# Patient Record
Sex: Female | Born: 1965 | Race: Black or African American | Hispanic: No | Marital: Single | Smoking: Never smoker
Health system: Southern US, Community
[De-identification: ages and names within clinical notes are randomized; demographics above are authoritative.]

## PROBLEM LIST (undated history)

## (undated) DIAGNOSIS — I10 Essential (primary) hypertension: Secondary | ICD-10-CM

## (undated) DIAGNOSIS — E119 Type 2 diabetes mellitus without complications: Secondary | ICD-10-CM

## (undated) DIAGNOSIS — I509 Heart failure, unspecified: Secondary | ICD-10-CM

## (undated) HISTORY — DX: Type 2 diabetes mellitus without complications: E11.9

## (undated) HISTORY — DX: Heart failure, unspecified: I50.9

## (undated) SURGERY — Surgical Case
Anesthesia: *Unknown

---

## 2012-05-02 ENCOUNTER — Telehealth (HOSPITAL_COMMUNITY): Payer: Self-pay | Admitting: Emergency Medicine

## 2012-05-02 ENCOUNTER — Emergency Department (HOSPITAL_BASED_OUTPATIENT_CLINIC_OR_DEPARTMENT_OTHER)
Admission: EM | Admit: 2012-05-02 | Discharge: 2012-05-02 | Disposition: A | Discharge status: Home / Self Care | Payer: BC Managed Care – PPO | Attending: Emergency Medicine | Admitting: Emergency Medicine

## 2012-05-02 ENCOUNTER — Encounter (HOSPITAL_BASED_OUTPATIENT_CLINIC_OR_DEPARTMENT_OTHER): Payer: Self-pay | Admitting: *Deleted

## 2012-05-02 DIAGNOSIS — Z79899 Other long term (current) drug therapy: Secondary | ICD-10-CM | POA: Insufficient documentation

## 2012-05-02 DIAGNOSIS — A6000 Herpesviral infection of urogenital system, unspecified: Secondary | ICD-10-CM

## 2012-05-02 DIAGNOSIS — I10 Essential (primary) hypertension: Secondary | ICD-10-CM | POA: Insufficient documentation

## 2012-05-02 DIAGNOSIS — E119 Type 2 diabetes mellitus without complications: Secondary | ICD-10-CM | POA: Insufficient documentation

## 2012-05-02 DIAGNOSIS — N760 Acute vaginitis: Secondary | ICD-10-CM | POA: Insufficient documentation

## 2012-05-02 DIAGNOSIS — B9689 Other specified bacterial agents as the cause of diseases classified elsewhere: Secondary | ICD-10-CM

## 2012-05-02 HISTORY — DX: Essential (primary) hypertension: I10

## 2012-05-02 HISTORY — DX: Type 2 diabetes mellitus without complications: E11.9

## 2012-05-02 LAB — WET PREP, GENITAL
Trich, Wet Prep: NONE SEEN
Yeast Wet Prep HPF POC: NONE SEEN

## 2012-05-02 LAB — GC/CHLAMYDIA PROBE AMP
CT Probe RNA: NEGATIVE
GC Probe RNA: NEGATIVE

## 2012-05-02 MED ORDER — METRONIDAZOLE 500 MG PO TABS: 500.0000 mg | ORAL_TABLET | Freq: Two times a day (BID) | ORAL | Status: DC

## 2012-05-02 MED ORDER — LIDOCAINE 4 % EX CREA: TOPICAL_CREAM | Freq: Four times a day (QID) | CUTANEOUS | Status: DC | PRN

## 2012-05-02 MED ORDER — VALACYCLOVIR HCL 1 G PO TABS: 1000.0000 mg | ORAL_TABLET | Freq: Three times a day (TID) | ORAL | Status: AC

## 2012-05-02 MED ORDER — VALACYCLOVIR HCL 500 MG PO TABS
1000.0000 mg | ORAL_TABLET | Freq: Once | ORAL | Status: DC
  Filled 2012-05-02: qty 2

## 2012-05-02 MED ORDER — LIDOCAINE-EPINEPHRINE-TETRACAINE (LET) SOLUTION
3.0000 mL | Freq: Once | NASAL | Status: AC
  Administered 2012-05-02: 3 mL via TOPICAL
  Filled 2012-05-02: qty 3

## 2012-05-02 MED ORDER — LIDOCAINE VISCOUS 2 % MT SOLN
OROMUCOSAL | Status: AC
  Filled 2012-05-02: qty 15

## 2012-05-02 NOTE — ED Notes (Addendum)
C/o right groin area "boil" that started on Friday. Pt. States she is diabetic. Denies hx of abscess. Denies drainage from wound. Denies fevers. States pain to area with urination.

## 2012-05-02 NOTE — ED Provider Notes (Signed)
History  This chart was scribed for Natasha Skene, MD by Shari Heritage, ED Scribe. The patient was seen in room MH11/MH11. Patient's care was started at 0032.   CSN: [id number]  Arrival date & time 05/02/12  0005   First MD Initiated Contact with Patient 05/02/12 0032      Chief Complaint  Patient presents with  . rash      The history is provided by the patient. No language interpreter was used.    HPI Comments: Natasha Hudson is a 47 y.o. female who presents to the Emergency Department complaining of a painful rash to the right gluteal-labial area onset 2-3 days ago. Patient describes pain as burning and rates it as 7-8/10. She denies history of the same. She has applied antibiotic ointment to the area with no relief. She has not applied a warm compress to the area, but states that pain is improved with warm shower. She states that she is sexually active and has been with the same partner for awhile. She denies fever. No vaginal discharge, vaginal bleeding, or urinary symptoms. Her medical history includes diabetes and hypertension. She does not smoke.  Past Medical History  Diagnosis Date  . Diabetes mellitus without complication   . Hypertension     History reviewed. No pertinent past surgical history.  No family history on file.  History  Substance Use Topics  . Smoking status: Never Smoker   . Smokeless tobacco: Not on file  . Alcohol Use: No    OB History   Grav Para Term Preterm Abortions TAB SAB Ect Mult Living                  Review of Systems At least 10pt or greater review of systems completed and are negative except where specified in the HPI.  Allergies  Review of patient's allergies indicates no known allergies.  Home Medications   Current Outpatient Rx  Name  Route  Sig  Dispense  Refill  . hydrochlorothiazide (HYDRODIURIL) 25 MG tablet   Oral   Take 25 mg by mouth daily.         . metFORMIN (GLUCOPHAGE) 500 MG tablet   Oral   Take 1,000  mg by mouth 2 (two) times daily with a meal.           Triage Vitals: BP 141/85  Pulse 90  Temp(Src) 98 F (36.7 C)  Resp 18  Ht 5\' 5"  (1.651 m)  Wt 140 lb (63.504 kg)  BMI 23.3 kg/m2  SpO2 100%  LMP 04/09/2012  Physical Exam  Genitourinary:       Nursing notes reviewed.  Electronic medical record reviewed. VITAL SIGNS:   Filed Vitals:   05/02/12 0020 05/02/12 0021  BP:  141/85  Pulse:  90  Temp:  98 F (36.7 C)  Resp:  18  Height: 5\' 5"  (1.651 m)   Weight: 140 lb (63.504 kg)   SpO2:  100%   CONSTITUTIONAL: Awake, oriented, appears non-toxic HENT: Atraumatic, normocephalic, oral mucosa pink and moist, airway patent. Some gold teeth.Nares patent without drainage. External ears normal. EYES: Conjunctiva clear, EOMI, PERRLA NECK: Trachea midline, non-tender, supple CARDIOVASCULAR: Normal heart rate, Normal rhythm, No murmurs, rubs, gallops PULMONARY/CHEST: Clear to auscultation, no rhonchi, wheezes, or rales. Symmetrical breath sounds. Non-tender. ABDOMINAL: Non-distended, soft, non-tender - no rebound or guarding.  BS normal. NEUROLOGIC: Non-focal, moving all four extremities, no gross sensory or motor deficits. EXTREMITIES: No clubbing, cyanosis, or edema SKIN: Warm, Dry, No erythema,  No rash PELVIC EXAM: on the external. The labia majora there are ruptured vesicles with an erythematous and painful base, normal vulva, vagina, cervix, uterus and adnexa - ovaries palpated normally-no masses, no cervical motion tenderness or adnexal tenderness appreciated.  ED Course  Procedures (including critical care time) DIAGNOSTIC STUDIES: Oxygen Saturation is 100% on room air, normal by my interpretation.    COORDINATION OF CARE: 12:37 AM- Patient informed of current plan for treatment and evaluation and agrees with plan at this time.    Labs Reviewed  WET PREP, GENITAL - Abnormal; Notable for the following:    Clue Cells Wet Prep HPF POC MANY (*)    WBC, Wet Prep HPF  POC MANY (*)    All other components within normal limits  GC/CHLAMYDIA PROBE AMP  RAPID HIV SCREEN ([facility])  RPR    No results found.   1. Genital herpes   2. BV (bacterial vaginosis)       MDM  Natasha Hudson is a 47 y.o. female History of hypertension and diabetes on oral meds presents with what she thought was an abscess to her vulvar region. Patient has an unexpected initial outbreak of genital herpes. We'll screen the patient for other sexual transmitted infections. Pelvic exam is unremarkable, wet prep does suggest possible bacterial vaginosis-patient has had some vaginal itching it is uncertain whether not this is secondary to her vulvar herpes, we'll treat her with metronidazole.    I discussed further treatments are pending her laboratory tests. We'll call her for any positive for GC chlamydia or RPR. Initial rapid HIV testing is negative. Told patient she can followup with her primary care physician if she desires any further testing.  Patient understands and accepts the medical plan as it's been dictated, she understands to take her valacyclovir 1000 mg 3 times daily for one week. Also given her some topical lidocaine cream to help with the discomfort. Instructed her to use it 4-5 times a day to not overuse it.  I explained the diagnosis and have given explicit precautions to return to the ER including any other new or worsening symptoms. The patient understands and accepts the medical plan as it's been dictated and I have answered their questions. Discharge instructions concerning home care and prescriptions have been given as dictated.  The patient is STABLE and is discharged to home in good condition.    I personally performed the services described in this documentation, which was scribed in my presence. The recorded information has been reviewed and is accurate. Natasha Hudson, M.D.     Natasha Skene, MD 05/02/12 [phone]

## 2012-05-02 NOTE — ED Notes (Signed)
This assessment was taken at 0240

## 2012-05-02 NOTE — ED Notes (Signed)
Pt presented to ED requesting a return to work note for tomorrow. Note provided.

## 2012-10-04 ENCOUNTER — Emergency Department (HOSPITAL_BASED_OUTPATIENT_CLINIC_OR_DEPARTMENT_OTHER): Payer: BC Managed Care – PPO

## 2012-10-04 ENCOUNTER — Encounter (HOSPITAL_BASED_OUTPATIENT_CLINIC_OR_DEPARTMENT_OTHER): Payer: Self-pay

## 2012-10-04 ENCOUNTER — Emergency Department (HOSPITAL_BASED_OUTPATIENT_CLINIC_OR_DEPARTMENT_OTHER)
Admission: EM | Admit: 2012-10-04 | Discharge: 2012-10-04 | Disposition: A | Discharge status: Home / Self Care | Payer: BC Managed Care – PPO | Attending: Emergency Medicine | Admitting: Emergency Medicine

## 2012-10-04 DIAGNOSIS — S8392XA Sprain of unspecified site of left knee, initial encounter: Secondary | ICD-10-CM

## 2012-10-04 DIAGNOSIS — Z79899 Other long term (current) drug therapy: Secondary | ICD-10-CM | POA: Insufficient documentation

## 2012-10-04 DIAGNOSIS — IMO0002 Reserved for concepts with insufficient information to code with codable children: Secondary | ICD-10-CM | POA: Insufficient documentation

## 2012-10-04 DIAGNOSIS — Y9301 Activity, walking, marching and hiking: Secondary | ICD-10-CM | POA: Insufficient documentation

## 2012-10-04 DIAGNOSIS — I10 Essential (primary) hypertension: Secondary | ICD-10-CM | POA: Insufficient documentation

## 2012-10-04 DIAGNOSIS — X503XXA Overexertion from repetitive movements, initial encounter: Secondary | ICD-10-CM | POA: Insufficient documentation

## 2012-10-04 DIAGNOSIS — Y929 Unspecified place or not applicable: Secondary | ICD-10-CM | POA: Insufficient documentation

## 2012-10-04 DIAGNOSIS — E119 Type 2 diabetes mellitus without complications: Secondary | ICD-10-CM | POA: Insufficient documentation

## 2012-10-04 MED ORDER — IBUPROFEN 800 MG PO TABS: 800.0000 mg | ORAL_TABLET | Freq: Three times a day (TID) | ORAL | Status: DC

## 2012-10-04 NOTE — ED Provider Notes (Signed)
History/physical exam/procedure(s) were performed by non-physician practitioner and as supervising physician I was immediately available for consultation/collaboration. I have reviewed all notes and am in agreement with care and plan.   Kerryann Allaire S Shalece Staffa, MD 10/04/12 1601 

## 2012-10-04 NOTE — ED Notes (Signed)
Patient transported to & from X-ray. 

## 2012-10-04 NOTE — ED Provider Notes (Signed)
CSN: [id number]     Arrival date & time 10/04/12  1106 History     First MD Initiated Contact with Patient 10/04/12 1205     Chief Complaint  Patient presents with  . Knee Pain   (Consider location/radiation/quality/duration/timing/severity/associated sxs/prior Treatment) Patient is a 47 y.o. female presenting with knee pain. The history is provided by the patient. No language interpreter was used.  Knee Pain Location:  Knee Time since incident:  2 hours Injury: yes   Knee location:  L knee Pain details:    Quality:  Aching   Severity:  Moderate   Onset quality:  Gradual   Timing:  Constant   Progression:  Worsening Chronicity:  New Relieved by:  Nothing Worsened by:  Nothing tried Ineffective treatments:  None tried Pt reports she heard a pop 2 days ago, now knee is swollen.   Past Medical History  Diagnosis Date  . Diabetes mellitus without complication   . Hypertension    History reviewed. No pertinent past surgical history. No family history on file. History  Substance Use Topics  . Smoking status: Never Smoker   . Smokeless tobacco: Not on file  . Alcohol Use: No   OB History   Grav Para Term Preterm Abortions TAB SAB Ect Mult Living                 Review of Systems  Musculoskeletal: Positive for myalgias and joint swelling.  All other systems reviewed and are negative.    Allergies  Review of patient's allergies indicates no known allergies.  Home Medications   Current Outpatient Rx  Name  Route  Sig  Dispense  Refill  . glyBURIDE (DIABETA) 5 MG tablet   Oral   Take 5 mg by mouth 2 (two) times daily with a meal.         . hydrochlorothiazide (HYDRODIURIL) 25 MG tablet   Oral   Take 25 mg by mouth daily.         Marland Kitchen lidocaine (LMX) 4 % cream   Topical   Apply topically 4 (four) times daily as needed. For pain   5 g   1   . metFORMIN (GLUCOPHAGE) 500 MG tablet   Oral   Take 1,000 mg by mouth 2 (two) times daily with a meal.          . metroNIDAZOLE (FLAGYL) 500 MG tablet   Oral   Take 1 tablet (500 mg total) by mouth 2 (two) times daily.   14 tablet   0    BP 146/76  Pulse 72  Temp(Src) 98.2 F (36.8 C) (Oral)  Resp 20  Ht 5\' 5"  (1.651 m)  Wt 165 lb (74.844 kg)  BMI 27.46 kg/m2  SpO2 100%  LMP 10/03/2012 Physical Exam  Nursing note and vitals reviewed. Constitutional: She is oriented to person, place, and time. She appears well-developed and well-nourished.  Musculoskeletal: She exhibits tenderness.  Swollen left knee, decreased range of motion,  nv and ns intact. No instability  Neurological: She is alert and oriented to person, place, and time. She has normal reflexes.  Skin: Skin is warm.  Psychiatric: She has a normal mood and affect.    ED Course   Procedures (including critical care time)  Labs Reviewed - No data to display No results found. 1. Knee sprain and strain, left, initial encounter     MDM  Knee imbolizer and ibuprofen,   Schedule to see Dr. Pearletha Forge for recheck  Verlon Au  Verline Lema, PA-C 10/04/12 1229  Elson Areas, PA-C 10/04/12 1229

## 2012-10-04 NOTE — ED Notes (Signed)
Pt states she felt a pop in her knee while walking Sunday. Pain and swelling in left knee. Pt states it feels worse when walking.

## 2012-10-04 NOTE — ED Notes (Signed)
C/o pain/swelling to left knee-turned and felt a pop 2 days ago-pt with steady gait into triage

## 2013-04-07 DIAGNOSIS — J302 Other seasonal allergic rhinitis: Secondary | ICD-10-CM | POA: Insufficient documentation

## 2013-11-21 DIAGNOSIS — M1712 Unilateral primary osteoarthritis, left knee: Secondary | ICD-10-CM | POA: Insufficient documentation

## 2013-12-11 DIAGNOSIS — G4730 Sleep apnea, unspecified: Secondary | ICD-10-CM | POA: Insufficient documentation

## 2014-09-09 IMAGING — CR DG KNEE 1-2V*L*
2 series · 2 of 2 positions shown · non-contrast
Comparison: None.

CLINICAL DATA: Anterior left knee pain and swelling for 3 days

LEFT KNEE - 1-2 VIEW

[t knee ap left]
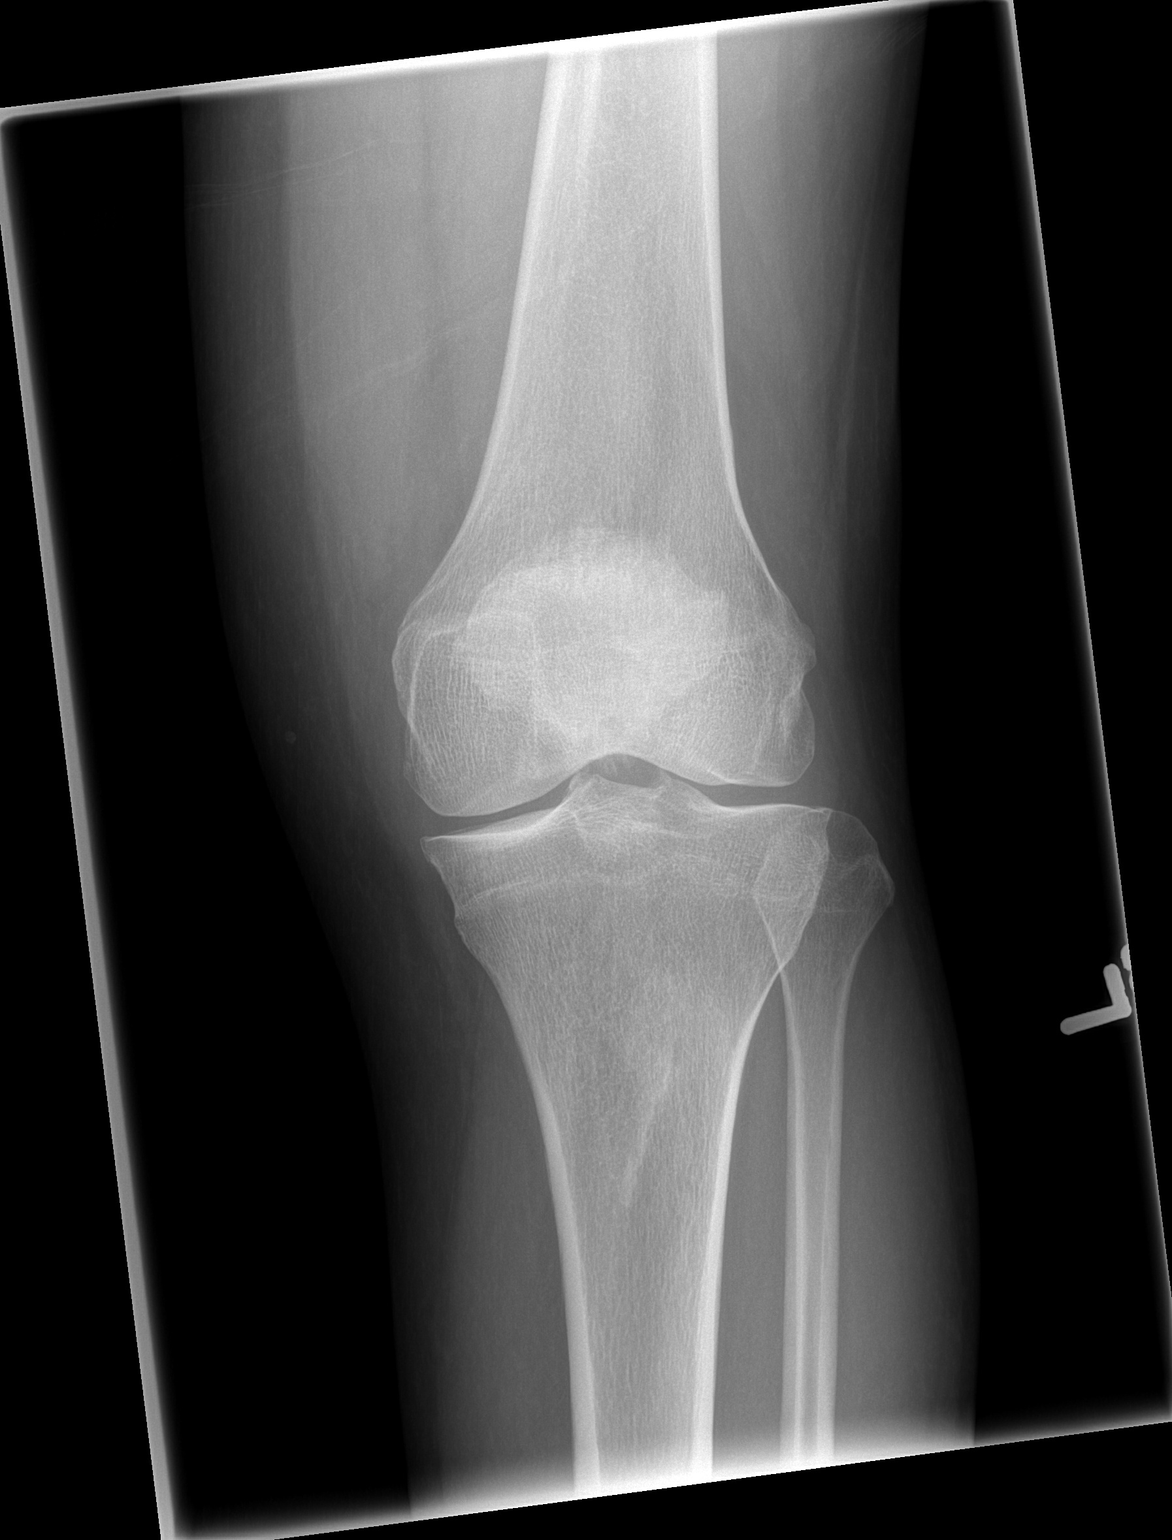

[t knee lat left]
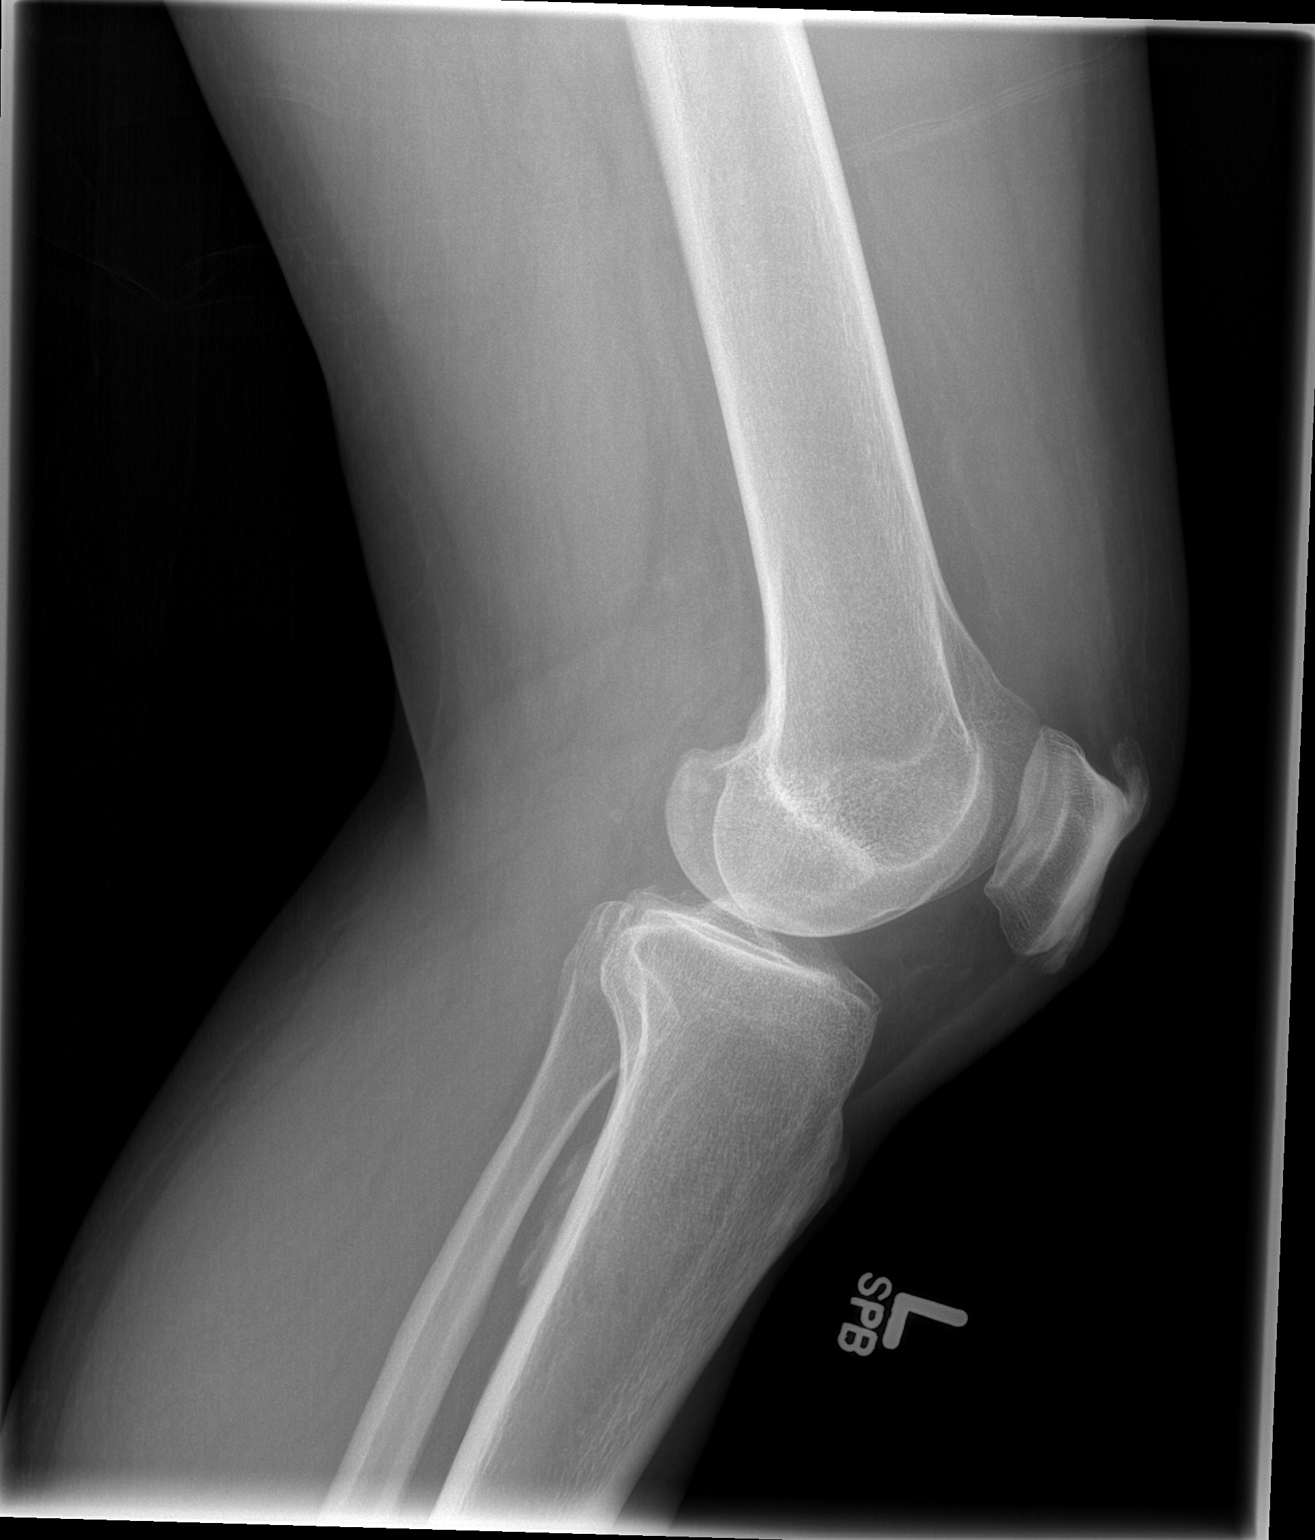

[2 of 2 positions shown; findings below may reference images not displayed]

FINDINGS: Two views of the left knee submitted.  No acute fracture
or subluxation.  Small joint effusion.  Significant spurring of the
patella.  Narrowing of patellofemoral joint space.
IMPRESSION: No acute fracture or subluxation.  Significant spurring of patella
at the insertion of the quadriceps tendon and patellar tendon.
Small joint effusion.

## 2014-11-25 DIAGNOSIS — Z91199 Patient's noncompliance with other medical treatment and regimen due to unspecified reason: Secondary | ICD-10-CM | POA: Insufficient documentation

## 2015-02-06 DIAGNOSIS — J4520 Mild intermittent asthma, uncomplicated: Secondary | ICD-10-CM | POA: Insufficient documentation

## 2015-10-25 DIAGNOSIS — L603 Nail dystrophy: Secondary | ICD-10-CM | POA: Insufficient documentation

## 2016-02-07 DIAGNOSIS — M1711 Unilateral primary osteoarthritis, right knee: Secondary | ICD-10-CM | POA: Insufficient documentation

## 2016-03-10 DIAGNOSIS — K219 Gastro-esophageal reflux disease without esophagitis: Secondary | ICD-10-CM | POA: Insufficient documentation

## 2016-03-10 DIAGNOSIS — R490 Dysphonia: Secondary | ICD-10-CM | POA: Insufficient documentation

## 2016-11-03 DIAGNOSIS — J383 Other diseases of vocal cords: Secondary | ICD-10-CM | POA: Insufficient documentation

## 2018-04-18 DIAGNOSIS — Z0000 Encounter for general adult medical examination without abnormal findings: Secondary | ICD-10-CM | POA: Insufficient documentation

## 2019-01-09 DIAGNOSIS — E559 Vitamin D deficiency, unspecified: Secondary | ICD-10-CM | POA: Insufficient documentation

## 2019-05-14 ENCOUNTER — Ambulatory Visit: Payer: 59 | Attending: Internal Medicine

## 2019-05-14 DIAGNOSIS — Z23 Encounter for immunization: Secondary | ICD-10-CM | POA: Insufficient documentation

## 2019-05-14 NOTE — Progress Notes (Signed)
   Covid-19 Vaccination Clinic  Name:  Carrieann Spielberg    MRN: [id number] DOB: 1965/07/12  05/14/2019  Ms. Shepherd was observed post Covid-19 immunization for 15 minutes without incident. She was provided with Vaccine Information Sheet and instruction to access the V-Safe system.   Ms. Vaca was instructed to call 911 with any severe reactions post vaccine: Marland Kitchen Difficulty breathing  . Swelling of face and throat  . A fast heartbeat  . A bad rash all over body  . Dizziness and weakness   Immunizations Administered    Name Date Dose VIS Date Route   Pfizer COVID-19 Vaccine 05/14/2019  4:45 PM 0.3 mL 02/17/2019 Intramuscular   Manufacturer: [employer], [employer]   Lot: [id number]   NDC: [address]-8

## 2019-06-13 ENCOUNTER — Ambulatory Visit: Payer: 59 | Attending: Internal Medicine

## 2019-06-13 DIAGNOSIS — Z23 Encounter for immunization: Secondary | ICD-10-CM

## 2019-06-13 NOTE — Progress Notes (Signed)
   Covid-19 Vaccination Clinic  Name:  Natasha Hudson    MRN: [id number] DOB: [birth year]  06/13/2019  Natasha Hudson was observed post Covid-19 immunization for 15 minutes without incident. She was provided with Vaccine Information Sheet and instruction to access the V-Safe system.   Natasha Hudson was instructed to call 911 with any severe reactions post vaccine: Marland Kitchen Difficulty breathing  . Swelling of face and throat  . A fast heartbeat  . A bad rash all over body  . Dizziness and weakness   Immunizations Administered    Name Date Dose VIS Date Route   Pfizer COVID-19 Vaccine 06/13/2019  3:20 PM 0.3 mL 02/17/2019 Intramuscular   Manufacturer: [employer], [employer]   Lot: [id number]   NDC: [address]-9

## 2020-03-15 ENCOUNTER — Ambulatory Visit: Payer: 59

## 2020-08-13 ENCOUNTER — Emergency Department (HOSPITAL_BASED_OUTPATIENT_CLINIC_OR_DEPARTMENT_OTHER)
Admission: EM | Admit: 2020-08-13 | Discharge: 2020-08-13 | Disposition: A | Discharge status: Home / Self Care | Payer: BC Managed Care – PPO | Attending: Emergency Medicine | Admitting: Emergency Medicine

## 2020-08-13 ENCOUNTER — Emergency Department (HOSPITAL_BASED_OUTPATIENT_CLINIC_OR_DEPARTMENT_OTHER): Payer: BC Managed Care – PPO

## 2020-08-13 ENCOUNTER — Encounter (HOSPITAL_BASED_OUTPATIENT_CLINIC_OR_DEPARTMENT_OTHER): Payer: Self-pay | Admitting: Emergency Medicine

## 2020-08-13 ENCOUNTER — Other Ambulatory Visit: Payer: Self-pay

## 2020-08-13 DIAGNOSIS — Z7984 Long term (current) use of oral hypoglycemic drugs: Secondary | ICD-10-CM | POA: Insufficient documentation

## 2020-08-13 DIAGNOSIS — E119 Type 2 diabetes mellitus without complications: Secondary | ICD-10-CM | POA: Insufficient documentation

## 2020-08-13 DIAGNOSIS — S92514A Nondisplaced fracture of proximal phalanx of right lesser toe(s), initial encounter for closed fracture: Secondary | ICD-10-CM | POA: Insufficient documentation

## 2020-08-13 DIAGNOSIS — I10 Essential (primary) hypertension: Secondary | ICD-10-CM | POA: Insufficient documentation

## 2020-08-13 DIAGNOSIS — Z79899 Other long term (current) drug therapy: Secondary | ICD-10-CM | POA: Insufficient documentation

## 2020-08-13 DIAGNOSIS — W228XXA Striking against or struck by other objects, initial encounter: Secondary | ICD-10-CM | POA: Diagnosis not present

## 2020-08-13 DIAGNOSIS — S99921A Unspecified injury of right foot, initial encounter: Secondary | ICD-10-CM | POA: Diagnosis present

## 2020-08-13 MED ORDER — IBUPROFEN 400 MG PO TABS
400.0000 mg | ORAL_TABLET | Freq: Once | ORAL | Status: AC
  Administered 2020-08-13: 400 mg via ORAL
  Filled 2020-08-13: qty 1

## 2020-08-13 MED ORDER — IBUPROFEN 800 MG PO TABS: 800.0000 mg | ORAL_TABLET | Freq: Three times a day (TID) | ORAL | 0 refills | Status: DC

## 2020-08-13 NOTE — ED Triage Notes (Signed)
Pt states she kicked into a chair with her right foot injuring her 5th toe on Thursday  Pt states it is still painful and swollen

## 2020-08-13 NOTE — ED Provider Notes (Signed)
[facility] EMERGENCY DEPARTMENT Provider Note   CSN: [id number] Arrival date & time: 08/13/20  0202     History Chief Complaint  Patient presents with  . Toe Injury    Natasha Hudson is a 55 y.o. female.   Toe Pain This is a new problem. The current episode started more than 2 days ago. The problem occurs constantly. The problem has not changed since onset.Pertinent negatives include no chest pain, no abdominal pain, no headaches and no shortness of breath. Nothing aggravates the symptoms. Nothing relieves the symptoms. She has tried nothing for the symptoms.       Past Medical History:  Diagnosis Date  . Diabetes mellitus without complication (HCC)   . Hypertension     There are no problems to display for this patient.   History reviewed. No pertinent surgical history.   OB History   No obstetric history on file.     Family History  Problem Relation Age of Onset  . Diabetes Other   . Hypertension Other     Social History   Tobacco Use  . Smoking status: Never Smoker  . Smokeless tobacco: Never Used  Vaping Use  . Vaping Use: Never used  Substance Use Topics  . Alcohol use: No  . Drug use: No    Home Medications Prior to Admission medications   Medication Sig Start Date End Date Taking? Authorizing Provider  ibuprofen (ADVIL) 800 MG tablet Take 1 tablet (800 mg total) by mouth 3 (three) times daily. 08/13/20  Yes Bobbyjo Marulanda, Barbara Cower, MD  glyBURIDE (DIABETA) 5 MG tablet Take 5 mg by mouth 2 (two) times daily with a meal.    [provider]  hydrochlorothiazide (HYDRODIURIL) 25 MG tablet Take 25 mg by mouth daily.    [provider]  lidocaine (LMX) 4 % cream Apply topically 4 (four) times daily as needed. For pain 05/02/12   Bonk, John-Adam, MD  metFORMIN (GLUCOPHAGE) 500 MG tablet Take 1,000 mg by mouth 2 (two) times daily with a meal.    [provider]  metroNIDAZOLE (FLAGYL) 500 MG tablet Take 1 tablet (500 mg total) by  mouth 2 (two) times daily. 05/02/12   Jones Skene, MD    Allergies    Patient has no known allergies.  Review of Systems   Review of Systems  Respiratory: Negative for shortness of breath.   Cardiovascular: Negative for chest pain.  Gastrointestinal: Negative for abdominal pain.  Neurological: Negative for headaches.  All other systems reviewed and are negative.   Physical Exam Updated Vital Signs BP (!) 155/90 (BP Location: Left Arm)   Pulse 82   Temp 98.1 F (36.7 C) (Oral)   Resp 14   Ht 5' 5.5" (1.664 m)   Wt 68 kg   LMP 10/03/2012   SpO2 98%   BMI 24.58 kg/m   Physical Exam Vitals and nursing note reviewed.  Constitutional:      Appearance: She is well-developed.  HENT:     Head: Normocephalic and atraumatic.     Mouth/Throat:     Mouth: Mucous membranes are moist.     Pharynx: Oropharynx is clear.  Eyes:     Pupils: Pupils are equal, round, and reactive to light.  Cardiovascular:     Rate and Rhythm: Normal rate and regular rhythm.  Pulmonary:     Effort: No respiratory distress.     Breath sounds: No stridor.  Abdominal:     General: Abdomen is flat. There is  no distension.  Musculoskeletal:        General: Swelling (right small toe) and tenderness present. Normal range of motion.     Cervical back: Normal range of motion.  Neurological:     General: No focal deficit present.     Mental Status: She is alert.     ED Results / Procedures / Treatments   Labs (all labs ordered are listed, but only abnormal results are displayed) Labs Reviewed - No data to display  EKG None  Radiology DG Foot Complete Right  Result Date: 08/13/2020 CLINICAL DATA:  Right fifth toe injury and pain EXAM: RIGHT FOOT COMPLETE - 3+ VIEW COMPARISON:  None. FINDINGS: Three view radiograph of the right foot demonstrates an acute, mildly comminuted intra-articular fracture of the base of the right fifth proximal phalanx with fracture fragments in grossly anatomic  alignment. The articular surface of the proximal phalanx appears congruent. No other fracture identified. Joint spaces are preserved. Postsurgical changes of right bunionectomy are identified. Small superior calcaneal spur. IMPRESSION: Mildly comminuted intra-articular fracture of the base of the right fifth proximal phalanx with fracture fragments in anatomic alignment. Electronically Signed   By: Helyn Numbers MD   On: 08/13/2020 03:53    Procedures Procedures   Medications Ordered in ED Medications  ibuprofen (ADVIL) tablet 400 mg (400 mg Oral Given 08/13/20 5366)    ED Course  I have reviewed the triage vital signs and the nursing notes.  Pertinent labs & imaging results that were available during my care of the patient were reviewed by me and considered in my medical decision making (see chart for details).    MDM Rules/Calculators/A&P                         eval for broken toe.   On my review of xray, does appear to have non displaced proximal phalanx fracture. Will buddy tape, post op shoe. PCP follow up.  Radiology agrees.   Final Clinical Impression(s) / ED Diagnoses Final diagnoses:  Closed nondisplaced fracture of proximal phalanx of lesser toe of right foot, initial encounter    Rx / DC Orders ED Discharge Orders         Ordered    ibuprofen (ADVIL) 800 MG tablet  3 times daily        08/13/20 0402           Yanni Quiroa, Barbara Cower, MD 08/13/20 0405

## 2021-07-28 ENCOUNTER — Encounter (INDEPENDENT_AMBULATORY_CARE_PROVIDER_SITE_OTHER): Payer: Self-pay

## 2021-08-14 ENCOUNTER — Telehealth (INDEPENDENT_AMBULATORY_CARE_PROVIDER_SITE_OTHER): Payer: Self-pay

## 2021-08-14 NOTE — Telephone Encounter (Signed)
Referring MD/PCP: Margo Aye  Procedure: Tcs  Reason/Indication:  Screening, Fam Hx of Colon Ca  Has patient had this procedure before?  No   If so, when, by whom and where?    Is there a family history of colon cancer?  Yes  Who?  What age when diagnosed?  Nephew  Is patient diabetic? If yes, Type 1 or Type 2   no       Does patient have prosthetic heart valve or mechanical valve?  No   Do you have a pacemaker/defibrillator?  no  Has patient ever had endocarditis/atrial fibrillation? no  Does patient use oxygen? no  Has patient had joint replacement within last 12 months?  no  Is patient constipated or do they take laxatives? No   Does patient have a history of alcohol/drug use?  No   Have you had a stroke/heart attack last 6 mths? No   Do you take medicine for weight loss?  no  For female patients,: have you had a hysterectomy no                      are you post menopausal no                      do you still have your menstrual cycle yes every 8 mon  Is patient on blood thinner such as Coumadin, Plavix and/or Aspirin? No   Medications: levothyroxine 137 mcg daily, atorvastatin 20 mg daily  Allergies: nkda  Medication Adjustment per Dr Levon Hedger none  Procedure date & time:

## 2022-07-19 IMAGING — DX DG FOOT COMPLETE 3+V*R*
3 series · 3 of 3 positions shown · non-contrast
Comparison: None.

CLINICAL DATA: Right fifth toe injury and pain

EXAM:
RIGHT FOOT COMPLETE - 3+ VIEW

[foot ap]
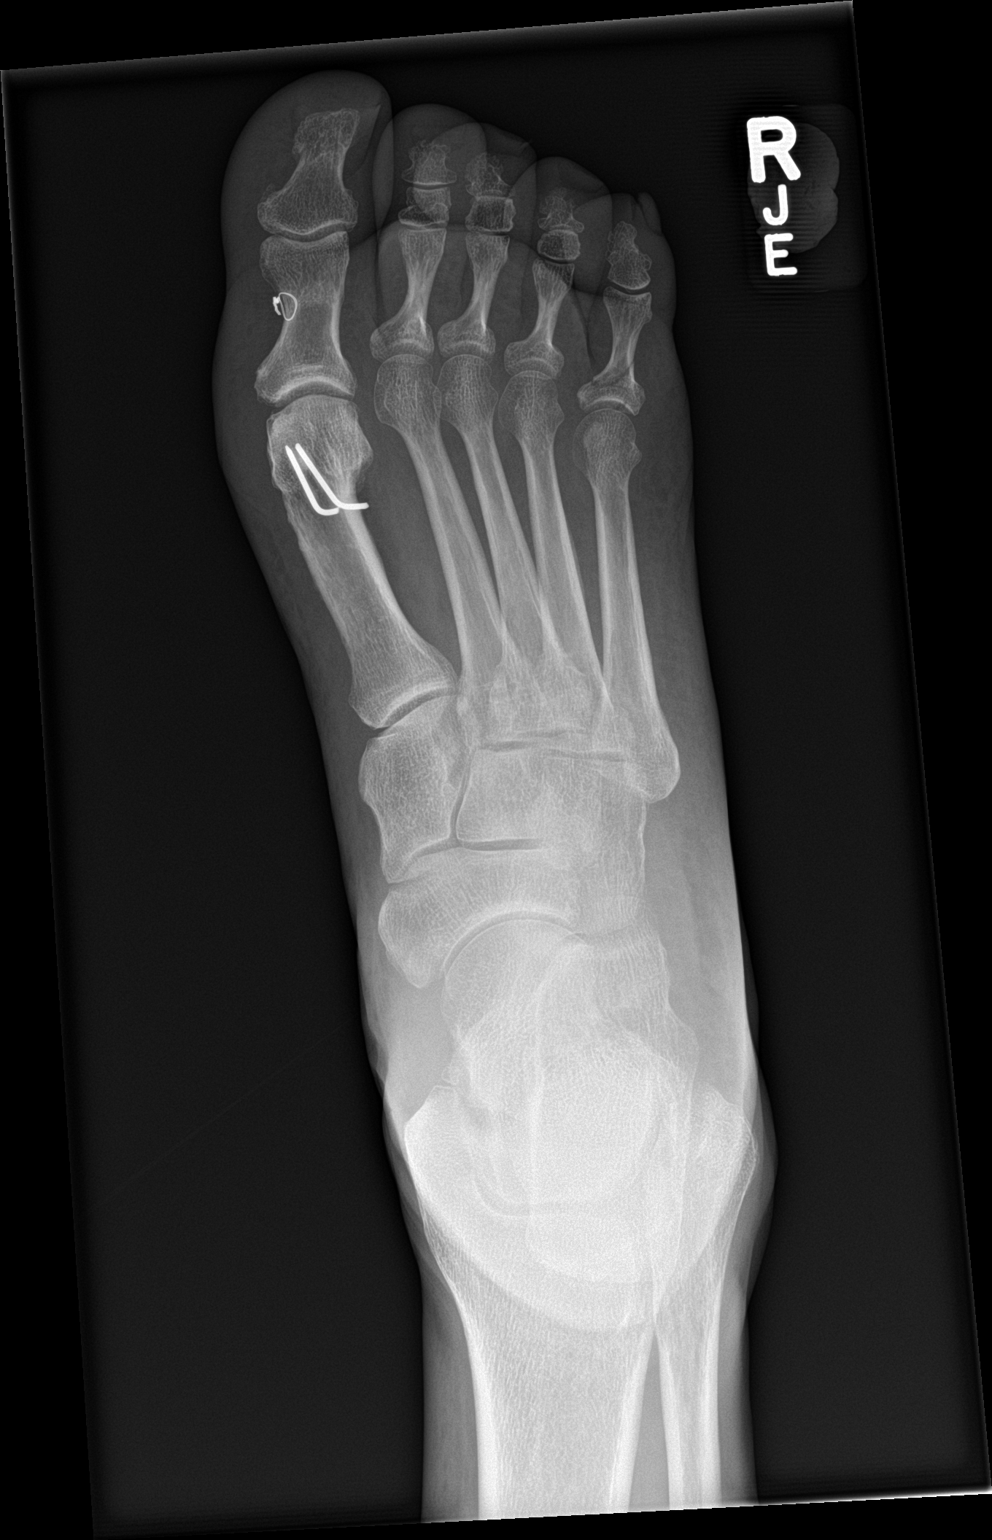

[foot obl]
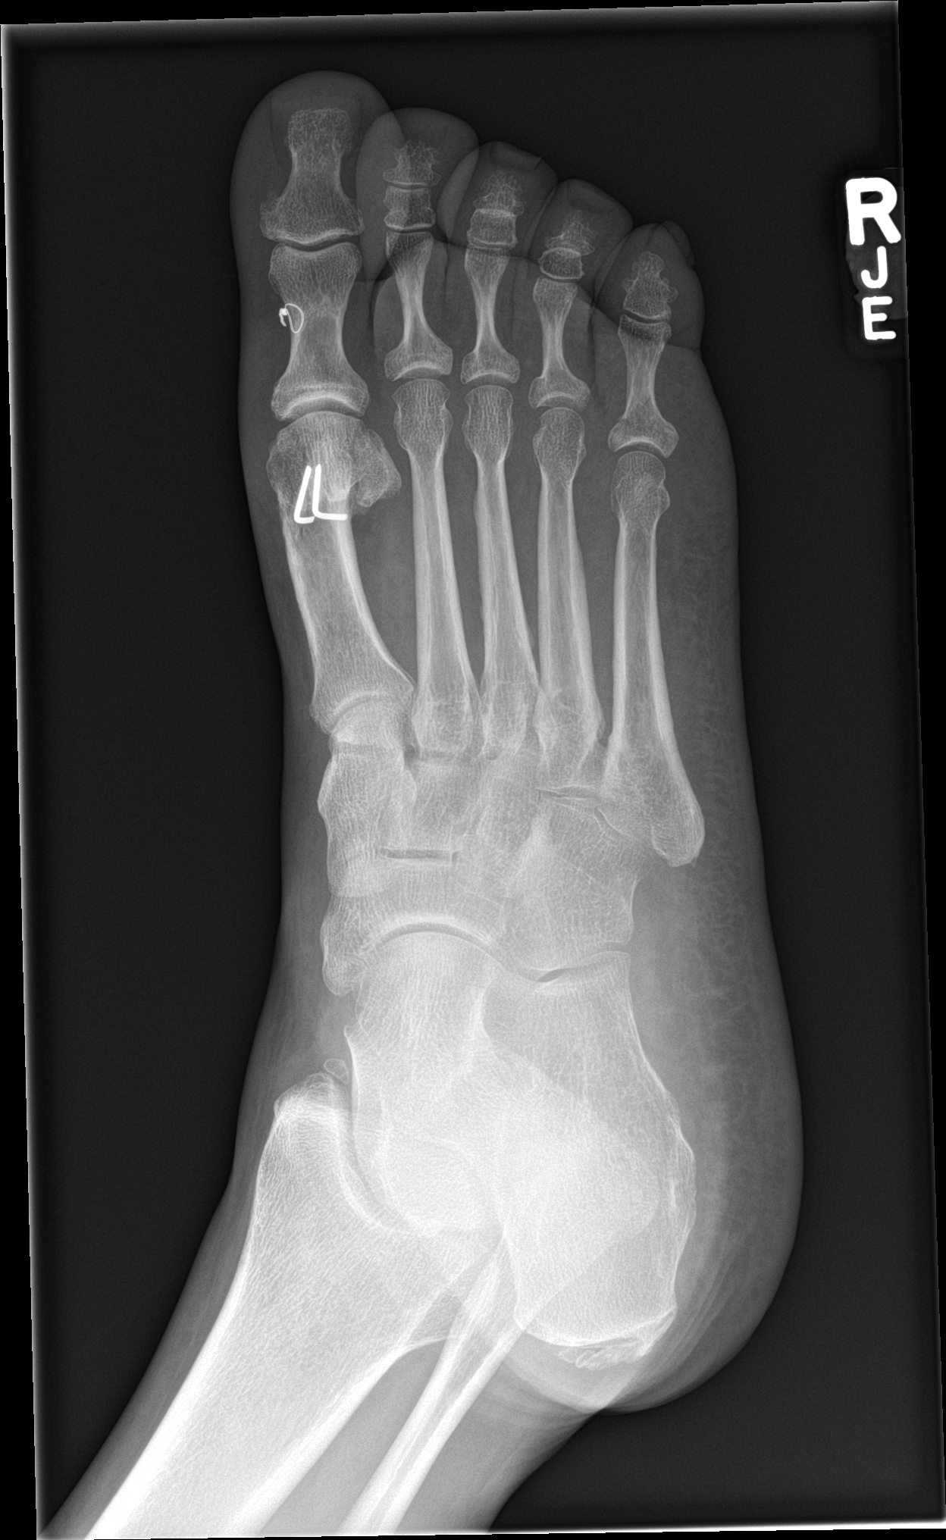

[foot lat]
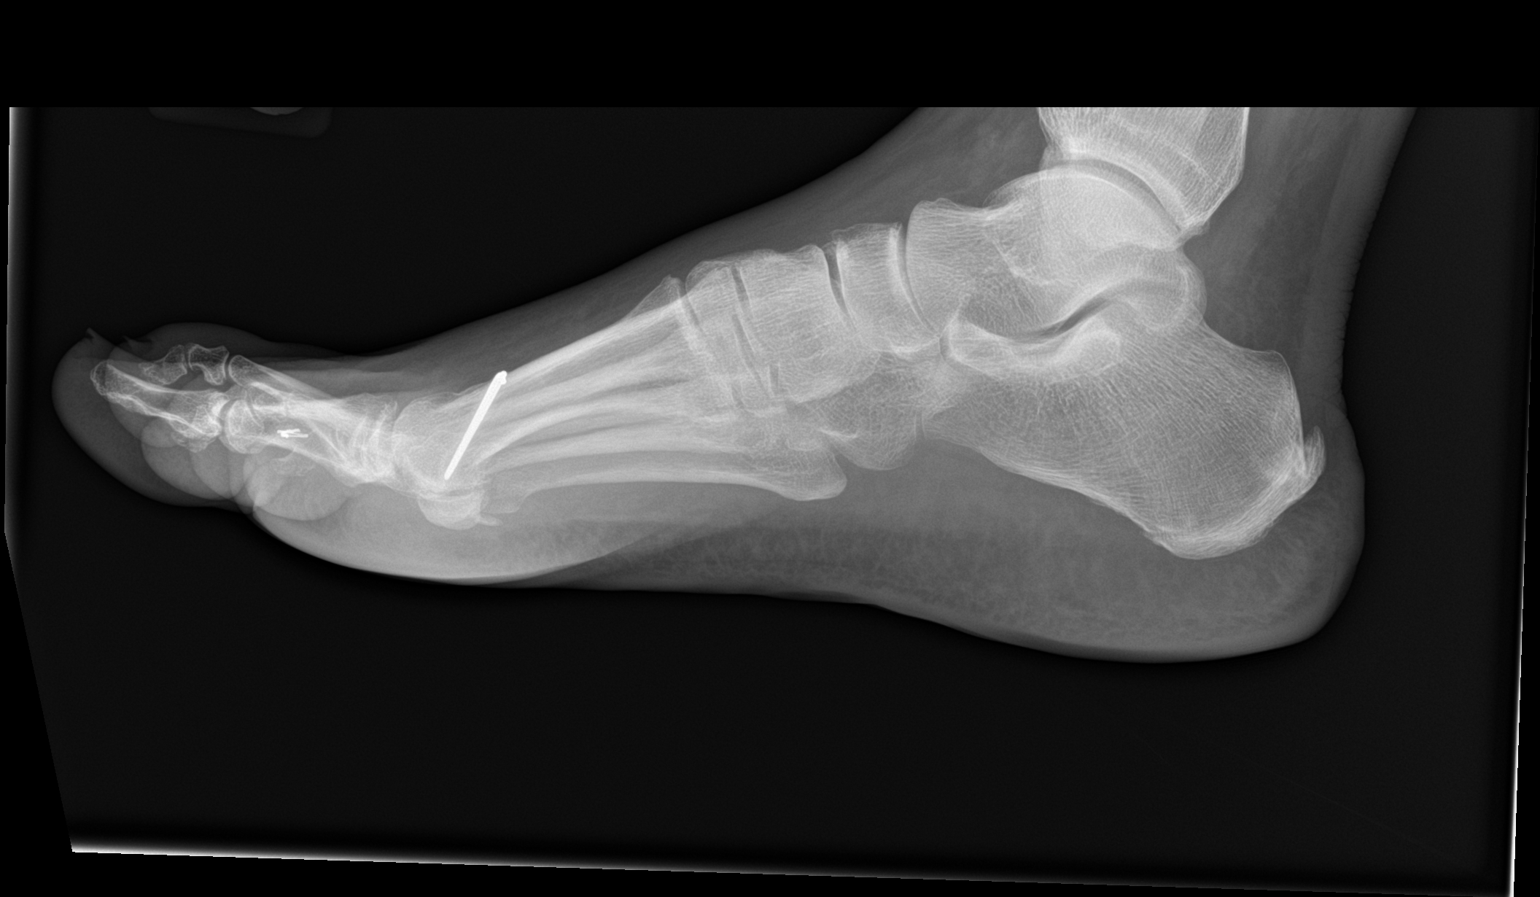

[3 of 3 positions shown; findings below may reference images not displayed]

FINDINGS: Three view radiograph of the right foot demonstrates an acute,
mildly comminuted intra-articular fracture of the base of the right
fifth proximal phalanx with fracture fragments in grossly anatomic
alignment. The articular surface of the proximal phalanx appears
congruent. No other fracture identified. Joint spaces are preserved.
Postsurgical changes of right bunionectomy are identified. Small
superior calcaneal spur.
IMPRESSION: Mildly comminuted intra-articular fracture of the base of the right
fifth proximal phalanx with fracture fragments in anatomic
alignment.

## 2023-07-17 ENCOUNTER — Other Ambulatory Visit: Payer: Self-pay

## 2023-07-17 ENCOUNTER — Emergency Department (HOSPITAL_BASED_OUTPATIENT_CLINIC_OR_DEPARTMENT_OTHER)

## 2023-07-17 ENCOUNTER — Emergency Department (HOSPITAL_BASED_OUTPATIENT_CLINIC_OR_DEPARTMENT_OTHER)
Admission: EM | Admit: 2023-07-17 | Discharge: 2023-07-17 | Disposition: A | Discharge status: Home / Self Care | Source: Home / Self Care | Attending: Emergency Medicine | Admitting: Emergency Medicine

## 2023-07-17 ENCOUNTER — Encounter (HOSPITAL_BASED_OUTPATIENT_CLINIC_OR_DEPARTMENT_OTHER): Payer: Self-pay

## 2023-07-17 DIAGNOSIS — J189 Pneumonia, unspecified organism: Secondary | ICD-10-CM | POA: Insufficient documentation

## 2023-07-17 DIAGNOSIS — R739 Hyperglycemia, unspecified: Secondary | ICD-10-CM | POA: Insufficient documentation

## 2023-07-17 DIAGNOSIS — R0602 Shortness of breath: Secondary | ICD-10-CM | POA: Diagnosis not present

## 2023-07-17 DIAGNOSIS — A419 Sepsis, unspecified organism: Secondary | ICD-10-CM | POA: Diagnosis not present

## 2023-07-17 LAB — CBC
HCT: 39 % (ref 36.0–46.0)
Hemoglobin: 12.8 g/dL (ref 12.0–15.0)
MCH: 27.6 pg (ref 26.0–34.0)
MCHC: 32.8 g/dL (ref 30.0–36.0)
MCV: 84.1 fL (ref 80.0–100.0)
Platelets: 424 10*3/uL — ABNORMAL HIGH (ref 150–400)
RBC: 4.64 MIL/uL (ref 3.87–5.11)
RDW: 13.3 % (ref 11.5–15.5)
WBC: 7.5 10*3/uL (ref 4.0–10.5)
nRBC: 0 % (ref 0.0–0.2)

## 2023-07-17 LAB — RESP PANEL BY RT-PCR (RSV, FLU A&B, COVID)  RVPGX2
Influenza A by PCR: NEGATIVE
Influenza B by PCR: NEGATIVE
Resp Syncytial Virus by PCR: NEGATIVE
SARS Coronavirus 2 by RT PCR: NEGATIVE

## 2023-07-17 LAB — BASIC METABOLIC PANEL WITH GFR
Anion gap: 11 (ref 5–15)
BUN: 7 mg/dL (ref 6–20)
CO2: 25 mmol/L (ref 22–32)
Calcium: 9.4 mg/dL (ref 8.9–10.3)
Chloride: 99 mmol/L (ref 98–111)
Creatinine, Ser: 0.63 mg/dL (ref 0.44–1.00)
GFR, Estimated: >60 mL/min (ref >60)
Glucose, Bld: 357 mg/dL — ABNORMAL HIGH (ref 70–99)
Potassium: 3.9 mmol/L (ref 3.5–5.1)
Sodium: 135 mmol/L (ref 135–145)

## 2023-07-17 LAB — TROPONIN T, HIGH SENSITIVITY: Troponin T High Sensitivity: <15 ng/L (ref <19)

## 2023-07-17 LAB — CBG MONITORING, ED: Glucose-Capillary: 263 mg/dL — ABNORMAL HIGH (ref 70–99)

## 2023-07-17 MED ORDER — INSULIN REGULAR HUMAN 100 UNIT/ML IJ SOLN: 4.0000 [IU] | Freq: Once | INTRAMUSCULAR | Status: DC

## 2023-07-17 MED ORDER — AZITHROMYCIN 250 MG PO TABS: 250.0000 mg | ORAL_TABLET | Freq: Every day | ORAL | 0 refills | Status: DC

## 2023-07-17 MED ORDER — SODIUM CHLORIDE 0.9 % IV BOLUS
1000.0000 mL | Freq: Once | INTRAVENOUS | Status: AC
  Administered 2023-07-17: 1000 mL via INTRAVENOUS

## 2023-07-17 MED ORDER — INSULIN ASPART 100 UNIT/ML IJ SOLN
4.0000 [IU] | Freq: Once | INTRAMUSCULAR | Status: AC
  Administered 2023-07-17: 4 [IU] via INTRAVENOUS

## 2023-07-17 NOTE — ED Notes (Signed)
 Reviewed discharge instructions, follow up and medications with pt. Pt states understanding. Ambulatory at discharge

## 2023-07-17 NOTE — ED Provider Notes (Signed)
 [place] [facility] Provider Note   CSN: [id number] Arrival date & time: 07/17/23  7829     History  Chief Complaint  Patient presents with   Shortness of Breath    Natasha Hudson is a 58 y.o. female.   Shortness of Breath Patient had shortness of breath and cough.  Has had for around 2 weeks now.  Worse last few days.  Cough without sputum production.  States more short of breath.  More difficulty walking.  No chest pains.  No fevers.  No definite sick contacts.  No smoker.  No history of lung diseases.     Home Medications Prior to Admission medications   Medication Sig Start Date End Date Taking? Authorizing Provider  azithromycin (ZITHROMAX) 250 MG tablet Take 1 tablet (250 mg total) by mouth daily. Take first 2 tablets together, then 1 every day until finished. 07/17/23  Yes Mozell Arias, MD  glyBURIDE (DIABETA) 5 MG tablet Take 5 mg by mouth 2 (two) times daily with a meal.    [provider]  hydrochlorothiazide (HYDRODIURIL) 25 MG tablet Take 25 mg by mouth daily.    [provider]  ibuprofen  (ADVIL ) 800 MG tablet Take 1 tablet (800 mg total) by mouth 3 (three) times daily. 08/13/20   Mesner, Reymundo Caulk, MD  lidocaine  (LMX) 4 % cream Apply topically 4 (four) times daily as needed. For pain 05/02/12   Bonk, John-Adam, MD  metFORMIN (GLUCOPHAGE) 500 MG tablet Take 1,000 mg by mouth 2 (two) times daily with a meal.    [provider]  metroNIDAZOLE  (FLAGYL ) 500 MG tablet Take 1 tablet (500 mg total) by mouth 2 (two) times daily. 05/02/12   Cathryne Cobble, MD      Allergies    Patient has no known allergies.    Review of Systems   Review of Systems  Respiratory:  Positive for shortness of breath.     Physical Exam Updated Vital Signs BP (!) 162/101   Pulse (!) 102   Temp 98.2 F (36.8 C) (Oral)   Resp 16   Wt 70.3 kg   LMP 10/03/2012   SpO2 98%   BMI 25.40 kg/m  Physical Exam Vitals and nursing  note reviewed.  Cardiovascular:     Rate and Rhythm: Regular rhythm.  Pulmonary:     Comments: Mildly harsh breath sounds without focal rales or rhonchi. Chest:     Chest wall: No tenderness.  Musculoskeletal:     Right lower leg: No edema.     Left lower leg: No edema.  Neurological:     Mental Status: She is alert.     ED Results / Procedures / Treatments   Labs (all labs ordered are listed, but only abnormal results are displayed) Labs Reviewed  BASIC METABOLIC PANEL WITH GFR - Abnormal; Notable for the following components:      Result Value   Glucose, Bld 357 (*)    All other components within normal limits  CBC - Abnormal; Notable for the following components:   Platelets 424 (*)    All other components within normal limits  CBG MONITORING, ED - Abnormal; Notable for the following components:   Glucose-Capillary 263 (*)    All other components within normal limits  RESP PANEL BY RT-PCR (RSV, FLU A&B, COVID)  [id number]  TROPONIN T, HIGH SENSITIVITY    EKG EKG Interpretation Date/Time:  Saturday Jul 17 2023 08:14:29 EDT Ventricular Rate:  102 PR Interval:  140 QRS Duration:  136 QT Interval:  382 QTC Calculation: 497 R Axis:   84  Text Interpretation: Sinus tachycardia Biatrial enlargement Non-specific intra-ventricular conduction block Cannot rule out Septal infarct , age undetermined T wave abnormality, consider inferolateral ischemia Abnormal ECG No previous ECGs available Confirmed by Mozell Arias [phone]) on 07/17/2023 8:33:57 AM  Radiology DG Chest 2 View Result Date: 07/17/2023 CLINICAL DATA:  Shortness of breath.  Nonproductive cough. EXAM: CHEST - 2 VIEW COMPARISON:  None Available. FINDINGS: Generalized interstitial opacity with fissure thickening. Heart size is normal. Unremarkable mediastinal contours. No significant pleural fluid. No pneumothorax. IMPRESSION: Interstitial opacity usually from edema but atypical infection could also give a similar  appearance. Electronically Signed   By: Ronnette Coke M.D.   On: 07/17/2023 09:40    Procedures Procedures    Medications Ordered in ED Medications  insulin aspart (novoLOG) injection 4 Units (has no administration in time range)  sodium chloride 0.9 % bolus 1,000 mL (1,000 mLs Intravenous New Bag/Given 07/17/23 1000)    ED Course/ Medical Decision Making/ A&P                                 Medical Decision Making Amount and/or Complexity of Data Reviewed Labs: ordered. Radiology: ordered.  Risk Prescription drug management.   Patient shortness of breath and cough.  2 weeks of symptoms.  Differential diagnose includes cause such as viral infection, pneumonia.  Also somewhat abnormal EKG but no old to compare.  Will get x-ray and blood work.  X-ray shows atypical pneumonia versus potentially edema.  However with the cough and no peripheral edema I think more likely infectious.  Negative viral testing.  Does have hyperglycemia without DKA.  Given some insulin but is on oral medicine at home.  Will treat with antibiotics for the pneumonia.  Appears stable for discharge home.         Final Clinical Impression(s) / ED Diagnoses Final diagnoses:  Atypical pneumonia  Hyperglycemia    Rx / DC Orders ED Discharge Orders          Ordered    azithromycin (ZITHROMAX) 250 MG tablet  Daily        07/17/23 1023              Mozell Arias, MD 07/17/23 1024

## 2023-07-17 NOTE — ED Triage Notes (Signed)
 Pt reports SOB began Monday, worse with exertion. Coughing since previous week. Non productive. Cough worse at night. Denies swelling in lower extremities.  No Chest pain

## 2023-07-17 NOTE — ED Notes (Signed)
 Pt reports Improvement in symptoms

## 2023-07-18 ENCOUNTER — Emergency Department (HOSPITAL_BASED_OUTPATIENT_CLINIC_OR_DEPARTMENT_OTHER)

## 2023-07-18 ENCOUNTER — Other Ambulatory Visit: Payer: Self-pay

## 2023-07-18 ENCOUNTER — Inpatient Hospital Stay (HOSPITAL_BASED_OUTPATIENT_CLINIC_OR_DEPARTMENT_OTHER)
Admission: EM | Admit: 2023-07-18 | Discharge: 2023-07-26 | DRG: 871 | Disposition: A | Discharge status: Home / Self Care | Attending: Internal Medicine | Admitting: Internal Medicine

## 2023-07-18 ENCOUNTER — Encounter (HOSPITAL_BASED_OUTPATIENT_CLINIC_OR_DEPARTMENT_OTHER): Payer: Self-pay | Admitting: Emergency Medicine

## 2023-07-18 DIAGNOSIS — I447 Left bundle-branch block, unspecified: Secondary | ICD-10-CM

## 2023-07-18 DIAGNOSIS — Z1152 Encounter for screening for COVID-19: Secondary | ICD-10-CM

## 2023-07-18 DIAGNOSIS — E1165 Type 2 diabetes mellitus with hyperglycemia: Secondary | ICD-10-CM | POA: Diagnosis present

## 2023-07-18 DIAGNOSIS — I428 Other cardiomyopathies: Secondary | ICD-10-CM

## 2023-07-18 DIAGNOSIS — I110 Hypertensive heart disease with heart failure: Secondary | ICD-10-CM | POA: Diagnosis present

## 2023-07-18 DIAGNOSIS — I340 Nonrheumatic mitral (valve) insufficiency: Secondary | ICD-10-CM

## 2023-07-18 DIAGNOSIS — E119 Type 2 diabetes mellitus without complications: Secondary | ICD-10-CM

## 2023-07-18 DIAGNOSIS — J9601 Acute respiratory failure with hypoxia: Principal | ICD-10-CM | POA: Diagnosis present

## 2023-07-18 DIAGNOSIS — Z8249 Family history of ischemic heart disease and other diseases of the circulatory system: Secondary | ICD-10-CM

## 2023-07-18 DIAGNOSIS — J189 Pneumonia, unspecified organism: Secondary | ICD-10-CM | POA: Diagnosis present

## 2023-07-18 DIAGNOSIS — A419 Sepsis, unspecified organism: Principal | ICD-10-CM | POA: Diagnosis present

## 2023-07-18 DIAGNOSIS — R040 Epistaxis: Secondary | ICD-10-CM | POA: Diagnosis not present

## 2023-07-18 DIAGNOSIS — E782 Mixed hyperlipidemia: Secondary | ICD-10-CM

## 2023-07-18 DIAGNOSIS — R262 Difficulty in walking, not elsewhere classified: Secondary | ICD-10-CM | POA: Diagnosis present

## 2023-07-18 DIAGNOSIS — J1289 Other viral pneumonia: Secondary | ICD-10-CM | POA: Diagnosis present

## 2023-07-18 DIAGNOSIS — Z79899 Other long term (current) drug therapy: Secondary | ICD-10-CM

## 2023-07-18 DIAGNOSIS — I429 Cardiomyopathy, unspecified: Secondary | ICD-10-CM

## 2023-07-18 DIAGNOSIS — I5189 Other ill-defined heart diseases: Secondary | ICD-10-CM

## 2023-07-18 DIAGNOSIS — I272 Pulmonary hypertension, unspecified: Secondary | ICD-10-CM | POA: Diagnosis present

## 2023-07-18 DIAGNOSIS — I513 Intracardiac thrombosis, not elsewhere classified: Secondary | ICD-10-CM | POA: Diagnosis present

## 2023-07-18 DIAGNOSIS — B348 Other viral infections of unspecified site: Secondary | ICD-10-CM

## 2023-07-18 DIAGNOSIS — I5021 Acute systolic (congestive) heart failure: Secondary | ICD-10-CM

## 2023-07-18 DIAGNOSIS — E876 Hypokalemia: Secondary | ICD-10-CM | POA: Diagnosis present

## 2023-07-18 DIAGNOSIS — R931 Abnormal findings on diagnostic imaging of heart and coronary circulation: Secondary | ICD-10-CM | POA: Diagnosis present

## 2023-07-18 DIAGNOSIS — Z7984 Long term (current) use of oral hypoglycemic drugs: Secondary | ICD-10-CM

## 2023-07-18 DIAGNOSIS — Z833 Family history of diabetes mellitus: Secondary | ICD-10-CM

## 2023-07-18 DIAGNOSIS — I10 Essential (primary) hypertension: Secondary | ICD-10-CM | POA: Insufficient documentation

## 2023-07-18 LAB — I-STAT VENOUS BLOOD GAS, ED
Acid-Base Excess: 6 mmol/L — ABNORMAL HIGH (ref 0.0–2.0)
Bicarbonate: 31 mmol/L — ABNORMAL HIGH (ref 20.0–28.0)
Calcium, Ion: 1.19 mmol/L (ref 1.15–1.40)
HCT: 40 % (ref 36.0–46.0)
Hemoglobin: 13.6 g/dL (ref 12.0–15.0)
O2 Saturation: 66 %
Patient temperature: 100.4
Potassium: 3.5 mmol/L (ref 3.5–5.1)
Sodium: 134 mmol/L — ABNORMAL LOW (ref 135–145)
TCO2: 32 mmol/L (ref 22–32)
pCO2, Ven: 45.5 mmHg (ref 44–60)
pH, Ven: 7.445 — ABNORMAL HIGH (ref 7.25–7.43)
pO2, Ven: 35 mmHg (ref 32–45)

## 2023-07-18 LAB — COMPREHENSIVE METABOLIC PANEL WITH GFR
ALT: 71 U/L — ABNORMAL HIGH (ref 0–44)
AST: 37 U/L (ref 15–41)
Albumin: 3.5 g/dL (ref 3.5–5.0)
Alkaline Phosphatase: 83 U/L (ref 38–126)
Anion gap: 15 (ref 5–15)
BUN: <5 mg/dL — ABNORMAL LOW (ref 6–20)
CO2: 25 mmol/L (ref 22–32)
Calcium: 9.3 mg/dL (ref 8.9–10.3)
Chloride: 96 mmol/L — ABNORMAL LOW (ref 98–111)
Creatinine, Ser: 0.59 mg/dL (ref 0.44–1.00)
GFR, Estimated: >60 mL/min (ref >60)
Glucose, Bld: 302 mg/dL — ABNORMAL HIGH (ref 70–99)
Potassium: 3.6 mmol/L (ref 3.5–5.1)
Sodium: 135 mmol/L (ref 135–145)
Total Bilirubin: 0.5 mg/dL (ref 0.0–1.2)
Total Protein: 6.6 g/dL (ref 6.5–8.1)

## 2023-07-18 LAB — PRO BRAIN NATRIURETIC PEPTIDE: Pro Brain Natriuretic Peptide: 2515 pg/mL — ABNORMAL HIGH (ref <300.0)

## 2023-07-18 LAB — CBC WITH DIFFERENTIAL/PLATELET
Abs Immature Granulocytes: 0.06 10*3/uL (ref 0.00–0.07)
Basophils Absolute: 0 10*3/uL (ref 0.0–0.1)
Basophils Relative: 0 %
Eosinophils Absolute: 0 10*3/uL (ref 0.0–0.5)
Eosinophils Relative: 0 %
HCT: 40 % (ref 36.0–46.0)
Hemoglobin: 12.9 g/dL (ref 12.0–15.0)
Immature Granulocytes: 1 %
Lymphocytes Relative: 8 %
Lymphs Abs: 1 10*3/uL (ref 0.7–4.0)
MCH: 27.2 pg (ref 26.0–34.0)
MCHC: 32.3 g/dL (ref 30.0–36.0)
MCV: 84.2 fL (ref 80.0–100.0)
Monocytes Absolute: 0.9 10*3/uL (ref 0.1–1.0)
Monocytes Relative: 8 %
Neutro Abs: 9.9 10*3/uL — ABNORMAL HIGH (ref 1.7–7.7)
Neutrophils Relative %: 83 %
Platelets: 406 10*3/uL — ABNORMAL HIGH (ref 150–400)
RBC: 4.75 MIL/uL (ref 3.87–5.11)
RDW: 13.4 % (ref 11.5–15.5)
WBC: 11.9 10*3/uL — ABNORMAL HIGH (ref 4.0–10.5)
nRBC: 0 % (ref 0.0–0.2)

## 2023-07-18 LAB — TROPONIN T, HIGH SENSITIVITY: Troponin T High Sensitivity: 15 ng/L (ref <19)

## 2023-07-18 LAB — LACTIC ACID, PLASMA: Lactic Acid, Venous: 1.6 mmol/L (ref 0.5–1.9)

## 2023-07-18 MED ORDER — IOHEXOL 350 MG/ML SOLN
75.0000 mL | Freq: Once | INTRAVENOUS | Status: AC | PRN
  Administered 2023-07-18: 75 mL via INTRAVENOUS

## 2023-07-18 MED ORDER — ACETAMINOPHEN 500 MG PO TABS
1000.0000 mg | ORAL_TABLET | Freq: Once | ORAL | Status: AC
  Administered 2023-07-18: 1000 mg via ORAL
  Filled 2023-07-18: qty 2

## 2023-07-18 MED ORDER — FUROSEMIDE 10 MG/ML IJ SOLN
40.0000 mg | Freq: Once | INTRAMUSCULAR | Status: AC
  Administered 2023-07-18: 40 mg via INTRAVENOUS
  Filled 2023-07-18: qty 4

## 2023-07-18 NOTE — ED Provider Notes (Signed)
 [place] [facility] Provider Note  CSN: [id number] Arrival date & time: 07/18/23 2059  Chief Complaint(s) Shortness of Breath  HPI Natasha Hudson is a 58 y.o. female history of diabetes, hypertension presenting to the emergency department shortness of breath.  Patient was seen yesterday for shortness of breath and cough, x-ray showed possible pneumonia, was discharged with antibiotics.  Reports taking these, however symptoms worsened today.  She reports associated orthopnea, cough with some clear sputum.  Had not noticed fevers at home.  Had noticed some recent leg swelling which is new for her.  No chest pain.  No abdominal pain.  No nausea or vomiting.  No lightheadedness or dizziness.   Past Medical History Past Medical History:  Diagnosis Date   Diabetes mellitus without complication (HCC)    Hypertension    There are no active problems to display for this patient.  Home Medication(s) Prior to Admission medications   Medication Sig Start Date End Date Taking? Authorizing Provider  azithromycin (ZITHROMAX) 250 MG tablet Take 1 tablet (250 mg total) by mouth daily. Take first 2 tablets together, then 1 every day until finished. 07/17/23   Mozell Arias, MD  glyBURIDE (DIABETA) 5 MG tablet Take 5 mg by mouth 2 (two) times daily with a meal.    [provider]  hydrochlorothiazide (HYDRODIURIL) 25 MG tablet Take 25 mg by mouth daily.    [provider]  ibuprofen  (ADVIL ) 800 MG tablet Take 1 tablet (800 mg total) by mouth 3 (three) times daily. 08/13/20   Mesner, Reymundo Caulk, MD  lidocaine  (LMX) 4 % cream Apply topically 4 (four) times daily as needed. For pain 05/02/12   Bonk, John-Adam, MD  metFORMIN (GLUCOPHAGE) 500 MG tablet Take 1,000 mg by mouth 2 (two) times daily with a meal.    [provider]  metroNIDAZOLE  (FLAGYL ) 500 MG tablet Take 1 tablet (500 mg total) by mouth 2 (two) times daily. 05/02/12   Cathryne Cobble, MD                                                                                                                                     Past Surgical History History reviewed. No pertinent surgical history. Family History Family History  Problem Relation Age of Onset   Diabetes Other    Hypertension Other     Social History Social History   Tobacco Use   Smoking status: Never   Smokeless tobacco: Never  Vaping Use   Vaping status: Never Used  Substance Use Topics   Alcohol use: No   Drug use: No   Allergies Patient has no known allergies.  Review of Systems Review of Systems  All other systems reviewed and are negative.   Physical Exam Vital Signs  I have reviewed the triage vital signs BP (!) 147/94   Pulse (!) 109   Temp (!) 100.4 F (38 C)  Resp 20   Ht 5\' 5"  (1.651 m)   Wt 70.3 kg   LMP 10/03/2012   SpO2 91%   BMI 25.79 kg/m  Physical Exam Vitals and nursing note reviewed.  Constitutional:      General: She is not in acute distress.    Appearance: She is well-developed.  HENT:     Head: Normocephalic and atraumatic.     Mouth/Throat:     Mouth: Mucous membranes are moist.  Eyes:     Pupils: Pupils are equal, round, and reactive to light.  Neck:     Vascular: Hepatojugular reflux and JVD present.  Cardiovascular:     Rate and Rhythm: Normal rate and regular rhythm.     Heart sounds: No murmur heard. Pulmonary:     Effort: Pulmonary effort is normal. No respiratory distress.     Breath sounds: Examination of the right-lower field reveals rales. Examination of the left-lower field reveals rales. Rales present.  Abdominal:     General: Abdomen is flat.     Palpations: Abdomen is soft.     Tenderness: There is no abdominal tenderness.  Musculoskeletal:        General: No tenderness.     Right lower leg: Edema present.     Left lower leg: Edema present.  Skin:    General: Skin is warm and dry.  Neurological:     General: No focal deficit present.      Mental Status: She is alert. Mental status is at baseline.  Psychiatric:        Mood and Affect: Mood normal.        Behavior: Behavior normal.     ED Results and Treatments Labs (all labs ordered are listed, but only abnormal results are displayed) Labs Reviewed  COMPREHENSIVE METABOLIC PANEL WITH GFR - Abnormal; Notable for the following components:      Result Value   Chloride 96 (*)    Glucose, Bld 302 (*)    BUN <5 (*)    ALT 71 (*)    All other components within normal limits  PRO BRAIN NATRIURETIC PEPTIDE - Abnormal; Notable for the following components:   Pro Brain Natriuretic Peptide 2,515.0 (*)    All other components within normal limits  CBC WITH DIFFERENTIAL/PLATELET - Abnormal; Notable for the following components:   WBC 11.9 (*)    Platelets 406 (*)    Neutro Abs 9.9 (*)    All other components within normal limits  I-STAT VENOUS BLOOD GAS, ED - Abnormal; Notable for the following components:   pH, Ven 7.445 (*)    Bicarbonate 31.0 (*)    Acid-Base Excess 6.0 (*)    Sodium 134 (*)    All other components within normal limits  CULTURE, BLOOD (ROUTINE X 2)  CULTURE, BLOOD (ROUTINE X 2)  LACTIC ACID, PLASMA  CBC WITH DIFFERENTIAL/PLATELET  LACTIC ACID, PLASMA  CBC WITH DIFFERENTIAL/PLATELET  TROPONIN T, HIGH SENSITIVITY  Radiology DG Chest Port 1 View Result Date: 07/18/2023 CLINICAL DATA:  Shortness of breath.  Question pneumonia. EXAM: PORTABLE CHEST 1 VIEW COMPARISON:  Radiograph yesterday FINDINGS: Worsening bibasilar opacities with increasing pleural effusions and bibasilar airspace disease. Diffuse bronchial and interstitial thickening. Stable heart size and mediastinal contours. No pneumothorax. IMPRESSION: 1. Worsening bibasilar opacities with increasing pleural effusions and bibasilar airspace disease. This may represent compressive  atelectasis or pneumonia. 2. Diffuse bronchial and interstitial thickening, favor pulmonary edema. Electronically Signed   By: Chadwick Colonel M.D.   On: 07/18/2023 23:07    Pertinent labs & imaging results that were available during my care of the patient were reviewed by me and considered in my medical decision making (see MDM for details).  Medications Ordered in ED Medications  iohexol (OMNIPAQUE) 350 MG/ML injection 75 mL (has no administration in time range)  acetaminophen (TYLENOL) tablet 1,000 mg (1,000 mg Oral Given 07/18/23 2218)  furosemide (LASIX) injection 40 mg (40 mg Intravenous Given 07/18/23 2258)                                                                                                                                     Procedures .Critical Care  Performed by: Mordecai Applebaum, MD Authorized by: Mordecai Applebaum, MD   Critical care provider statement:    Critical care time (minutes):  30   Critical care was necessary to treat or prevent imminent or life-threatening deterioration of the following conditions:  Cardiac failure and respiratory failure   Critical care was time spent personally by me on the following activities:  Development of treatment plan with patient or surrogate, discussions with consultants, evaluation of patient's response to treatment, examination of patient, ordering and review of laboratory studies, ordering and review of radiographic studies, ordering and performing treatments and interventions, pulse oximetry, re-evaluation of patient's condition and review of old charts Ultrasound ED Echo  Date/Time: 07/18/2023 10:48 PM  Performed by: Mordecai Applebaum, MD Authorized by: Mordecai Applebaum, MD   Procedure details:    Indications: dyspnea     Views: parasternal long axis view, parasternal short axis view and IVC view     Images: archived   Findings:    Pericardium: no pericardial effusion     LV Function: depressed (30 - 50%)      RV Diameter: normal     IVC: dilated   Impression:    Impression: probable elevated CVP and decreased contractility     (including critical care time)  Medical Decision Making / ED Course   MDM:  58 year old presenting to the emergency department with shortness of breath.  Patient mildly ill-appearing, no acute distress.  Was hypoxic on room air, to 86%.  Placed on oxygen with improvement.  Suspect symptoms are probably more likely related to CHF.  She has JVD, lower extremity swelling, orthopnea.  Has crackles on exam.  Bedside echocardiogram shows diminished  EF.  Will give Lasix.  She denies any history of similar.  Will need admission.  She does have a fever, so differential does include pneumonia, will follow-up chest x-ray report although on my interpretation appears to show worsening pulmonary edema as compared to previous.  Will check blood cultures.  Given tachycardia and hypoxia I will check PE study of the chest.  This will also better evaluate lung parenchyma to assess for any underlying pneumonia.  No chest pain to suggest ACS but will check troponin.  Will reassess.  Clinical Course as of 07/18/23 2318  Paulene Boron Jul 18, 2023  2313 Signed out to Dr. Monique Ano pending CTA chest, likely admit given acute CHF/hypoxia [WS]    Clinical Course User Index [WS] Mordecai Applebaum, MD     Additional history obtained:  -External records from outside source obtained and reviewed including: Chart review including previous notes, labs, imaging, consultation notes including recent ER visit    Lab Tests: -I ordered, reviewed, and interpreted labs.   The pertinent results include:   Labs Reviewed  COMPREHENSIVE METABOLIC PANEL WITH GFR - Abnormal; Notable for the following components:      Result Value   Chloride 96 (*)    Glucose, Bld 302 (*)    BUN <5 (*)    ALT 71 (*)    All other components within normal limits  PRO BRAIN NATRIURETIC PEPTIDE - Abnormal; Notable for the following  components:   Pro Brain Natriuretic Peptide 2,515.0 (*)    All other components within normal limits  CBC WITH DIFFERENTIAL/PLATELET - Abnormal; Notable for the following components:   WBC 11.9 (*)    Platelets 406 (*)    Neutro Abs 9.9 (*)    All other components within normal limits  I-STAT VENOUS BLOOD GAS, ED - Abnormal; Notable for the following components:   pH, Ven 7.445 (*)    Bicarbonate 31.0 (*)    Acid-Base Excess 6.0 (*)    Sodium 134 (*)    All other components within normal limits  CULTURE, BLOOD (ROUTINE X 2)  CULTURE, BLOOD (ROUTINE X 2)  LACTIC ACID, PLASMA  CBC WITH DIFFERENTIAL/PLATELET  LACTIC ACID, PLASMA  CBC WITH DIFFERENTIAL/PLATELET  TROPONIN T, HIGH SENSITIVITY    Notable for elevate BNP. Normal troponin   Imaging Studies ordered: I ordered imaging studies including CXR On my interpretation imaging demonstrates pulmonary edema  I independently visualized and interpreted imaging. I agree with the radiologist interpretation   Medicines ordered and prescription drug management: Meds ordered this encounter  Medications   acetaminophen (TYLENOL) tablet 1,000 mg   furosemide (LASIX) injection 40 mg   iohexol (OMNIPAQUE) 350 MG/ML injection 75 mL    -I have reviewed the patients home medicines and have made adjustments as needed  Reevaluation: After the interventions noted above, I reevaluated the patient and found that their symptoms have improved  Co morbidities that complicate the patient evaluation  Past Medical History:  Diagnosis Date   Diabetes mellitus without complication (HCC)    Hypertension       Dispostion: Disposition decision including need for hospitalization was considered, and patient disposition pending at time of sign out.    Final Clinical Impression(s) / ED Diagnoses Final diagnoses:  Acute hypoxic respiratory failure (HCC)  Acute systolic congestive heart failure (HCC)     This chart was dictated using voice  recognition software.  Despite best efforts to proofread,  errors can occur which can change the documentation meaning.  Mordecai Applebaum, MD 07/18/23 2318

## 2023-07-18 NOTE — ED Provider Notes (Signed)
  Sob for one week.  Fever started tonight No chest pain

## 2023-07-18 NOTE — ED Triage Notes (Signed)
 Pt c/o worsening SHOB- was seen yesterday and dx with PNA- O2 sats 86% on RA in triage; placed on 2L O2 - sats up to 98%

## 2023-07-19 ENCOUNTER — Observation Stay (HOSPITAL_BASED_OUTPATIENT_CLINIC_OR_DEPARTMENT_OTHER)

## 2023-07-19 ENCOUNTER — Encounter (HOSPITAL_COMMUNITY): Payer: Self-pay

## 2023-07-19 DIAGNOSIS — J9601 Acute respiratory failure with hypoxia: Principal | ICD-10-CM

## 2023-07-19 DIAGNOSIS — R931 Abnormal findings on diagnostic imaging of heart and coronary circulation: Secondary | ICD-10-CM | POA: Diagnosis not present

## 2023-07-19 DIAGNOSIS — I272 Pulmonary hypertension, unspecified: Secondary | ICD-10-CM | POA: Diagnosis not present

## 2023-07-19 DIAGNOSIS — E876 Hypokalemia: Secondary | ICD-10-CM | POA: Diagnosis not present

## 2023-07-19 DIAGNOSIS — E782 Mixed hyperlipidemia: Secondary | ICD-10-CM | POA: Diagnosis not present

## 2023-07-19 DIAGNOSIS — I10 Essential (primary) hypertension: Secondary | ICD-10-CM | POA: Insufficient documentation

## 2023-07-19 DIAGNOSIS — J189 Pneumonia, unspecified organism: Secondary | ICD-10-CM | POA: Diagnosis not present

## 2023-07-19 DIAGNOSIS — I513 Intracardiac thrombosis, not elsewhere classified: Secondary | ICD-10-CM | POA: Diagnosis not present

## 2023-07-19 DIAGNOSIS — R262 Difficulty in walking, not elsewhere classified: Secondary | ICD-10-CM | POA: Diagnosis not present

## 2023-07-19 DIAGNOSIS — Z7984 Long term (current) use of oral hypoglycemic drugs: Secondary | ICD-10-CM | POA: Diagnosis not present

## 2023-07-19 DIAGNOSIS — I340 Nonrheumatic mitral (valve) insufficiency: Secondary | ICD-10-CM | POA: Diagnosis not present

## 2023-07-19 DIAGNOSIS — R0602 Shortness of breath: Secondary | ICD-10-CM | POA: Diagnosis present

## 2023-07-19 DIAGNOSIS — I428 Other cardiomyopathies: Secondary | ICD-10-CM | POA: Diagnosis not present

## 2023-07-19 DIAGNOSIS — I110 Hypertensive heart disease with heart failure: Secondary | ICD-10-CM | POA: Diagnosis not present

## 2023-07-19 DIAGNOSIS — Z79899 Other long term (current) drug therapy: Secondary | ICD-10-CM | POA: Diagnosis not present

## 2023-07-19 DIAGNOSIS — R040 Epistaxis: Secondary | ICD-10-CM | POA: Diagnosis not present

## 2023-07-19 DIAGNOSIS — J1289 Other viral pneumonia: Secondary | ICD-10-CM | POA: Diagnosis not present

## 2023-07-19 DIAGNOSIS — I5021 Acute systolic (congestive) heart failure: Secondary | ICD-10-CM | POA: Diagnosis not present

## 2023-07-19 DIAGNOSIS — I447 Left bundle-branch block, unspecified: Secondary | ICD-10-CM | POA: Diagnosis not present

## 2023-07-19 DIAGNOSIS — A419 Sepsis, unspecified organism: Secondary | ICD-10-CM | POA: Diagnosis not present

## 2023-07-19 DIAGNOSIS — Z8249 Family history of ischemic heart disease and other diseases of the circulatory system: Secondary | ICD-10-CM | POA: Diagnosis not present

## 2023-07-19 DIAGNOSIS — E119 Type 2 diabetes mellitus without complications: Secondary | ICD-10-CM | POA: Diagnosis not present

## 2023-07-19 DIAGNOSIS — Z833 Family history of diabetes mellitus: Secondary | ICD-10-CM | POA: Diagnosis not present

## 2023-07-19 DIAGNOSIS — Z1152 Encounter for screening for COVID-19: Secondary | ICD-10-CM | POA: Diagnosis not present

## 2023-07-19 DIAGNOSIS — E1165 Type 2 diabetes mellitus with hyperglycemia: Secondary | ICD-10-CM | POA: Diagnosis not present

## 2023-07-19 LAB — CBC
HCT: 41.3 % (ref 36.0–46.0)
Hemoglobin: 12.6 g/dL (ref 12.0–15.0)
MCH: 27.2 pg (ref 26.0–34.0)
MCHC: 30.5 g/dL (ref 30.0–36.0)
MCV: 89.2 fL (ref 80.0–100.0)
Platelets: 355 10*3/uL (ref 150–400)
RBC: 4.63 MIL/uL (ref 3.87–5.11)
RDW: 13.4 % (ref 11.5–15.5)
WBC: 11.8 10*3/uL — ABNORMAL HIGH (ref 4.0–10.5)
nRBC: 0 % (ref 0.0–0.2)

## 2023-07-19 LAB — GLUCOSE, CAPILLARY
Glucose-Capillary: 293 mg/dL — ABNORMAL HIGH (ref 70–99)
Glucose-Capillary: 342 mg/dL — ABNORMAL HIGH (ref 70–99)
Glucose-Capillary: 227 mg/dL — ABNORMAL HIGH (ref 70–99)
Glucose-Capillary: 183 mg/dL — ABNORMAL HIGH (ref 70–99)

## 2023-07-19 LAB — ECHOCARDIOGRAM COMPLETE
Area-P 1/2: 7.7 cm2
Calc EF: 27.2 %
Height: 65 in
MV M vel: 5.73 m/s
MV Peak grad: 131.3 mmHg
MV VTI: 2.16 cm2
Radius: 0.7 cm
S' Lateral: 4.1 cm
Single Plane A2C EF: 25.2 %
Single Plane A4C EF: 28.1 %
Weight: 2480.02 [oz_av]

## 2023-07-19 LAB — PROCALCITONIN: Procalcitonin: <0.1 ng/mL

## 2023-07-19 LAB — HIV ANTIBODY (ROUTINE TESTING W REFLEX): HIV Screen 4th Generation wRfx: NONREACTIVE

## 2023-07-19 MED ORDER — DOXYCYCLINE HYCLATE 100 MG PO TABS
100.0000 mg | ORAL_TABLET | Freq: Once | ORAL | Status: AC
  Administered 2023-07-19: 100 mg via ORAL
  Filled 2023-07-19: qty 1

## 2023-07-19 MED ORDER — PERFLUTREN LIPID MICROSPHERE
1.0000 mL | INTRAVENOUS | Status: AC | PRN
  Administered 2023-07-19: 2 mL via INTRAVENOUS

## 2023-07-19 MED ORDER — INSULIN ASPART 100 UNIT/ML IJ SOLN
0.0000 [IU] | Freq: Every day | INTRAMUSCULAR | Status: DC
  Administered 2023-07-20: 2 [IU] via SUBCUTANEOUS
  Administered 2023-07-22: 1 [IU] via SUBCUTANEOUS
  Administered 2023-07-25: 3 [IU] via SUBCUTANEOUS

## 2023-07-19 MED ORDER — ACETAMINOPHEN 325 MG PO TABS
650.0000 mg | ORAL_TABLET | Freq: Four times a day (QID) | ORAL | Status: DC | PRN
  Administered 2023-07-19 – 2023-07-23 (×2): 650 mg via ORAL
  Filled 2023-07-19 (×2): qty 2

## 2023-07-19 MED ORDER — ENOXAPARIN SODIUM 40 MG/0.4ML IJ SOSY
40.0000 mg | PREFILLED_SYRINGE | INTRAMUSCULAR | Status: DC
  Administered 2023-07-19 – 2023-07-20 (×2): 40 mg via SUBCUTANEOUS
  Filled 2023-07-19 (×2): qty 0.4

## 2023-07-19 MED ORDER — INSULIN ASPART 100 UNIT/ML IJ SOLN
0.0000 [IU] | Freq: Three times a day (TID) | INTRAMUSCULAR | Status: DC
  Administered 2023-07-19: 3 [IU] via SUBCUTANEOUS
  Administered 2023-07-19: 4 [IU] via SUBCUTANEOUS
  Administered 2023-07-19: 2 [IU] via SUBCUTANEOUS
  Administered 2023-07-20: 1 [IU] via SUBCUTANEOUS
  Administered 2023-07-20 (×2): 4 [IU] via SUBCUTANEOUS
  Administered 2023-07-22 – 2023-07-24 (×3): 1 [IU] via SUBCUTANEOUS

## 2023-07-19 MED ORDER — ONDANSETRON HCL 4 MG PO TABS: 4.0000 mg | ORAL_TABLET | Freq: Four times a day (QID) | ORAL | Status: DC | PRN

## 2023-07-19 MED ORDER — SODIUM CHLORIDE 0.9% FLUSH
3.0000 mL | Freq: Two times a day (BID) | INTRAVENOUS | Status: DC
  Administered 2023-07-21 – 2023-07-23 (×3): 3 mL via INTRAVENOUS

## 2023-07-19 MED ORDER — INSULIN GLARGINE-YFGN 100 UNIT/ML ~~LOC~~ SOLN
10.0000 [IU] | Freq: Every day | SUBCUTANEOUS | Status: DC
  Administered 2023-07-19 – 2023-07-20 (×2): 10 [IU] via SUBCUTANEOUS
  Filled 2023-07-19 (×2): qty 0.1

## 2023-07-19 MED ORDER — SODIUM CHLORIDE 0.9 % IV SOLN
1.0000 g | Freq: Once | INTRAVENOUS | Status: AC
  Administered 2023-07-19: 1 g via INTRAVENOUS
  Filled 2023-07-19: qty 10

## 2023-07-19 MED ORDER — ACETAMINOPHEN 650 MG RE SUPP: 650.0000 mg | Freq: Four times a day (QID) | RECTAL | Status: DC | PRN

## 2023-07-19 MED ORDER — SODIUM CHLORIDE 0.9% FLUSH: 3.0000 mL | INTRAVENOUS | Status: DC | PRN

## 2023-07-19 MED ORDER — SODIUM CHLORIDE 0.9% FLUSH
3.0000 mL | Freq: Two times a day (BID) | INTRAVENOUS | Status: DC
  Administered 2023-07-19 – 2023-07-25 (×11): 3 mL via INTRAVENOUS

## 2023-07-19 MED ORDER — DOXYCYCLINE HYCLATE 100 MG IV SOLR
100.0000 mg | Freq: Two times a day (BID) | INTRAVENOUS | Status: DC
  Administered 2023-07-19 – 2023-07-20 (×2): 100 mg via INTRAVENOUS
  Filled 2023-07-19 (×3): qty 100

## 2023-07-19 MED ORDER — SODIUM CHLORIDE 0.9 % IV SOLN: 250.0000 mL | INTRAVENOUS | Status: AC | PRN

## 2023-07-19 MED ORDER — ALBUTEROL SULFATE (2.5 MG/3ML) 0.083% IN NEBU
2.5000 mg | INHALATION_SOLUTION | RESPIRATORY_TRACT | Status: DC | PRN
  Administered 2023-07-19: 2.5 mg via RESPIRATORY_TRACT
  Filled 2023-07-19: qty 3

## 2023-07-19 MED ORDER — FUROSEMIDE 10 MG/ML IJ SOLN
20.0000 mg | Freq: Two times a day (BID) | INTRAMUSCULAR | Status: DC
  Administered 2023-07-19 – 2023-07-21 (×6): 20 mg via INTRAVENOUS
  Filled 2023-07-19 (×6): qty 2

## 2023-07-19 MED ORDER — SODIUM CHLORIDE 0.9 % IV SOLN
2.0000 g | INTRAVENOUS | Status: DC
  Administered 2023-07-20: 2 g via INTRAVENOUS
  Filled 2023-07-19: qty 20

## 2023-07-19 MED ORDER — ONDANSETRON HCL 4 MG/2ML IJ SOLN: 4.0000 mg | Freq: Four times a day (QID) | INTRAMUSCULAR | Status: DC | PRN

## 2023-07-19 NOTE — ED Notes (Signed)
 Called daughter to update on bed assignment

## 2023-07-19 NOTE — ED Notes (Signed)
 Kreg Pesa Docia Freeman  [phone]

## 2023-07-19 NOTE — ED Notes (Signed)
 Daughter updated that transport is here to pick up patient.

## 2023-07-19 NOTE — ED Notes (Signed)
 ED TO INPATIENT HANDOFF REPORT  ED Nurse Name and Phone #: Darlyne El RN   S Name/Age/Gender Natasha Hudson 58 y.o. female Room/Bed: MH04/MH04  Code Status   Code Status: Not on file  Home/SNF/Other Home Patient oriented to: self, place, time, and situation Is this baseline? Yes   Triage Complete: Triage complete  Chief Complaint Pneumonia [J18.9]  Triage Note Pt c/o worsening SHOB- was seen yesterday and dx with PNA- O2 sats 86% on RA in triage; placed on 2L O2 - sats up to 98%   Allergies No Known Allergies  Level of Care/Admitting Diagnosis ED Disposition     ED Disposition  Admit   Condition  --   Comment  Hospital Area: [facility] [[id number]]  Level of Care: Progressive [102]  Admit to Progressive based on following criteria: RESPIRATORY PROBLEMS hypoxemic/hypercapnic respiratory failure that is responsive to NIPPV (BiPAP) or High Flow Nasal Cannula (6-80 lpm). Frequent assessment/intervention, no > Q2 hrs < Q4 hrs, to maintain oxygenation and pulmonary hygiene.  Interfacility transfer: Yes  May place patient in observation at [facility] or [facility] if equivalent level of care is available:: Yes  Covid Evaluation: Asymptomatic - no recent exposure (last 10 days) testing not required  Diagnosis: Pneumonia [227785]  Admitting Physician: Marcellus Sers [[id number]]  Attending Physician: Kelsey Patricia [[id number]]          B Medical/Surgery History Past Medical History:  Diagnosis Date   Diabetes mellitus without complication (HCC)    Hypertension    History reviewed. No pertinent surgical history.   A IV Location/Drains/Wounds Patient Lines/Drains/Airways Status     Active Line/Drains/Airways     Name Placement date Placement time Site Days   Peripheral IV 07/18/23 20 G 1" Right Antecubital 07/18/23  2121  Antecubital  1            Intake/Output Last 24 hours  Intake/Output Summary (Last 24 hours) at 07/19/2023 0251 Last  data filed at 07/19/2023 0131 Gross per 24 hour  Intake --  Output 1450 ml  Net -1450 ml    Labs/Imaging Results for orders placed or performed during the hospital encounter of 07/18/23 (from the past 48 hours)  Comprehensive metabolic panel     Status: Abnormal   Collection Time: 07/18/23 10:22 PM  Result Value Ref Range   Sodium 135 135 - 145 mmol/L   Potassium 3.6 3.5 - 5.1 mmol/L    Comment: HEMOLYSIS AT THIS LEVEL MAY AFFECT RESULT   Chloride 96 (L) 98 - 111 mmol/L   CO2 25 22 - 32 mmol/L   Glucose, Bld 302 (H) 70 - 99 mg/dL    Comment: Glucose reference range applies only to samples taken after fasting for at least 8 hours.   BUN <5 (L) 6 - 20 mg/dL   Creatinine, Ser 4.54 0.44 - 1.00 mg/dL   Calcium 9.3 8.9 - 09.8 mg/dL   Total Protein 6.6 6.5 - 8.1 g/dL   Albumin 3.5 3.5 - 5.0 g/dL   AST 37 15 - 41 U/L   ALT 71 (H) 0 - 44 U/L   Alkaline Phosphatase 83 38 - 126 U/L   Total Bilirubin 0.5 0.0 - 1.2 mg/dL   GFR, Estimated >11 >91 mL/min    Comment: (NOTE) Calculated using the CKD-EPI Creatinine Equation (2021)    Anion gap 15 5 - 15    Comment: Performed at [facility], [address] [place], [place] [address]  Troponin T, High  Sensitivity     Status: None   Collection Time: 07/18/23 10:22 PM  Result Value Ref Range   Troponin T High Sensitivity 15 <19 ng/L    Comment: (NOTE) Biotin concentrations > 1000 ng/mL falsely decrease TnT results.  Serial cardiac troponin measurements are suggested.  Refer to the Links section for chest pain algorithms and additional  guidance. Performed at [facility], [address] [place], [place] [address]   Pro Brain natriuretic peptide     Status: Abnormal   Collection Time: 07/18/23 10:22 PM  Result Value Ref Range   Pro Brain Natriuretic Peptide 2,515.0 (H) <300.0 pg/mL    Comment: (NOTE) Age Group        Cut-Points    Interpretation  < 50 years     450 pg/mL       NT-proBNP > 450 pg/mL indicates                                 ADHF is likely              50 to 75 years  900 pg/mL      NT-proBNP > 900 pg/mL indicates          ADHF is likely  > 75 years      1800 pg/mL     NT-proBNP > 1800 pg/mL indicates          ADHF is likely                           All ages    Results between       Indeterminate. Further clinical             300 and the cut-   information is needed to determine            point for age group   if ADHF is present.                                                             Elecsys proBNP II/ Elecsys proBNP II STAT           Cut-Point                       Interpretation  300 pg/mL                    NT-proBNP <300pg/mL indicates                             ADHF is not likely  Performed at [facility], [address] [place], [place] [address]   Lactic acid, plasma     Status: None   Collection Time: 07/18/23 10:22 PM  Result Value Ref Range   Lactic Acid, Venous 1.6 0.5 - 1.9 mmol/L    Comment: Performed at [facility], [address] [place], [place] [address]  CBC with Differential/Platelet     Status: Abnormal   Collection Time: 07/18/23 10:31 PM  Result Value Ref Range   WBC 11.9 (H) 4.0 - 10.5  K/uL   RBC 4.75 3.87 - 5.11 MIL/uL   Hemoglobin 12.9 12.0 - 15.0 g/dL   HCT 56.2 13.0 - 86.5 %   MCV 84.2 80.0 - 100.0 fL   MCH 27.2 26.0 - 34.0 pg   MCHC 32.3 30.0 - 36.0 g/dL   RDW 78.4 69.6 - 29.5 %   Platelets 406 (H) 150 - 400 K/uL   nRBC 0.0 0.0 - 0.2 %   Neutrophils Relative % 83 %   Neutro Abs 9.9 (H) 1.7 - 7.7 K/uL   Lymphocytes Relative 8 %   Lymphs Abs 1.0 0.7 - 4.0 K/uL   Monocytes Relative 8 %   Monocytes Absolute 0.9 0.1 - 1.0 K/uL   Eosinophils Relative 0 %   Eosinophils Absolute 0.0 0.0 - 0.5 K/uL   Basophils Relative 0 %   Basophils Absolute 0.0 0.0 - 0.1 K/uL   Immature Granulocytes 1 %   Abs Immature Granulocytes 0.06 0.00 - 0.07 K/uL    Comment: Performed at [facility], [address] [place], [place] [address]  I-Stat venous blood gas, ED     Status: Abnormal   Collection Time: 07/18/23 10:37 PM  Result Value Ref Range   pH, Ven 7.445 (H) 7.25 - 7.43   pCO2, Ven 45.5 44 - 60 mmHg   pO2, Ven 35 32 - 45 mmHg   Bicarbonate 31.0 (H) 20.0 - 28.0 mmol/L   TCO2 32 22 - 32 mmol/L   O2 Saturation 66 %   Acid-Base Excess 6.0 (H) 0.0 - 2.0 mmol/L   Sodium 134 (L) 135 - 145 mmol/L   Potassium 3.5 3.5 - 5.1 mmol/L   Calcium, Ion 1.19 1.15 - 1.40 mmol/L   HCT 40.0 36.0 - 46.0 %   Hemoglobin 13.6 12.0 - 15.0 g/dL   Patient temperature 244.0 F    Collection site IV start    Drawn by Nurse    Sample type VENOUS    Comment NOTIFIED PHYSICIAN    CT Angio Chest PE W and/or Wo Contrast Result Date: 07/18/2023 CLINICAL DATA:  Suspected pulmonary embolism. EXAM: CT ANGIOGRAPHY CHEST WITH CONTRAST TECHNIQUE: Multidetector CT imaging of the chest was performed using the standard protocol during bolus administration of intravenous contrast. Multiplanar CT image reconstructions and MIPs were obtained to evaluate the vascular anatomy. RADIATION DOSE REDUCTION: This exam was performed according to the departmental dose-optimization program which includes automated exposure control, adjustment of the mA and/or kV according to patient size and/or use of iterative reconstruction technique. CONTRAST:  75mL OMNIPAQUE IOHEXOL 350 MG/ML SOLN COMPARISON:  None Available. FINDINGS: Cardiovascular: The thoracic aorta is unremarkable. Satisfactory opacification of the pulmonary arteries to the segmental level. No evidence of pulmonary embolism. Normal heart size. No pericardial effusion. Mediastinum/Nodes: No enlarged mediastinal, hilar, or axillary lymph nodes. Thyroid gland, trachea, and esophagus demonstrate no significant findings. Lungs/Pleura: Mild multifocal bilateral upper lobe infiltrates are seen. Moderate to marked severity bibasilar atelectasis and/or infiltrate is also noted. There are large  bilateral pleural effusions. No pneumothorax is identified. Upper Abdomen: No acute abnormality. Musculoskeletal: No chest wall abnormality. No acute or significant osseous findings. Review of the MIP images confirms the above findings. IMPRESSION: 1. No evidence of pulmonary embolism. 2. Mild multifocal bilateral upper lobe infiltrates. 3. Moderate to marked severity bibasilar atelectasis and/or infiltrate. 4. Large bilateral pleural effusions. Electronically Signed   By: Virgle Grime M.D.   On: 07/18/2023 23:49   DG Chest Port 1 View Result Date:  07/18/2023 CLINICAL DATA:  Shortness of breath.  Question pneumonia. EXAM: PORTABLE CHEST 1 VIEW COMPARISON:  Radiograph yesterday FINDINGS: Worsening bibasilar opacities with increasing pleural effusions and bibasilar airspace disease. Diffuse bronchial and interstitial thickening. Stable heart size and mediastinal contours. No pneumothorax. IMPRESSION: 1. Worsening bibasilar opacities with increasing pleural effusions and bibasilar airspace disease. This may represent compressive atelectasis or pneumonia. 2. Diffuse bronchial and interstitial thickening, favor pulmonary edema. Electronically Signed   By: Chadwick Colonel M.D.   On: 07/18/2023 23:07   DG Chest 2 View Result Date: 07/17/2023 CLINICAL DATA:  Shortness of breath.  Nonproductive cough. EXAM: CHEST - 2 VIEW COMPARISON:  None Available. FINDINGS: Generalized interstitial opacity with fissure thickening. Heart size is normal. Unremarkable mediastinal contours. No significant pleural fluid. No pneumothorax. IMPRESSION: Interstitial opacity usually from edema but atypical infection could also give a similar appearance. Electronically Signed   By: Ronnette Coke M.D.   On: 07/17/2023 09:40    Pending Labs Unresulted Labs (From admission, onward)     Start     Ordered   07/18/23 2231  CBC with Differential/Platelet  Once,   STAT        07/18/23 2231   07/18/23 2209  CBC with Differential  Once,    STAT        07/18/23 2209   07/18/23 2209  Culture, blood (routine x 2)  BLOOD CULTURE X 2,   STAT      07/18/23 2209            Vitals/Pain Today's Vitals   07/19/23 0113 07/19/23 0129 07/19/23 0151 07/19/23 0230  BP:   131/80 133/88  Pulse: 93  (!) 102   Resp: (!) 21  (!) 21 (!) 23  Temp:  99.4 F (37.4 C) 98.4 F (36.9 C)   TempSrc:  Oral Oral   SpO2: 91%  97%   Weight:      Height:      PainSc:        Isolation Precautions No active isolations  Medications Medications  acetaminophen (TYLENOL) tablet 1,000 mg (1,000 mg Oral Given 07/18/23 2218)  furosemide (LASIX) injection 40 mg (40 mg Intravenous Given 07/18/23 2258)  iohexol (OMNIPAQUE) 350 MG/ML injection 75 mL (75 mLs Intravenous Contrast Given 07/18/23 2318)  cefTRIAXone (ROCEPHIN) 1 g in sodium chloride 0.9 % 100 mL IVPB (0 g Intravenous Stopped 07/19/23 0157)  doxycycline (VIBRA-TABS) tablet 100 mg (100 mg Oral Given 07/19/23 0122)    Mobility walks     Focused Assessments Cardiac Assessment Handoff:  Cardiac Rhythm: Normal sinus rhythm No results found for: "CKTOTAL", "CKMB", "CKMBINDEX", "TROPONINI" No results found for: "DDIMER" Does the Patient currently have chest pain? No    R Recommendations: See Admitting Provider Note  Report given to:   Additional Notes:

## 2023-07-19 NOTE — ED Notes (Signed)
 [facility] called for transport.

## 2023-07-19 NOTE — H&P (Signed)
 History and Physical    Natasha Hudson ZOX:[id number] DOB: May 03, 1965 DOA: 07/18/2023  PCP: Pcp, No   Patient coming from: Home   Chief Complaint:  Chief Complaint  Patient presents with   Shortness of Breath   ED TRIAGE note:  HPI:  Natasha Hudson is a 58 y.o. female with medical history significant of diabetes type 2, essential hypertension presented emergency department complaining of shortness of breath.  Patient reported she noticed shortness of breath with associated mucoid cough and trace bilateral lower extremity swelling.  Patient denies any chest pain and palpitation.  Complaining about dyspnea, orthopnea and PND.  Denies any fever and chill. No other complaint at this time.  At presentation to ED patient O2 sat 86% on room air. At presentation to ED patient is tachycardic, low-grade temperature, tachypneic and hypertensive.  O2 sat 98% on 2 L oxygen. CBC showing leukocytosis 11.9 and elevated platelet count 406. Normal lactic acid level 1.6. Elevated proBNP 2515. VBG showing pH 7.4, pCO2 45, pO2 35 bicarb 31.  Chest x-ray showing bilateral pleural effusion, basilar infiltrate compatible with atelectasis pneumonia.  Also patient has diffuse interstitial thickening bilateral pulmonary edema.  CTA chest no evidence of PE.  It showed multilobular bilateral upper lobe infiltrate atelectasis and large bilateral pleural effusion.  In the ED patient has been treated with ceftriaxone, doxycycline, Lasix 40 mg and Tylenol 1000 mg.  In the ED there is no blood culture and no sputum culture has been obtained.  Patient has been transferred to [facility] for management of multifocal pneumonia, acute hypoxic respiratory failure, bilateral pleural effusion and new onset of CHF exacerbation.    Significant labs in the ED: Lab Orders         Culture, blood (routine x 2)         Culture, blood (routine x 2) Call MD if unable to obtain prior to antibiotics being given          Expectorated Sputum Assessment w Gram Stain, Rflx to Resp Cult         Respiratory (~20 pathogens) panel by PCR         Comprehensive metabolic panel         CBC with Differential         Pro Brain natriuretic peptide         CBC with Differential/Platelet         CBC with Differential/Platelet         HIV Antibody (routine testing w rflx)         Legionella Pneumophila Serogp 1 Ur Ag         Strep pneumoniae urinary antigen         Procalcitonin         CBC         Comprehensive metabolic panel         CBC         Hemoglobin A1c         I-Stat venous blood gas, ED       Review of Systems:  Review of Systems  Constitutional:  Negative for chills, fever, malaise/fatigue and weight loss.  Respiratory:  Positive for cough, sputum production and shortness of breath. Negative for wheezing.   Cardiovascular:  Positive for orthopnea and leg swelling. Negative for chest pain and palpitations.  Gastrointestinal:  Negative for abdominal pain, diarrhea, nausea and vomiting.  Genitourinary:  Negative for dysuria and urgency.  Musculoskeletal:  Negative for back pain, falls,  joint pain and myalgias.  Neurological:  Negative for dizziness and headaches.  Psychiatric/Behavioral:  The patient is not nervous/anxious.     Past Medical History:  Diagnosis Date   Diabetes mellitus without complication (HCC)    Hypertension     History reviewed. No pertinent surgical history.   reports that she has never smoked. She has never used smokeless tobacco. She reports that she does not drink alcohol and does not use drugs.  No Known Allergies  Family History  Problem Relation Age of Onset   Diabetes Other    Hypertension Other     Prior to Admission medications   Medication Sig Start Date End Date Taking? Authorizing Provider  azithromycin (ZITHROMAX) 250 MG tablet Take 1 tablet (250 mg total) by mouth daily. Take first 2 tablets together, then 1 every day until finished. 07/17/23    Mozell Arias, MD  glyBURIDE (DIABETA) 5 MG tablet Take 5 mg by mouth 2 (two) times daily with a meal.    [provider]  hydrochlorothiazide (HYDRODIURIL) 25 MG tablet Take 25 mg by mouth daily.    [provider]  ibuprofen  (ADVIL ) 800 MG tablet Take 1 tablet (800 mg total) by mouth 3 (three) times daily. 08/13/20   Mesner, Reymundo Caulk, MD  lidocaine  (LMX) 4 % cream Apply topically 4 (four) times daily as needed. For pain 05/02/12   Bonk, John-Adam, MD  metFORMIN (GLUCOPHAGE) 500 MG tablet Take 1,000 mg by mouth 2 (two) times daily with a meal.    [provider]  metroNIDAZOLE  (FLAGYL ) 500 MG tablet Take 1 tablet (500 mg total) by mouth 2 (two) times daily. 05/02/12   Cathryne Cobble, MD     Physical Exam: Vitals:   07/19/23 0129 07/19/23 0151 07/19/23 0230 07/19/23 0453  BP:  131/80 133/88 (!) 150/84  Pulse:  (!) 102  (!) 115  Resp:  (!) 21 (!) 23 (!) 21  Temp: 99.4 F (37.4 C) 98.4 F (36.9 C)  98.9 F (37.2 C)  TempSrc: Oral Oral  Oral  SpO2:  97%  92%  Weight:      Height:        Physical Exam Vitals and nursing note reviewed.  Constitutional:      General: She is not in acute distress.    Appearance: She is not ill-appearing or toxic-appearing.  Eyes:     Pupils: Pupils are equal, round, and reactive to light.  Cardiovascular:     Rate and Rhythm: Normal rate and regular rhythm.  Pulmonary:     Effort: Pulmonary effort is normal.     Breath sounds: Rhonchi present. No decreased breath sounds, wheezing or rales.  Musculoskeletal:     Right lower leg: No edema.     Left lower leg: No edema.  Skin:    General: Skin is warm.     Capillary Refill: Capillary refill takes less than 2 seconds.  Neurological:     Mental Status: She is alert and oriented to person, place, and time.  Psychiatric:        Mood and Affect: Mood normal. Mood is not anxious.      Labs on Admission: I have personally reviewed following labs and imaging  studies  CBC: Recent Labs  Lab 07/17/23 0829 07/18/23 2231 07/18/23 2237  WBC 7.5 11.9*  --   NEUTROABS  --  9.9*  --   HGB 12.8 12.9 13.6  HCT 39.0 40.0 40.0  MCV 84.1 84.2  --   PLT  424* 406*  --    Basic Metabolic Panel: Recent Labs  Lab 07/17/23 0829 07/18/23 2222 07/18/23 2237  NA 135 135 134*  K 3.9 3.6 3.5  CL 99 96*  --   CO2 25 25  --   GLUCOSE 357* 302*  --   BUN 7 <5*  --   CREATININE 0.63 0.59  --   CALCIUM 9.4 9.3  --    GFR: Estimated Creatinine Clearance: 73.5 mL/min (by C-G formula based on SCr of 0.59 mg/dL). Liver Function Tests: Recent Labs  Lab 07/18/23 2222  AST 37  ALT 71*  ALKPHOS 83  BILITOT 0.5  PROT 6.6  ALBUMIN 3.5   No results for input(s): "LIPASE", "AMYLASE" in the last 168 hours. No results for input(s): "AMMONIA" in the last 168 hours. Coagulation Profile: No results for input(s): "INR", "PROTIME" in the last 168 hours. Cardiac Enzymes: No results for input(s): "CKTOTAL", "CKMB", "CKMBINDEX", "TROPONINI", "TROPONINIHS" in the last 168 hours. BNP (last 3 results) No results for input(s): "BNP" in the last 8760 hours. HbA1C: No results for input(s): "HGBA1C" in the last 72 hours. CBG: Recent Labs  Lab 07/17/23 1021  GLUCAP 263*   Lipid Profile: No results for input(s): "CHOL", "HDL", "LDLCALC", "TRIG", "CHOLHDL", "LDLDIRECT" in the last 72 hours. Thyroid Function Tests: No results for input(s): "TSH", "T4TOTAL", "FREET4", "T3FREE", "THYROIDAB" in the last 72 hours. Anemia Panel: No results for input(s): "VITAMINB12", "FOLATE", "FERRITIN", "TIBC", "IRON", "RETICCTPCT" in the last 72 hours. Urine analysis: No results found for: "COLORURINE", "APPEARANCEUR", "LABSPEC", "PHURINE", "GLUCOSEU", "HGBUR", "BILIRUBINUR", "KETONESUR", "PROTEINUR", "UROBILINOGEN", "NITRITE", "LEUKOCYTESUR"  Radiological Exams on Admission: I have personally reviewed images CT Angio Chest PE W and/or Wo Contrast Result Date: 07/18/2023 CLINICAL  DATA:  Suspected pulmonary embolism. EXAM: CT ANGIOGRAPHY CHEST WITH CONTRAST TECHNIQUE: Multidetector CT imaging of the chest was performed using the standard protocol during bolus administration of intravenous contrast. Multiplanar CT image reconstructions and MIPs were obtained to evaluate the vascular anatomy. RADIATION DOSE REDUCTION: This exam was performed according to the departmental dose-optimization program which includes automated exposure control, adjustment of the mA and/or kV according to patient size and/or use of iterative reconstruction technique. CONTRAST:  75mL OMNIPAQUE IOHEXOL 350 MG/ML SOLN COMPARISON:  None Available. FINDINGS: Cardiovascular: The thoracic aorta is unremarkable. Satisfactory opacification of the pulmonary arteries to the segmental level. No evidence of pulmonary embolism. Normal heart size. No pericardial effusion. Mediastinum/Nodes: No enlarged mediastinal, hilar, or axillary lymph nodes. Thyroid gland, trachea, and esophagus demonstrate no significant findings. Lungs/Pleura: Mild multifocal bilateral upper lobe infiltrates are seen. Moderate to marked severity bibasilar atelectasis and/or infiltrate is also noted. There are large bilateral pleural effusions. No pneumothorax is identified. Upper Abdomen: No acute abnormality. Musculoskeletal: No chest wall abnormality. No acute or significant osseous findings. Review of the MIP images confirms the above findings. IMPRESSION: 1. No evidence of pulmonary embolism. 2. Mild multifocal bilateral upper lobe infiltrates. 3. Moderate to marked severity bibasilar atelectasis and/or infiltrate. 4. Large bilateral pleural effusions. Electronically Signed   By: Virgle Grime M.D.   On: 07/18/2023 23:49   DG Chest Port 1 View Result Date: 07/18/2023 CLINICAL DATA:  Shortness of breath.  Question pneumonia. EXAM: PORTABLE CHEST 1 VIEW COMPARISON:  Radiograph yesterday FINDINGS: Worsening bibasilar opacities with increasing pleural  effusions and bibasilar airspace disease. Diffuse bronchial and interstitial thickening. Stable heart size and mediastinal contours. No pneumothorax. IMPRESSION: 1. Worsening bibasilar opacities with increasing pleural effusions and bibasilar airspace disease. This may represent compressive  atelectasis or pneumonia. 2. Diffuse bronchial and interstitial thickening, favor pulmonary edema. Electronically Signed   By: Chadwick Colonel M.D.   On: 07/18/2023 23:07   DG Chest 2 View Result Date: 07/17/2023 CLINICAL DATA:  Shortness of breath.  Nonproductive cough. EXAM: CHEST - 2 VIEW COMPARISON:  None Available. FINDINGS: Generalized interstitial opacity with fissure thickening. Heart size is normal. Unremarkable mediastinal contours. No significant pleural fluid. No pneumothorax. IMPRESSION: Interstitial opacity usually from edema but atypical infection could also give a similar appearance. Electronically Signed   By: Ronnette Coke M.D.   On: 07/17/2023 09:40     EKG: My personal interpretation of EKG shows: EKG showing sinus tachycardia heart rate 103 and left ventricular hypertrophy partum    Assessment/Plan: Principal Problem:   Multifocal pneumonia Active Problems:   Acute hypoxic respiratory failure (HCC)   Non-insulin dependent type 2 diabetes mellitus (HCC)   Essential hypertension    Assessment and Plan: Multifocal pneumonia Sepsis due to pneumonia Acute hypoxic respiratory failure due to pneumonia and CHF exacerbation -Patient has been presented emergency department evaluation for shortness of breath, fever. -Presentation to ED patient is tachycardic, tachypneic, O2 sat dropped to 80% room air and borderline hypertensive. - CBC showed leukocytosis.  Normal lactic acid level.  Elevated proBNP 2515. -Chest x-ray showed infiltrate, atelectasis, pulmonary vascular congestion - CTA chest no evidence of PE, it showed bilateral infiltrate, atelectasis and large bilateral pleural  effusion. -Patient meets sepsis criteria in the setting of pneumonia and patient also has new onset of CHF as well. - In the ED patient has been treated with IV ceftriaxone and doxycycline. - Checking blood culture, respiratory culture, urine Legionella antigen, urine strep antigen, procalcitonin level and risk magery panel. -Continue IV ceftriaxone and doxycycline. - Continue supplemental oxygen and wean down as patient tolerates.  New onset of CHF with exacerbation Bilateral pleural effusion secondary to underlying CHF Essential hypertension -Patient presenting with complaining of shortness of breath.  O2 sat 80% room air which has been improved after placing nasal cannula oxygen, diuretics treatment. - Elevated proBNP 2515.  Normal troponin level.  EKG showing normal sinus rhythm sinus tachycardia and ventricular hypertrophy pattern - Chest x-ray and CT chest showing infiltrate, pneumonia and bilateral large pleural effusion. - In the ED patient has been received Lasix 40 mg IV. - As blood pressure within normal range continue Lasix 20 mg IV twice daily. - Holding hydrochlorothiazide in the setting of sepsis. -Heart failure treatment plan has been implemented. - Obtaining echocardiogram to assess baseline heart function.  Non-insulin-dependent DM type II - No previous A1c on the chart. - Starting scale insulin with mealtime coverage.  Holding metformin   DVT prophylaxis:  Lovenox Code Status:  Full Code Diet: Heart healthy carb modified diet Disposition Plan: Continue to monitor improvement of urine output and volume status with IV Lasix.  Pending blood culture and sputum culture result. Consults: None at this time. Admission status:   Inpatient, progressive unit  Severity of Illness: The appropriate patient status for this patient is INPATIENT. Inpatient status is judged to be reasonable and necessary in order to provide the required intensity of service to ensure the patient's  safety. The patient's presenting symptoms, physical exam findings, and initial radiographic and laboratory data in the context of their chronic comorbidities is felt to place them at high risk for further clinical deterioration. Furthermore, it is not anticipated that the patient will be medically stable for discharge from the hospital within 2 midnights of  admission.   * I certify that at the point of admission it is my clinical judgment that the patient will require inpatient hospital care spanning beyond 2 midnights from the point of admission due to high intensity of service, high risk for further deterioration and high frequency of surveillance required.Aaron Aas    Vonceil Upshur, MD [facility]  How to contact the [facility] Attending or Consulting provider 7A - 7P or covering provider during after hours 7P -7A, for this patient.  Check the care team in [place] and look for a) attending/consulting [facility] provider listed and b) the [facility] team listed Log into [email or web address] and use [place]'s universal password to access. If you do not have the password, please contact the hospital operator. Locate the [facility] provider you are looking for under [facility] and page to a number that you can be directly reached. If you still have difficulty reaching the provider, please page the [facility] (Director on Call) for the Hospitalists listed on amion for assistance.  07/19/2023, 5:55 AM

## 2023-07-19 NOTE — Progress Notes (Signed)
 [facility] Progress Note  Patient: Natasha Hudson     MVH:[id number]  DOA: 07/18/2023   PCP: Pcp, No       Brief hospital course: 58 year old female with diabetes mellitus and hypertension presents to the hospital for shortness of breath, productive cough and is found to be hypoxic with a pulse ox of 86% on room air, tachypneic and tachycardic. CT scan of the chest: Mild multifocal infiltrates in the upper lobe, large bilateral effusions and significant atelectasis.  Started on treatment for pneumonia and heart failure with ceftriaxone, doxycycline and furosemide.  Subjective:  Coughing a little but no sputum production. Feels much better overall. Ate all of her breakfast. Has not received any insulin for the CBG of 293. She cannot remember her last A1c. @ D ECHO done this AM.   Assessment and Plan: Principal Problem: Acute hypoxic respiratory failure (A) HFpEF - proBNP 2515 - Troponin 15 - 2D echo revealed an EF of 25 to 30%, indeterminant diastolic filling, possible small LV apical thrombus, moderate to severe mitral valve regurgitation, left pleural effusion and small pericardial effusion. - Bilateral pleural effusions on CT scan - Continue IV Lasix - Repeat chest x-ray tomorrow to reassess effusion  (B) multifocal pneumonia, sepsis - Temperature 100.4 - Procalcitonin is low and therefore may have viral pneumonia - If blood cultures are negative tomorrow, will consider discontinuing antibiotics  Active Problems:     Non-insulin dependent type 2 diabetes mellitus (HCC) - Continue long and short acting insulin       Code Status: Full Code Total time on patient care: 35 minutes DVT prophylaxis:  enoxaparin (LOVENOX) injection 40 mg Start: 07/19/23 1000 SCDs Start: 07/19/23 0510 Place TED hose Start: 07/19/23 0510     Objective:   Vitals:   07/19/23 0129 07/19/23 0151 07/19/23 0230 07/19/23 0453  BP:  131/80 133/88 (!) 150/84  Pulse:  (!) 102  (!) 115  Resp:   (!) 21 (!) 23 (!) 21  Temp: 99.4 F (37.4 C) 98.4 F (36.9 C)  98.9 F (37.2 C)  TempSrc: Oral Oral  Oral  SpO2:  97%  92%  Weight:      Height:       Filed Weights   07/18/23 2108  Weight: 70.3 kg   Exam: General exam: Appears comfortable  HEENT: oral mucosa moist Respiratory system: Poor air movement in lower third of lung with crackles Cardiovascular system: S1 & S2 heard  Gastrointestinal system: Abdomen soft, non-tender, nondistended. Normal bowel sounds   Extremities: No cyanosis, clubbing or edema Psychiatry:  Mood & affect appropriate.      CBC: Recent Labs  Lab 07/17/23 0829 07/18/23 2231 07/18/23 2237 07/19/23 0640  WBC 7.5 11.9*  --  11.8*  NEUTROABS  --  9.9*  --   --   HGB 12.8 12.9 13.6 12.6  HCT 39.0 40.0 40.0 41.3  MCV 84.1 84.2  --  89.2  PLT 424* 406*  --  355   Basic Metabolic Panel: Recent Labs  Lab 07/17/23 0829 07/18/23 2222 07/18/23 2237  NA 135 135 134*  K 3.9 3.6 3.5  CL 99 96*  --   CO2 25 25  --   GLUCOSE 357* 302*  --   BUN 7 <5*  --   CREATININE 0.63 0.59  --   CALCIUM 9.4 9.3  --      Scheduled Meds:  enoxaparin (LOVENOX) injection  40 mg Subcutaneous Q24H   furosemide  20 mg Intravenous BID  insulin aspart  0-5 Units Subcutaneous QHS   insulin aspart  0-6 Units Subcutaneous TID WC   insulin glargine-yfgn  10 Units Subcutaneous Daily   sodium chloride flush  3 mL Intravenous Q12H   sodium chloride flush  3 mL Intravenous Q12H    Imaging and lab data personally reviewed   Author: Naz Denunzio  07/19/2023 9:26 AM  To contact [facility]>   Check the care team in [facility] and look for the attending/consulting [facility] provider listed  Log into [email or web address] and use [place]'s universal password   Go to> "[facility]"  and find provider  If you still have difficulty reaching the provider, please page the [facility] (Director on Call) for the Hospitalists listed on amion

## 2023-07-19 NOTE — Progress Notes (Signed)
  Echocardiogram 2D Echocardiogram has been performed.  Natasha Hudson 07/19/2023, 8:54 AM

## 2023-07-19 NOTE — Progress Notes (Signed)
   07/19/23 1542  Assess: MEWS Score  Temp 99.9 F (37.7 C)  BP (!) 152/91  MAP (mmHg) 108  Pulse Rate (!) 118  ECG Heart Rate (!) 119  Resp (!) 29  SpO2 97 %  O2 Device Nasal Cannula  O2 Flow Rate (L/min) 2 L/min  Assess: MEWS Score  MEWS Temp 0  MEWS Systolic 0  MEWS Pulse 2  MEWS RR 2  MEWS LOC 0  MEWS Score 4  MEWS Score Color Red  Assess: if the MEWS score is Yellow or Red  Were vital signs accurate and taken at a resting state? Yes  Does the patient meet 2 or more of the SIRS criteria? Yes  MEWS guidelines implemented  Yes, red  Treat  MEWS Interventions Considered administering scheduled or prn medications/treatments as ordered  Take Vital Signs  Increase Vital Sign Frequency  Red: Q1hr x2, continue Q4hrs until patient remains green for 12hrs  Escalate  MEWS: Escalate Red: Discuss with charge nurse and notify provider. Consider notifying RRT. If remains red for 2 hours consider need for higher level of care  Notify: Charge Nurse/RN  Name of Charge Nurse/RN Notified Ananias Karma RN  Provider Notification  Provider Name/Title Dr Renie Carver  Date Provider Notified 07/19/23  Time Provider Notified 1545  Method of Notification  (secure chat)  Notification Reason Change in status  Provider response See new orders  Date of Provider Response 07/19/23  Time of Provider Response 1606  Notify: Rapid Response  Name of Rapid Response RN Notified Deborah RN  Date Rapid Response Notified 07/19/23  Time Rapid Response Notified 1615  Assess: SIRS CRITERIA  SIRS Temperature  0  SIRS Respirations  1  SIRS Pulse 1  SIRS WBC 0  SIRS Score Sum  2

## 2023-07-19 NOTE — Plan of Care (Signed)
 Plan of Care Note for accepted transfer   Patient name: Natasha Hudson MVH:[id number] DOB: 1965/04/02  Facility requesting transfer: [facility] ED Requesting Provider: Dr. Monique Ano Facility course: 58 year old female with history of diabetes, hypertension presented to the ED with shortness of breath and hypoxia.  Oxygen saturation 86% on room air, improved with 2 L [place].  Temperature 100.4 F and tachycardic to the 110s on arrival.  SBP in the 160s on arrival.  Labs notable for WBC count 11.9, lactate normal, glucose 302, troponin negative, pro BNP 2515, VBG with pH 7.44 and pCO2 45.5. CTA chest negative for PE but showing mild multifocal bilateral upper lobe infiltrates and moderate to marked severity bibasilar atelectasis and/or infiltrate.  In addition, there are large bilateral pleural effusions. Patient was given Tylenol, doxycycline, ceftriaxone, and IV Lasix 40 mg.  Plan of care: The patient is accepted for admission to Progressive unit at [facility]  [facility] will assume care on arrival to accepting facility. Until arrival, care as per EDP. However, [facility] available 24/7 for questions and assistance.  Check [email or web address] for on-call coverage.  Nursing staff, please call [facility] Admits & Consults System-Wide number under Amion on patient's arrival so appropriate admitting provider can evaluate the pt.

## 2023-07-20 ENCOUNTER — Observation Stay (HOSPITAL_COMMUNITY)

## 2023-07-20 ENCOUNTER — Other Ambulatory Visit (HOSPITAL_COMMUNITY): Payer: Self-pay

## 2023-07-20 ENCOUNTER — Telehealth (HOSPITAL_COMMUNITY): Payer: Self-pay | Admitting: Pharmacy Technician

## 2023-07-20 DIAGNOSIS — R040 Epistaxis: Secondary | ICD-10-CM | POA: Diagnosis not present

## 2023-07-20 DIAGNOSIS — E119 Type 2 diabetes mellitus without complications: Secondary | ICD-10-CM | POA: Diagnosis not present

## 2023-07-20 DIAGNOSIS — Z1152 Encounter for screening for COVID-19: Secondary | ICD-10-CM | POA: Diagnosis not present

## 2023-07-20 DIAGNOSIS — J1289 Other viral pneumonia: Secondary | ICD-10-CM | POA: Diagnosis present

## 2023-07-20 DIAGNOSIS — E1165 Type 2 diabetes mellitus with hyperglycemia: Secondary | ICD-10-CM | POA: Diagnosis present

## 2023-07-20 DIAGNOSIS — Z8249 Family history of ischemic heart disease and other diseases of the circulatory system: Secondary | ICD-10-CM | POA: Diagnosis not present

## 2023-07-20 DIAGNOSIS — I272 Pulmonary hypertension, unspecified: Secondary | ICD-10-CM | POA: Diagnosis present

## 2023-07-20 DIAGNOSIS — J189 Pneumonia, unspecified organism: Secondary | ICD-10-CM | POA: Diagnosis not present

## 2023-07-20 DIAGNOSIS — I340 Nonrheumatic mitral (valve) insufficiency: Secondary | ICD-10-CM | POA: Diagnosis present

## 2023-07-20 DIAGNOSIS — I428 Other cardiomyopathies: Secondary | ICD-10-CM | POA: Diagnosis present

## 2023-07-20 DIAGNOSIS — E785 Hyperlipidemia, unspecified: Secondary | ICD-10-CM | POA: Diagnosis not present

## 2023-07-20 DIAGNOSIS — R262 Difficulty in walking, not elsewhere classified: Secondary | ICD-10-CM | POA: Diagnosis present

## 2023-07-20 DIAGNOSIS — Z7984 Long term (current) use of oral hypoglycemic drugs: Secondary | ICD-10-CM | POA: Diagnosis not present

## 2023-07-20 DIAGNOSIS — I110 Hypertensive heart disease with heart failure: Secondary | ICD-10-CM | POA: Diagnosis present

## 2023-07-20 DIAGNOSIS — A419 Sepsis, unspecified organism: Secondary | ICD-10-CM | POA: Diagnosis present

## 2023-07-20 DIAGNOSIS — Z833 Family history of diabetes mellitus: Secondary | ICD-10-CM | POA: Diagnosis not present

## 2023-07-20 DIAGNOSIS — I509 Heart failure, unspecified: Secondary | ICD-10-CM | POA: Diagnosis not present

## 2023-07-20 DIAGNOSIS — E782 Mixed hyperlipidemia: Secondary | ICD-10-CM | POA: Diagnosis present

## 2023-07-20 DIAGNOSIS — J9601 Acute respiratory failure with hypoxia: Secondary | ICD-10-CM | POA: Diagnosis present

## 2023-07-20 DIAGNOSIS — I10 Essential (primary) hypertension: Secondary | ICD-10-CM | POA: Diagnosis not present

## 2023-07-20 DIAGNOSIS — Z79899 Other long term (current) drug therapy: Secondary | ICD-10-CM | POA: Diagnosis not present

## 2023-07-20 DIAGNOSIS — I5021 Acute systolic (congestive) heart failure: Secondary | ICD-10-CM | POA: Diagnosis present

## 2023-07-20 DIAGNOSIS — I429 Cardiomyopathy, unspecified: Secondary | ICD-10-CM | POA: Diagnosis not present

## 2023-07-20 DIAGNOSIS — I447 Left bundle-branch block, unspecified: Secondary | ICD-10-CM | POA: Diagnosis present

## 2023-07-20 DIAGNOSIS — I513 Intracardiac thrombosis, not elsewhere classified: Secondary | ICD-10-CM | POA: Diagnosis present

## 2023-07-20 DIAGNOSIS — R0602 Shortness of breath: Secondary | ICD-10-CM | POA: Diagnosis present

## 2023-07-20 DIAGNOSIS — E876 Hypokalemia: Secondary | ICD-10-CM | POA: Diagnosis present

## 2023-07-20 DIAGNOSIS — R931 Abnormal findings on diagnostic imaging of heart and coronary circulation: Secondary | ICD-10-CM | POA: Diagnosis present

## 2023-07-20 LAB — GLUCOSE, CAPILLARY
Glucose-Capillary: 185 mg/dL — ABNORMAL HIGH (ref 70–99)
Glucose-Capillary: 339 mg/dL — ABNORMAL HIGH (ref 70–99)
Glucose-Capillary: 301 mg/dL — ABNORMAL HIGH (ref 70–99)
Glucose-Capillary: 235 mg/dL — ABNORMAL HIGH (ref 70–99)

## 2023-07-20 LAB — COMPREHENSIVE METABOLIC PANEL WITH GFR
ALT: 37 U/L (ref 0–44)
AST: 16 U/L (ref 15–41)
Albumin: 2.6 g/dL — ABNORMAL LOW (ref 3.5–5.0)
Alkaline Phosphatase: 49 U/L (ref 38–126)
Anion gap: 10 (ref 5–15)
BUN: 8 mg/dL (ref 6–20)
CO2: 27 mmol/L (ref 22–32)
Calcium: 8.3 mg/dL — ABNORMAL LOW (ref 8.9–10.3)
Chloride: 98 mmol/L (ref 98–111)
Creatinine, Ser: 0.44 mg/dL (ref 0.44–1.00)
GFR, Estimated: >60 mL/min (ref >60)
Glucose, Bld: 181 mg/dL — ABNORMAL HIGH (ref 70–99)
Potassium: 2.8 mmol/L — ABNORMAL LOW (ref 3.5–5.1)
Sodium: 135 mmol/L (ref 135–145)
Total Bilirubin: 0.9 mg/dL (ref 0.0–1.2)
Total Protein: 6 g/dL — ABNORMAL LOW (ref 6.5–8.1)

## 2023-07-20 LAB — CBC
HCT: 37.5 % (ref 36.0–46.0)
Hemoglobin: 11.8 g/dL — ABNORMAL LOW (ref 12.0–15.0)
MCH: 27.3 pg (ref 26.0–34.0)
MCHC: 31.5 g/dL (ref 30.0–36.0)
MCV: 86.8 fL (ref 80.0–100.0)
Platelets: 322 10*3/uL (ref 150–400)
RBC: 4.32 MIL/uL (ref 3.87–5.11)
RDW: 13.2 % (ref 11.5–15.5)
WBC: 7.3 10*3/uL (ref 4.0–10.5)
nRBC: 0 % (ref 0.0–0.2)

## 2023-07-20 LAB — HEMOGLOBIN A1C
Hgb A1c MFr Bld: 15.4 % — ABNORMAL HIGH (ref 4.8–5.6)
Mean Plasma Glucose: 395 mg/dL

## 2023-07-20 LAB — LIPID PANEL
Cholesterol: 152 mg/dL (ref 0–200)
HDL: 41 mg/dL (ref >40)
LDL Cholesterol: 96 mg/dL (ref 0–99)
Total CHOL/HDL Ratio: 3.7 ratio
Triglycerides: 73 mg/dL (ref <150)
VLDL: 15 mg/dL (ref 0–40)

## 2023-07-20 LAB — MAGNESIUM: Magnesium: 1.7 mg/dL (ref 1.7–2.4)

## 2023-07-20 LAB — POTASSIUM: Potassium: 4.4 mmol/L (ref 3.5–5.1)

## 2023-07-20 MED ORDER — HEPARIN BOLUS VIA INFUSION
3700.0000 [IU] | Freq: Once | INTRAVENOUS | Status: AC
  Administered 2023-07-20: 3700 [IU] via INTRAVENOUS
  Filled 2023-07-20: qty 3700

## 2023-07-20 MED ORDER — INSULIN ASPART 100 UNIT/ML IJ SOLN
3.0000 [IU] | Freq: Three times a day (TID) | INTRAMUSCULAR | Status: DC
  Administered 2023-07-21 – 2023-07-25 (×11): 3 [IU] via SUBCUTANEOUS

## 2023-07-20 MED ORDER — POTASSIUM CHLORIDE CRYS ER 20 MEQ PO TBCR
40.0000 meq | EXTENDED_RELEASE_TABLET | Freq: Once | ORAL | Status: AC
  Administered 2023-07-20: 40 meq via ORAL
  Filled 2023-07-20: qty 2

## 2023-07-20 MED ORDER — LIVING WELL WITH DIABETES BOOK
Freq: Once | Status: DC
  Filled 2023-07-20: qty 1

## 2023-07-20 MED ORDER — HEPARIN (PORCINE) 25000 UT/250ML-% IV SOLN
1150.0000 [IU]/h | INTRAVENOUS | Status: DC
  Administered 2023-07-20: 1000 [IU]/h via INTRAVENOUS
  Administered 2023-07-21 – 2023-07-22 (×2): 1150 [IU]/h via INTRAVENOUS
  Filled 2023-07-20 (×3): qty 250

## 2023-07-20 MED ORDER — POTASSIUM CHLORIDE 10 MEQ/100ML IV SOLN
10.0000 meq | INTRAVENOUS | Status: AC
  Administered 2023-07-20: 10 meq via INTRAVENOUS
  Filled 2023-07-20 (×3): qty 100

## 2023-07-20 MED ORDER — EMPAGLIFLOZIN 10 MG PO TABS
10.0000 mg | ORAL_TABLET | Freq: Every day | ORAL | Status: DC
  Administered 2023-07-20 – 2023-07-25 (×6): 10 mg via ORAL
  Filled 2023-07-20 (×7): qty 1

## 2023-07-20 MED ORDER — POTASSIUM CHLORIDE 10 MEQ/100ML IV SOLN
10.0000 meq | INTRAVENOUS | Status: AC
  Administered 2023-07-20 (×5): 10 meq via INTRAVENOUS
  Filled 2023-07-20 (×3): qty 100

## 2023-07-20 MED ORDER — INSULIN GLARGINE-YFGN 100 UNIT/ML ~~LOC~~ SOLN
15.0000 [IU] | Freq: Every day | SUBCUTANEOUS | Status: DC
  Administered 2023-07-21 – 2023-07-25 (×5): 15 [IU] via SUBCUTANEOUS
  Filled 2023-07-20 (×6): qty 0.15

## 2023-07-20 NOTE — Inpatient Diabetes Management (Addendum)
 Inpatient Diabetes Program Recommendations  AACE/ADA: New Consensus Statement on Inpatient Glycemic Control (2015)  Target Ranges:  Prepandial:   less than 140 mg/dL      Peak postprandial:   less than 180 mg/dL (1-2 hours)      Critically ill patients:  140 - 180 mg/dL   Lab Results  Component Value Date   GLUCAP 185 (H) 07/20/2023    Review of Glycemic Control  Latest Reference Range & Units 07/19/23 07:45 07/19/23 11:30 07/19/23 16:45 07/19/23 21:53 07/20/23 07:38  Glucose-Capillary 70 - 99 mg/dL 160 (H) 109 (H) 323 (H) 183 (H) 185 (H)  (H): Data is abnormally high Diabetes history: Type 2 DM Outpatient Diabetes medications: none Current orders for Inpatient glycemic control: Novolog 0-6 units TID & HS, Semglee 10 units QD  Inpatient Diabetes Program Recommendations:    A1C in process.   Consider increasing Semglee to 15 units every day and adding Novolog 3 units TID (assuming patient is consuming >50% of meals).   Addendum: Spoke with patient and daughter regarding outpatient diabetes management.  Reviewed patient's current A1c of 15.4%. Explained what a A1c is and what it measures. Also reviewed goal A1c with patient, importance of good glucose control @ home, and blood sugar goals. Reviewed patho of DM, signs and symptoms of hyper vs hypo glycemia, interventions, survival skills, vascular changes, risk of infection with poor glycemia control, and other commorbidities.  Patient has a meter at home. Reviewed benefits of CGM with patient and is wanting to think about it. She does not have a smart phone device, however, daughter does and is with mother frequently.  Admits to drinking sugary beverages. Reviewed alternatives, importance of protein, plate method, and overall carb mindfulness. Will place dietitian consult.  Educated patient and spouse on insulin pen use at home. Reviewed contents of insulin flexpen starter kit. Reviewed all steps if insulin pen including attachment of  needle, 2-unit air shot, dialing up dose, giving injection, removing needle, disposal of sharps, storage of unused insulin, disposal of insulin etc. Patient able to provide successful return demonstration. Also reviewed troubleshooting with insulin pen. MD to give patient Rxs for insulin pens and insulin pen needles. Patient to find PCP and practice self injection. Follow up 5/14.    Thanks, Marjo Sievert, MSN, RNC-OB Diabetes Coordinator [phone] (8a-5p)

## 2023-07-20 NOTE — Plan of Care (Signed)
  Problem: Education: Goal: Knowledge of General Education information will improve Description: Including pain rating scale, medication(s)/side effects and non-pharmacologic comfort measures Outcome: Progressing   Problem: Clinical Measurements: Goal: Diagnostic test results will improve Outcome: Progressing Goal: Respiratory complications will improve Outcome: Progressing Goal: Cardiovascular complication will be avoided Outcome: Progressing   Problem: Activity: Goal: Risk for activity intolerance will decrease Outcome: Progressing   Problem: Nutrition: Goal: Adequate nutrition will be maintained Outcome: Progressing   Problem: Safety: Goal: Ability to remain free from injury will improve Outcome: Progressing   Problem: Activity: Goal: Capacity to carry out activities will improve Outcome: Progressing

## 2023-07-20 NOTE — Plan of Care (Signed)

## 2023-07-20 NOTE — Progress Notes (Addendum)
 [facility] Progress Note  Patient: Natasha Hudson     ZOX:[id number]  DOA: 07/18/2023   PCP: Pcp, No       Brief hospital course: 58 year old female with diabetes mellitus and hypertension presents to the hospital for shortness of breath, productive cough and is found to be hypoxic with a pulse ox of 86% on room air, tachypneic and tachycardic. CT scan of the chest: Mild multifocal infiltrates in the upper lobe, large bilateral effusions and significant atelectasis.  Started on treatment for pneumonia and heart failure with ceftriaxone, doxycycline and furosemide.  Also found to have an EF of 25 to 30%, and A1c of 15.4.  Insulin teaching and cardiology consult requested.  Subjective:  Continues to have shortness of breath with exertion but it has improved.  Assessment and Plan: Principal Problem: Acute hypoxic respiratory failure (A) HFpEF - proBNP 2515 - Troponin 15 - 2D echo revealed an EF of 25 to 30%, indeterminant diastolic filling, possible small LV apical thrombus, moderate to severe mitral valve regurgitation, left pleural effusion and small pericardial effusion. - Bilateral pleural effusions on CT scan - Continue IV Lasix - Cardiology consult requested  (B) multifocal pneumonia, sepsis - Temperature 100.4 - Respiratory panel, influenza, RSV, COVID negative - Procalcitonin is low and therefore may have viral pneumonia - Blood cultures are negative antibiotics discontinued  Active Problems:  Possible small LV thrombus - Start heparin infusion-follow-up cardiology recommendations  Hypokalemia - Replaced and improved to 4.4 - Magnesium 1.7 - Recheck tomorrow      Non-insulin dependent type 2 diabetes mellitus (HCC) - A1c 15.4 - LDL 96 - has not been checking sugars at home - takes Metformin and Glipizide at home - started on long and short acting insulin which she is agreeing to take at home - Increase long-acting insulin today as glucose in the 300s -  dietician and diabetes coordinator have seen her         Code Status: Full Code Total time on patient care: 35 minutes DVT prophylaxis:  enoxaparin (LOVENOX) injection 40 mg Start: 07/19/23 1000 SCDs Start: 07/19/23 0510 Place TED hose Start: 07/19/23 0510     Objective:   Vitals:   07/20/23 0029 07/20/23 0537 07/20/23 0953 07/20/23 1430  BP: 123/72 134/80 131/72 111/86  Pulse: 97  95 (!) 104  Resp: 17 18 18 18   Temp: 98.6 F (37 C) 99 F (37.2 C) 98.2 F (36.8 C) 98.3 F (36.8 C)  TempSrc: Oral Oral Oral Oral  SpO2: 97% 93% 97% 97%  Weight:  61.3 kg    Height:       Filed Weights   07/18/23 2108 07/20/23 0537  Weight: 70.3 kg 61.3 kg   Exam: General exam: Appears comfortable  HEENT: oral mucosa moist Respiratory system: Crackles at bases Cardiovascular system: S1 & S2 heard  Gastrointestinal system: Abdomen soft, non-tender, nondistended. Normal bowel sounds   Extremities: No cyanosis, clubbing or edema Psychiatry:  Mood & affect appropriate.      CBC: Recent Labs  Lab 07/17/23 0829 07/18/23 2231 07/18/23 2237 07/19/23 0640 07/20/23 0403  WBC 7.5 11.9*  --  11.8* 7.3  NEUTROABS  --  9.9*  --   --   --   HGB 12.8 12.9 13.6 12.6 11.8*  HCT 39.0 40.0 40.0 41.3 37.5  MCV 84.1 84.2  --  89.2 86.8  PLT 424* 406*  --  355 322   Basic Metabolic Panel: Recent Labs  Lab 07/17/23 0829 07/18/23 2222 07/18/23 2237  07/20/23 0403 07/20/23 0703 07/20/23 1606  NA 135 135 134* 135  --   --   K 3.9 3.6 3.5 2.8*  --  4.4  CL 99 96*  --  98  --   --   CO2 25 25  --  27  --   --   GLUCOSE 357* 302*  --  181*  --   --   BUN 7 <5*  --  8  --   --   CREATININE 0.63 0.59  --  0.44  --   --   CALCIUM 9.4 9.3  --  8.3*  --   --   MG  --   --   --   --  1.7  --      Scheduled Meds:  enoxaparin (LOVENOX) injection  40 mg Subcutaneous Q24H   furosemide  20 mg Intravenous BID   insulin aspart  0-5 Units Subcutaneous QHS   insulin aspart  0-6 Units Subcutaneous  TID WC   insulin glargine-yfgn  10 Units Subcutaneous Daily   living well with diabetes book   Does not apply Once   sodium chloride flush  3 mL Intravenous Q12H   sodium chloride flush  3 mL Intravenous Q12H    Imaging and lab data personally reviewed   Author: Kellyann Ordway  07/20/2023 5:31 PM  To contact [facility]>   Check the care team in [facility] and look for the attending/consulting [facility] provider listed  Log into [email or web address] and use [place]'s universal password   Go to> "[facility]"  and find provider  If you still have difficulty reaching the provider, please page the [facility] (Director on Call) for the Hospitalists listed on amion

## 2023-07-20 NOTE — Telephone Encounter (Signed)
 Patient Product/process development scientist completed.    The patient is insured through [facility]. Patient has [employer], may use a copay card, and/or apply for patient assistance if available.    Ran test claim for Lantus Pen and the current 30 day co-pay is $23.38.  Ran test claim for Dexcom G7 Sensor and the current 30 day co-pay is $95.06.  Ran test claim for [employer] 3 Plus Sensor and Product Not Covered  This test claim was processed through [employer]- copay amounts may vary at other pharmacies due to [employer], or as the patient moves through the different stages of their insurance plan.     Morgan Arab, CPHT Pharmacy Technician III Certified Patient Advocate [facility] Patient Advocate Team Direct Number: [phone]  Fax: [phone]

## 2023-07-20 NOTE — Progress Notes (Signed)
 PHARMACY - ANTICOAGULATION CONSULT NOTE  Pharmacy Consult for heparin Indication: possible LV thrombus  Allergies  Allergen Reactions   Metformin Diarrhea    Only a high dose    Patient Measurements: Height: 5\' 5"  (165.1 cm) Weight: 61.3 kg (135 lb 1.6 oz) IBW/kg (Calculated) : 57 HEPARIN DW (KG): 70.3  Vital Signs: Temp: 98.3 F (36.8 C) (05/13 1430) Temp Source: Oral (05/13 1430) BP: 111/86 (05/13 1430) Pulse Rate: 104 (05/13 1430)  Labs: Recent Labs    07/18/23 2222 07/18/23 2231 07/18/23 2231 07/18/23 2237 07/19/23 0640 07/20/23 0403  HGB  --  12.9   < > 13.6 12.6 11.8*  HCT  --  40.0   < > 40.0 41.3 37.5  PLT  --  406*  --   --  355 322  CREATININE 0.59  --   --   --   --  0.44   < > = values in this interval not displayed.    Estimated Creatinine Clearance: 67.3 mL/min (by C-G formula based on SCr of 0.44 mg/dL).   Medical History: Past Medical History:  Diagnosis Date   Diabetes mellitus without complication (HCC)    Hypertension     Medications:  Lovenox 40 mg - last given 5/13 ~ 1000  Assessment: 58 year old female presented with shortness of breath and cough, found to be hypoxic on room air. Treating for respiratory failure, multifactorial in setting of HF and pneumonia. ECHO revealed EF 25-30% and was also concerning for possible small LV apical thrombus. Pharmacy consulted for heparin.  Goal of Therapy:  Heparin level 0.3-0.7 units/ml Monitor platelets by anticoagulation protocol: Yes   Plan:  -Heparin 3700 unit bolus -Start heparin infusion at 1000 units/hr -Check 6 hour heparin level -Daily CBC   Lolita Rise, PharmD, BCPS Clinical Pharmacist 07/20/2023 6:18 PM

## 2023-07-21 ENCOUNTER — Telehealth (HOSPITAL_COMMUNITY): Payer: Self-pay | Admitting: Pharmacy Technician

## 2023-07-21 ENCOUNTER — Other Ambulatory Visit (HOSPITAL_COMMUNITY): Payer: Self-pay

## 2023-07-21 DIAGNOSIS — I5021 Acute systolic (congestive) heart failure: Secondary | ICD-10-CM | POA: Diagnosis not present

## 2023-07-21 DIAGNOSIS — I10 Essential (primary) hypertension: Secondary | ICD-10-CM | POA: Diagnosis not present

## 2023-07-21 DIAGNOSIS — I429 Cardiomyopathy, unspecified: Secondary | ICD-10-CM | POA: Diagnosis not present

## 2023-07-21 DIAGNOSIS — J189 Pneumonia, unspecified organism: Secondary | ICD-10-CM | POA: Diagnosis not present

## 2023-07-21 DIAGNOSIS — E785 Hyperlipidemia, unspecified: Secondary | ICD-10-CM

## 2023-07-21 DIAGNOSIS — E782 Mixed hyperlipidemia: Secondary | ICD-10-CM

## 2023-07-21 DIAGNOSIS — I447 Left bundle-branch block, unspecified: Secondary | ICD-10-CM

## 2023-07-21 LAB — BASIC METABOLIC PANEL WITH GFR
Anion gap: 10 (ref 5–15)
BUN: 11 mg/dL (ref 6–20)
CO2: 24 mmol/L (ref 22–32)
Calcium: 8.4 mg/dL — ABNORMAL LOW (ref 8.9–10.3)
Chloride: 105 mmol/L (ref 98–111)
Creatinine, Ser: 0.44 mg/dL (ref 0.44–1.00)
GFR, Estimated: >60 mL/min (ref >60)
Glucose, Bld: 92 mg/dL (ref 70–99)
Potassium: 3.4 mmol/L — ABNORMAL LOW (ref 3.5–5.1)
Sodium: 139 mmol/L (ref 135–145)

## 2023-07-21 LAB — HEPARIN LEVEL (UNFRACTIONATED)
Heparin Unfractionated: 0.26 [IU]/mL — ABNORMAL LOW (ref 0.30–0.70)
Heparin Unfractionated: 0.46 [IU]/mL (ref 0.30–0.70)
Heparin Unfractionated: 0.32 [IU]/mL (ref 0.30–0.70)

## 2023-07-21 LAB — CBC
HCT: 37.1 % (ref 36.0–46.0)
Hemoglobin: 11.3 g/dL — ABNORMAL LOW (ref 12.0–15.0)
MCH: 27.1 pg (ref 26.0–34.0)
MCHC: 30.5 g/dL (ref 30.0–36.0)
MCV: 89 fL (ref 80.0–100.0)
Platelets: 331 10*3/uL (ref 150–400)
RBC: 4.17 MIL/uL (ref 3.87–5.11)
RDW: 13.2 % (ref 11.5–15.5)
WBC: 6.8 10*3/uL (ref 4.0–10.5)
nRBC: 0 % (ref 0.0–0.2)

## 2023-07-21 LAB — GLUCOSE, CAPILLARY
Glucose-Capillary: 107 mg/dL — ABNORMAL HIGH (ref 70–99)
Glucose-Capillary: 152 mg/dL — ABNORMAL HIGH (ref 70–99)
Glucose-Capillary: 119 mg/dL — ABNORMAL HIGH (ref 70–99)
Glucose-Capillary: 129 mg/dL — ABNORMAL HIGH (ref 70–99)

## 2023-07-21 MED ORDER — FUROSEMIDE 10 MG/ML IJ SOLN
40.0000 mg | Freq: Every day | INTRAMUSCULAR | Status: DC
  Administered 2023-07-21 – 2023-07-23 (×3): 40 mg via INTRAVENOUS
  Filled 2023-07-21 (×3): qty 4

## 2023-07-21 MED ORDER — POTASSIUM CHLORIDE CRYS ER 20 MEQ PO TBCR
40.0000 meq | EXTENDED_RELEASE_TABLET | Freq: Once | ORAL | Status: AC
  Administered 2023-07-21: 40 meq via ORAL
  Filled 2023-07-21: qty 2

## 2023-07-21 MED ORDER — ROSUVASTATIN CALCIUM 20 MG PO TABS
20.0000 mg | ORAL_TABLET | Freq: Every day | ORAL | Status: DC
  Administered 2023-07-21 – 2023-07-25 (×5): 20 mg via ORAL
  Filled 2023-07-21 (×5): qty 1

## 2023-07-21 NOTE — Progress Notes (Signed)
 PHARMACY - ANTICOAGULATION CONSULT NOTE  Pharmacy Consult for heparin Indication: possible LV thrombus  Allergies  Allergen Reactions   Metformin Diarrhea    Only a high dose    Patient Measurements: Height: 5\' 5"  (165.1 cm) Weight: 65.1 kg (143 lb 8.3 oz) IBW/kg (Calculated) : 57 HEPARIN DW (KG): 70.3  Vital Signs: Temp: 98 F (36.7 C) (05/14 1330) Temp Source: Oral (05/14 1330) BP: 129/82 (05/14 1330) Pulse Rate: 95 (05/14 1330)  Labs: Recent Labs    07/18/23 2222 07/18/23 2231 07/19/23 0640 07/20/23 0403 07/21/23 0402 07/21/23 1238  HGB  --    < > 12.6 11.8* 11.3*  --   HCT  --    < > 41.3 37.5 37.1  --   PLT  --    < > 355 322 331  --   HEPARINUNFRC  --   --   --   --  0.26* 0.46  CREATININE 0.59  --   --  0.44 0.44  --    < > = values in this interval not displayed.    Estimated Creatinine Clearance: 67.3 mL/min (by C-G formula based on SCr of 0.44 mg/dL).   Medical History: Past Medical History:  Diagnosis Date   Diabetes mellitus without complication (HCC)    Hypertension     Medications:  Lovenox 40 mg - last given 5/13 ~ 1000  Assessment: 58 year old female presented with shortness of breath and cough, found to be hypoxic on room air. Treating for respiratory failure, multifactorial in setting of HF and pneumonia. ECHO revealed EF 25-30% and was also concerning for possible small LV apical thrombus. Pharmacy consulted for heparin.  07/21/2023: Most recent heparin level now therapeutic after increasing heparin to 1150 units/hr Hg/pltc stable No bleeding or infusion related concerns reported by RN  Goal of Therapy:  Heparin level 0.3-0.7 units/ml Monitor platelets by anticoagulation protocol: Yes   Plan:  - Continue heparin infusion at 1150 units/hr - Check confirmatory heparin level in 6 hr - Daily CBC   Simonne Boulos A, PharmD, BCPS Clinical Pharmacist 07/21/2023 3:04 PM

## 2023-07-21 NOTE — Progress Notes (Signed)
 Nutrition Note  RD consulted for nutrition education regarding diabetes.   Lab Results  Component Value Date   HGBA1C 15.4 (H) 07/19/2023    RD provided "Carbohydrate Counting for People with Diabetes" handout from the Academy of Nutrition and Dietetics. Discussed different food groups and their effects on blood sugar, emphasizing carbohydrate-containing foods. Provided list of carbohydrates and recommended serving sizes of common foods.  Discussed importance of controlled and consistent carbohydrate intake throughout the day. Provided examples of ways to balance meals/snacks and encouraged intake of high-fiber, whole grain complex carbohydrates. Teach back method used.  Expect good compliance. Sister and daughter at bedside.  Pt reports consuming 3 meals a day at home. Eats a lot of fast food and drinks soda. She is agreeable to switching to unsweetened and diet beverages. Reviewed increasing protein and fiber with meals. Discouraged skipping meals.  Body mass index is 23.88 kg/m. Pt meets criteria for normal based on current BMI.  Current diet order is Heart Healthy/CHO modified, patient is consuming approximately 95% of meals at this time. Labs and medications reviewed. No further nutrition interventions warranted at this time. If additional nutrition issues arise, please re-consult RD.  Arna Better, MS, RD, LDN Inpatient Clinical Dietitian Contact via Secure chat

## 2023-07-21 NOTE — Consult Note (Addendum)
 Cardiology Consultation   Patient ID: Natasha Hudson MRN: [id number]; DOB: 03/23/1965  Admit date: 07/18/2023 Date of Consult: 07/21/2023  PCP:  Juvenal Opoka   [facility] Providers Cardiologist:  Olinda Bertrand, DO   (NEW)   Patient Profile:   Natasha Hudson is a 58 y.o. female with a hx of DM2, hypertension, hyperlipidemia, GERD, and chronic hoarseness with chronic laryngitis who is being seen 07/21/2023 for the evaluation of new onset systolic heart failure at the request of Dr. Wendall Halls.  History of Present Illness:   Ms. Wahler presented to [facility] ED 07/17/2023 with shortness of breath and cough x 2 weeks.  Cough without sputum and shortness of breath progressive.  CXR consistent with atypical pneumonia versus potentially edema.  Her symptoms were felt to be due to viral infection. She presented to [facility] ED 07/18/2023 with worsening shortness of breath and O2 sats 86 on room air improved with 2 L O2.  CTA negative for PE but showed mild multifocal bilateral upper lobe infiltrates and large bilateral pleural effusions.  She was admitted for IV antibiotic for CAP.  Procalcitonin negative.  Initial lactic acid 1.6.  CBC with a mild leukocytosis. A1c 15.4% proBNP 2515  Echocardiogram obtained 07/19/2023 showed an LVEF 25-30% with RWMA including septal lateral dyssynchrony consistent with LBBB and septal and inferior wall severe hypokinesis.  There is some concern for small LV apical thrombus on Definity images, not seen on plain images.  Cardiac MR was recommended.  RV was normal in size with normal function, mildly elevated PASP.  Echocardiogram also demonstrated moderate to severe MR and no significant aortic stenosis.  There was a small pericardial effusion localized near the right atrium.  Cardiology was consulted for HF and LV thrombus.   She remains active at baseline and can complete greater than 4.0 METS.  She still works in a [employer] manager.  She stands on her feet for  her shift.  She does not smoke cigarettes.  She knows her diabetes has been uncontrolled.  She confirms no prior cardiac history.  She does report being in [place]  for spring break and had a viral illness.  She seemed to clear this illness but then developed shortness of breath prompting the current admission.  Following IV Lasix, she does feel much improved.  She remains volume up on exam.  No family history of heart disease or sudden cardiac death.  She does not have a personal history of syncope.   Past Medical History:  Diagnosis Date   Diabetes mellitus without complication (HCC)    Hypertension     History reviewed. No pertinent surgical history.   Home Medications:  Prior to Admission medications   Medication Sig Start Date End Date Taking? Authorizing Provider  azithromycin (ZITHROMAX) 250 MG tablet Take 1 tablet (250 mg total) by mouth daily. Take first 2 tablets together, then 1 every day until finished. 07/17/23  Yes Mozell Arias, MD    Inpatient Medications: Scheduled Meds:  empagliflozin  10 mg Oral Daily   furosemide  20 mg Intravenous BID   insulin aspart  0-5 Units Subcutaneous QHS   insulin aspart  0-6 Units Subcutaneous TID WC   insulin aspart  3 Units Subcutaneous TID WC   insulin glargine-yfgn  15 Units Subcutaneous Daily   living well with diabetes book   Does not apply Once   sodium chloride flush  3 mL Intravenous Q12H   sodium chloride flush  3 mL Intravenous Q12H   Continuous  Infusions:  heparin 1,150 Units/hr (07/21/23 0645)   PRN Meds: acetaminophen **OR** acetaminophen, albuterol, ondansetron **OR** ondansetron (ZOFRAN) IV, sodium chloride flush  Allergies:    Allergies  Allergen Reactions   Metformin Diarrhea    Only a high dose    Social History:   Social History   Socioeconomic History   Marital status: Single    Spouse name: Not on file   Number of children: Not on file   Years of education: Not on file   Highest education level:  Not on file  Occupational History   Not on file  Tobacco Use   Smoking status: Never   Smokeless tobacco: Never  Vaping Use   Vaping status: Never Used  Substance and Sexual Activity   Alcohol use: No   Drug use: No   Sexual activity: Not on file  Other Topics Concern   Not on file  Social History Narrative   Not on file   Social Drivers of Health   Financial Resource Strain: Not on file  Food Insecurity: No Food Insecurity (07/19/2023)   Hunger Vital Sign    Worried About Running Out of Food in the Last Year: Never true    Ran Out of Food in the Last Year: Never true  Transportation Needs: No Transportation Needs (07/19/2023)   PRAPARE - Administrator, Civil Service (Medical): No    Lack of Transportation (Non-Medical): No  Physical Activity: Not on file  Stress: Not on file  Social Connections: Unknown (07/19/2023)   Social Connection and Isolation Panel [NHANES]    Frequency of Communication with Friends and Family: Three times a week    Frequency of Social Gatherings with Friends and Family: Three times a week    Attends Religious Services: 1 to 4 times per year    Active Member of Clubs or Organizations: No    Attends Banker Meetings: Never    Marital Status: Patient declined  Intimate Partner Violence: Not At Risk (07/19/2023)   Humiliation, Afraid, Rape, and Kick questionnaire    Fear of Current or Ex-Partner: No    Emotionally Abused: No    Physically Abused: No    Sexually Abused: No    Family History:    Family History  Problem Relation Age of Onset   Diabetes Other    Hypertension Other      ROS:  Please see the history of present illness.   All other ROS reviewed and negative.     Physical Exam/Data:   Vitals:   07/20/23 1430 07/20/23 2129 07/21/23 0356 07/21/23 0429  BP: 111/86 114/80 132/81   Pulse: (!) 104 98 94   Resp: 18 20 18    Temp: 98.3 F (36.8 C) 98.5 F (36.9 C) 98.7 F (37.1 C)   TempSrc: Oral Oral Oral    SpO2: 97% 96% 94%   Weight:    65.1 kg  Height:        Intake/Output Summary (Last 24 hours) at 07/21/2023 1158 Last data filed at 07/21/2023 1153 Gross per 24 hour  Intake 473.51 ml  Output 600 ml  Net -126.49 ml      07/21/2023    4:29 AM 07/20/2023    5:37 AM 07/18/2023    9:08 PM  Last 3 Weights  Weight (lbs) 143 lb 8.3 oz 135 lb 1.6 oz 155 lb  Weight (kg) 65.1 kg 61.281 kg 70.308 kg     Body mass index is 23.88 kg/m.  General:  Well nourished, well developed, in no acute distress HEENT: normal Neck: no JVD Vascular: No carotid bruits; Distal pulses 2+ bilaterally Cardiac:  normal S1, S2, S3 Lungs:  severely diminished in bases  Abd: soft, nontender, no hepatomegaly  Ext: mild B LE edema Musculoskeletal:  No deformities, BUE and BLE strength normal and equal Skin: warm and dry  Neuro:  CNs 2-12 intact, no focal abnormalities noted Psych:  Normal affect   EKG:  The EKG was personally reviewed and demonstrates:  sinus tachycardia with HR 102 LBBB, anterior - septal Q waves, TWI, no old for comparison Telemetry:  Telemetry was personally reviewed and demonstrates:  sinus rhythm, HR 90s  Relevant CV Studies:  Echo 07/19/23:  1. Left ventricular ejection fraction, by estimation, is 25 to 30%. Left  ventricular ejection fraction by 2D MOD biplane is 27.2 %. The left  ventricle has severely decreased function. The left ventricle has no  regional wall motion abnormalities with  septal-lateral dyssynchrony consistent with LBBB and septal and inferior  wall severe hypokinesis. Indeterminate diastolic filling due to E-A  fusion. There is some concern for a small LV apical thrombus on Definity  images, I cannot find it on the  non-contrast enhanced images. Consider cardiac MRI to investigate further.   2. Right ventricular systolic function is normal. The right ventricular  size is normal. There is mildly elevated pulmonary artery systolic  pressure. The estimated right  ventricular systolic pressure is 37.3 mmHg.   3. The mitral valve is abnormal. Moderate to severe mitral valve  regurgitation (severe visually, moderate by PISA ERO 0.21 cm^2). No  evidence of mitral stenosis. The mean mitral valve gradient is 6.0 mmHg.  Though mean gradient is elevated, visually  valve opens well. Elevated gradient may be due to high flow with  significant mitral regurgitation.   4. The aortic valve is tricuspid. There is mild calcification of the  aortic valve. Aortic valve regurgitation is not visualized. No aortic  stenosis is present.   5. The inferior vena cava is normal in size with greater than 50%  respiratory variability, suggesting right atrial pressure of 3 mmHg.   6. There is a left pleural effusion. a small pericardial effusion is  present. The pericardial effusion is localized near the right atrium.   Laboratory Data:  High Sensitivity Troponin:  No results for input(s): "TROPONINIHS" in the last 720 hours.   Chemistry Recent Labs  Lab 07/18/23 2222 07/18/23 2237 07/20/23 0403 07/20/23 0703 07/20/23 1606 07/21/23 0402  NA 135 134* 135  --   --  139  K 3.6 3.5 2.8*  --  4.4 3.4*  CL 96*  --  98  --   --  105  CO2 25  --  27  --   --  24  GLUCOSE 302*  --  181*  --   --  92  BUN <5*  --  8  --   --  11  CREATININE 0.59  --  0.44  --   --  0.44  CALCIUM 9.3  --  8.3*  --   --  8.4*  MG  --   --   --  1.7  --   --   GFRNONAA >60  --  >60  --   --  >60  ANIONGAP 15  --  10  --   --  10    Recent Labs  Lab 07/18/23 2222 07/20/23 0403  PROT 6.6 6.0*  ALBUMIN 3.5  2.6*  AST 37 16  ALT 71* 37  ALKPHOS 83 49  BILITOT 0.5 0.9   Lipids  Recent Labs  Lab 07/20/23 0403  CHOL 152  TRIG 73  HDL 41  LDLCALC 96  CHOLHDL 3.7    Hematology Recent Labs  Lab 07/19/23 0640 07/20/23 0403 07/21/23 0402  WBC 11.8* 7.3 6.8  RBC 4.63 4.32 4.17  HGB 12.6 11.8* 11.3*  HCT 41.3 37.5 37.1  MCV 89.2 86.8 89.0  MCH 27.2 27.3 27.1  MCHC 30.5 31.5  30.5  RDW 13.4 13.2 13.2  PLT 355 322 331   Thyroid No results for input(s): "TSH", "FREET4" in the last 168 hours.  BNP Recent Labs  Lab 07/18/23 2222  PROBNP 2,515.0*    DDimer No results for input(s): "DDIMER" in the last 168 hours.   Radiology/Studies:  DG CHEST PORT 1 VIEW Result Date: 07/20/2023 CLINICAL DATA:  Pleural effusion EXAM: PORTABLE CHEST 1 VIEW COMPARISON:  Jul 18, 2023 FINDINGS: Improving bilateral pulmonary infiltrates with residual bilateral pleural effusions and basilar atelectasis right more than left. Heart and mediastinum within normal limits IMPRESSION: Improving bilateral pulmonary infiltrates with residual bilateral pleural effusions and basilar atelectasis right more than left. Electronically Signed   By: Fredrich Jefferson M.D.   On: 07/20/2023 08:57   ECHOCARDIOGRAM COMPLETE Result Date: 07/19/2023    ECHOCARDIOGRAM REPORT   Patient Name:   ADOREE LUBER Date of Exam: 07/19/2023 Medical Rec #:  [id number]      Height:       65.0 in Accession #:    [id number]     Weight:       155.0 lb Date of Birth:  1965-08-12      BSA:          1.775 m Patient Age:    58 years       BP:           150/84 mmHg Patient Gender: F              HR:           114 bpm. Exam Location:  Inpatient Procedure: 2D Echo, Cardiac Doppler, Color Doppler and Intracardiac            Opacification Agent (Both Spectral and Color Flow Doppler were            utilized during procedure). Indications:    I50.40* Unspecified combined systolic (congestive) and diastolic                 (congestive) heart failure  History:        Patient has no prior history of Echocardiogram examinations.                 Signs/Symptoms:Shortness of Breath and Dyspnea; Risk                 Factors:Hypertension and Diabetes. Pneumonia.  Sonographer:    Raynelle Callow RDCS Referring Phys: [id number] SUBRINA SUNDIL IMPRESSIONS  1. Left ventricular ejection fraction, by estimation, is 25 to 30%. Left ventricular ejection fraction by 2D MOD  biplane is 27.2 %. The left ventricle has severely decreased function. The left ventricle has no regional wall motion abnormalities with septal-lateral dyssynchrony consistent with LBBB and septal and inferior wall severe hypokinesis. Indeterminate diastolic filling due to E-A fusion. There is some concern for a small LV apical thrombus on Definity images, I cannot find it on the non-contrast enhanced images. Consider cardiac MRI to investigate further.  2. Right ventricular systolic function is normal. The right ventricular size is normal. There is mildly elevated pulmonary artery systolic pressure. The estimated right ventricular systolic pressure is 37.3 mmHg.  3. The mitral valve is abnormal. Moderate to severe mitral valve regurgitation (severe visually, moderate by PISA ERO 0.21 cm^2). No evidence of mitral stenosis. The mean mitral valve gradient is 6.0 mmHg. Though mean gradient is elevated, visually valve opens well. Elevated gradient may be due to high flow with significant mitral regurgitation.  4. The aortic valve is tricuspid. There is mild calcification of the aortic valve. Aortic valve regurgitation is not visualized. No aortic stenosis is present.  5. The inferior vena cava is normal in size with greater than 50% respiratory variability, suggesting right atrial pressure of 3 mmHg.  6. There is a left pleural effusion. a small pericardial effusion is present. The pericardial effusion is localized near the right atrium. FINDINGS  Left Ventricle: Left ventricular ejection fraction, by estimation, is 25 to 30%. Left ventricular ejection fraction by 2D MOD biplane is 27.2 %. The left ventricle has severely decreased function. The left ventricle has no regional wall motion abnormalities. Definity contrast agent was given IV to delineate the left ventricular endocardial borders. The left ventricular internal cavity size was normal in size. There is no left ventricular hypertrophy. Indeterminate diastolic  filling due to E-A fusion. Right Ventricle: The right ventricular size is normal. No increase in right ventricular wall thickness. Right ventricular systolic function is normal. There is mildly elevated pulmonary artery systolic pressure. The tricuspid regurgitant velocity is 2.93  m/s, and with an assumed right atrial pressure of 3 mmHg, the estimated right ventricular systolic pressure is 37.3 mmHg. Left Atrium: Left atrial size was normal in size. Right Atrium: Right atrial size was normal in size. Pericardium: There is a left pleural effusion. A small pericardial effusion is present. The pericardial effusion is localized near the right atrium. Mitral Valve: The mitral valve is abnormal. Moderate to severe mitral valve regurgitation. No evidence of mitral valve stenosis. MV peak gradient, 15.2 mmHg. The mean mitral valve gradient is 6.0 mmHg. Tricuspid Valve: The tricuspid valve is normal in structure. Tricuspid valve regurgitation is trivial. Aortic Valve: The aortic valve is tricuspid. There is mild calcification of the aortic valve. Aortic valve regurgitation is not visualized. No aortic stenosis is present. Pulmonic Valve: The pulmonic valve was normal in structure. Pulmonic valve regurgitation is trivial. Aorta: The aortic root is normal in size and structure. Venous: The inferior vena cava is normal in size with greater than 50% respiratory variability, suggesting right atrial pressure of 3 mmHg. IAS/Shunts: No atrial level shunt detected by color flow Doppler.  LEFT VENTRICLE PLAX 2D                        Biplane EF (MOD) LVIDd:         4.60 cm         LV Biplane EF:   Left LVIDs:         4.10 cm                          ventricular LV PW:         1.10 cm                          ejection LV IVS:        1.30  cm                          fraction by LVOT diam:     1.90 cm                          2D MOD LV SV:         47                               biplane is LV SV Index:   27                                27.2 %. LVOT Area:     2.84 cm  LV Volumes (MOD) LV vol d, MOD    147.0 ml A2C: LV vol d, MOD    121.0 ml A4C: LV vol s, MOD    110.0 ml A2C: LV vol s, MOD    87.0 ml A4C: LV SV MOD A2C:   37.0 ml LV SV MOD A4C:   121.0 ml LV SV MOD BP:    36.8 ml RIGHT VENTRICLE             IVC RV S prime:     18.60 cm/s  IVC diam: 1.40 cm TAPSE (M-mode): 2.1 cm LEFT ATRIUM             Index        RIGHT ATRIUM           Index LA diam:        3.90 cm 2.20 cm/m   RA Area:     12.30 cm LA Vol (A2C):   50.6 ml 28.51 ml/m  RA Volume:   28.50 ml  16.06 ml/m LA Vol (A4C):   42.2 ml 23.77 ml/m LA Biplane Vol: 45.6 ml 25.69 ml/m  AORTIC VALVE LVOT Vmax:   120.00 cm/s LVOT Vmean:  77.450 cm/s LVOT VTI:    0.166 m  AORTA Ao Root diam: 2.80 cm Ao Asc diam:  3.20 cm MITRAL VALVE                  TRICUSPID VALVE MV Area (PHT): 7.70 cm       TR Peak grad:   34.3 mmHg MV Area VTI:   2.16 cm       TR Vmax:        293.00 cm/s MV Peak grad:  15.2 mmHg MV Mean grad:  6.0 mmHg       SHUNTS MV Vmax:       1.95 m/s       Systemic VTI:  0.17 m MV Vmean:      108.0 cm/s     Systemic Diam: 1.90 cm MV Decel Time: 99 msec MR Peak grad:    131.3 mmHg MR Mean grad:    78.0 mmHg MR Vmax:         573.00 cm/s MR Vmean:        414.0 cm/s MR PISA:         3.08 cm MR PISA Eff ROA: 21 mm MR PISA Radius:  0.70 cm MV E velocity: 146.00 cm/s MV A velocity: 170.50 cm/s MV E/A ratio:  0.86 Dalton McleanMD Electronically signed by Archer Bear Signature Date/Time: 07/19/2023/2:03:58 PM    Final  CT Angio Chest PE W and/or Wo Contrast Result Date: 07/18/2023 CLINICAL DATA:  Suspected pulmonary embolism. EXAM: CT ANGIOGRAPHY CHEST WITH CONTRAST TECHNIQUE: Multidetector CT imaging of the chest was performed using the standard protocol during bolus administration of intravenous contrast. Multiplanar CT image reconstructions and MIPs were obtained to evaluate the vascular anatomy. RADIATION DOSE REDUCTION: This exam was performed according to the departmental  dose-optimization program which includes automated exposure control, adjustment of the mA and/or kV according to patient size and/or use of iterative reconstruction technique. CONTRAST:  75mL OMNIPAQUE IOHEXOL 350 MG/ML SOLN COMPARISON:  None Available. FINDINGS: Cardiovascular: The thoracic aorta is unremarkable. Satisfactory opacification of the pulmonary arteries to the segmental level. No evidence of pulmonary embolism. Normal heart size. No pericardial effusion. Mediastinum/Nodes: No enlarged mediastinal, hilar, or axillary lymph nodes. Thyroid gland, trachea, and esophagus demonstrate no significant findings. Lungs/Pleura: Mild multifocal bilateral upper lobe infiltrates are seen. Moderate to marked severity bibasilar atelectasis and/or infiltrate is also noted. There are large bilateral pleural effusions. No pneumothorax is identified. Upper Abdomen: No acute abnormality. Musculoskeletal: No chest wall abnormality. No acute or significant osseous findings. Review of the MIP images confirms the above findings. IMPRESSION: 1. No evidence of pulmonary embolism. 2. Mild multifocal bilateral upper lobe infiltrates. 3. Moderate to marked severity bibasilar atelectasis and/or infiltrate. 4. Large bilateral pleural effusions. Electronically Signed   By: Virgle Grime M.D.   On: 07/18/2023 23:49   DG Chest Port 1 View Result Date: 07/18/2023 CLINICAL DATA:  Shortness of breath.  Question pneumonia. EXAM: PORTABLE CHEST 1 VIEW COMPARISON:  Radiograph yesterday FINDINGS: Worsening bibasilar opacities with increasing pleural effusions and bibasilar airspace disease. Diffuse bronchial and interstitial thickening. Stable heart size and mediastinal contours. No pneumothorax. IMPRESSION: 1. Worsening bibasilar opacities with increasing pleural effusions and bibasilar airspace disease. This may represent compressive atelectasis or pneumonia. 2. Diffuse bronchial and interstitial thickening, favor pulmonary edema.  Electronically Signed   By: Chadwick Colonel M.D.   On: 07/18/2023 23:07     Assessment and Plan:   Acute systolic heart failure Moderate to severe MR Acute hypoxic respiratory failure - Echocardiogram with an LVEF 25-30% with mild pulmonary hypertension, Moderate to severe mitral regurgitation -Also in the setting of presumed multifocal pneumonia -Diuresing on 20 mg IV Lasix twice daily, also started on 10 mg Jardiance -PTA Toprol had been increased to 100 mg daily for better blood pressure control by PCP -Suspect I's and O's incomplete -She remains volume up on exam, would continue IV Lasix, would recommend 40 mg IV Lasix daily - GDMT might be limited by marginal blood pressure, will continue to follow - Given EKG changes, will likely need right and left heart catheterization once euvolemic a  Question LV thrombus -Concern for LV thrombus on images using Definity -Recommend evaluation with cMRI - will review with Dr. Albert Huff -She is on heparin drip -Cardiac MR may be useful in determining etiology for reduced EF   Hypertension - BP has been controlled here if not marginal   DM with hyperglycemia - A1c greater than 15% on admission -Now on insulin   Disposition - Pharmacy is working on cost comparisons for SGLT2 inhibitor, Entresto, and Eliquis -We had a long discussion regarding heart failure, possible thrombus, possible heart catheterization, cMRI, daily weights, and low-sodium diet    Risk Assessment/Risk Scores:      [place]  Heart Association (NYHA) Functional Class NYHA Class III   For questions or updates, please contact [facility] Please consult  [email or web address] for contact info under    Signed, Lamond Pilot, PA  07/21/2023 11:58 AM  ADDENDUM:   Patient seen and examined with Lamond Pilot, PA   I personally taken a history, examined the patient, reviewed relevant notes,  laboratory data / imaging studies.  I performed a substantive  portion of this encounter and formulated the important aspects of the plan.  I agree with the APP's note, impression, and recommendations; however, I have edited the note to reflect changes or salient points.   At the time of evaluation patient is accompanied by sister and daughter at bedside. Patient has been experiencing shortness of breath for the last several weeks which is getting progressively worse and she endorses orthopnea two-pillow, PND.  But no lower extremity swelling.  She is noted shortness of breath with effort related activities.  But denies anginal chest pain.  She denies near-syncope or syncopal events.  No prior history of GI or intracranial bleed.  As part of the shortness of breath workup echocardiogram was performed which reveals severely reduced LVEF with regional wall motion normalities and valvular heart disease.  Also raising the concern for possible LV thrombus.  Cardiology consulted for further evaluation and management.  PHYSICAL EXAM: Today's Vitals   07/21/23 0429 07/21/23 0800 07/21/23 1330 07/21/23 1914  BP:   129/82 (!) 128/93  Pulse:   95 (!) 102  Resp:    20  Temp:   98 F (36.7 C) 98.3 F (36.8 C)  TempSrc:   Oral Oral  SpO2:   97% 95%  Weight: 65.1 kg     Height:      PainSc:  0-No pain     Body mass index is 23.88 kg/m.   Net IO Since Admission: -736.49 mL [07/21/23 1956]  Filed Weights   07/18/23 2108 07/20/23 0537 07/21/23 0429  Weight: 70.3 kg 61.3 kg 65.1 kg    General: Age appropriate, hemodynamically stable, no acute distress HEENT: Moist membranes, trachea midline, JVP Lungs: Clear to auscultation with rales at the bases, no rhonchi's or wheezing Heart: Tachycardic, positive S1-S2, holosystolic murmur heard at the apex no gallops or rubs Abdomen: Soft, nontender, distended, positive bowel sounds all 4 quadrants Extremities: Cool to touch, 1+ pitting edema bilaterally Neuro: Alert x 4, nonfocal Psych: Cooperative and  pleasant  EKG: (personally reviewed by me) 07/17/2023: Sinus tachycardia, 105 bpm, left bundle branch block, ST-T changes in the inferior lateral leads secondary to either LBBB but ischemia cannot be entirely ruled out  Telemetry: (personally reviewed by me) Sinus rhythm without significant ectopy   Impression:  Newly discovered heart failure with reduced EF Cardiomyopathy Questionable LV thrombus Poorly controlled diabetes mellitus type 2 Hypertension. Hyperlipidemia  Recommendations:  Newly discovered heart failure with reduced EF Cardiomyopathy LBBB Stage C, NYHA class III Extremities are mildly cool to touch.  Transaminitis are not present.  Lactic acid within normal limits. Given the echocardiographic findings I am concerned that she has ischemia given the regional wall motion abnormalities. She would benefit from left and right heart catheterization.  I have discussed with the patient, and daughter during today's encounter.  However prior to doing so would like to get her euvolemic as possible. Change IV Lasix to 40 mg p.o. daily. Monitor strict I's and O's and daily weights. Continue Jardiance. Will uptitrate GDMT as hemodynamics and laboratory values allow. Continue telemetry Check respiratory panel given her hx.   Questionable LV thrombus: Cardiac MRI ordered will await results Currently on IV  heparin drip.  Poorly controlled diabetes: A1c greater than 15 Management per primary team  Hypertension: Currently not on antihypertensive medications for GDMT.  Would like to focus on utilizing her systolic blood pressures to diurese. Monitor BP  Hyperlipidemia: Most recent LDL 96 mg/dL. Given the fact that she is a diabetic would recommend a goal LDL <70 mg/dL.  Will start Crestor 20 mg p.o. nightly  Further recommendations to follow as the case evolves.   This note was created using a voice recognition software as a result there may be grammatical errors  inadvertently enclosed that do not reflect the nature of this encounter. Every attempt is made to correct such errors.   Lawsyn Heiler [place], [place], [facility]  [facility]  07/21/2023

## 2023-07-21 NOTE — Progress Notes (Signed)
 PROGRESS NOTE    Natasha Hudson  ZOX:[id number] DOB: Mar 31, 1965 DOA: 07/18/2023 PCP: Vicente Graham, No     Brief Narrative:  Natasha Hudson is a 58 year old female with diabetes mellitus and hypertension who presents to the hospital for shortness of breath, productive cough and is found to be hypoxic with a pulse ox of 86% on room air, tachypneic and tachycardic.  CT scan of the chest: Mild multifocal infiltrates in the upper lobe, large bilateral effusions and significant atelectasis.  Started on treatment for pneumonia and heart failure with ceftriaxone, doxycycline and furosemide.  Also found to have an EF of 25 to 30%, and A1c of 15.4.  Insulin teaching and cardiology consult requested.  New events last 24 hours / Subjective: Patient reports some dry cough, no shortness of breath or edema.  Hoping to go home soon.  Assessment & Plan:   Principal Problem:   Multifocal pneumonia Active Problems:   Acute hypoxic respiratory failure (HCC)   Non-insulin dependent type 2 diabetes mellitus (HCC)   Essential hypertension   Acute hypoxemic respiratory failure (HCC)   Systolic CHF, acute (HCC)   Multifocal pneumonia - Flu, RSV, Covid negative  - Procalcitonin low  - Likely viral, continue supportive care  Acute systolic HF - EF 81-19%  - Cardiology consulted - IV lasix, Jardiance  Question LV thrombus - Heparin gtt  DM with hyperglycemia - Ha1c 15.4  - Semglee, NovoLog, SSI   Hypokalemia - Replace   DVT prophylaxis: IV hep SCDs Start: 07/19/23 0510 Place TED hose Start: 07/19/23 0510  Code Status: Full Family Communication: None at bedside  Disposition Plan: Home Status is: Inpatient Remains inpatient appropriate because: IV heparin, IV lasix, Cardiology consult     Antimicrobials:  Anti-infectives (From admission, onward)    Start     Dose/Rate Route Frequency Ordered Stop   07/20/23 0100  cefTRIAXone (ROCEPHIN) 2 g in sodium chloride 0.9 % 100 mL IVPB  Status:   Discontinued        2 g 200 mL/hr over 30 Minutes Intravenous Every 24 hours 07/19/23 0509 07/20/23 1215   07/19/23 1300  doxycycline (VIBRAMYCIN) 100 mg in sodium chloride 0.9 % 250 mL IVPB  Status:  Discontinued        100 mg 125 mL/hr over 120 Minutes Intravenous Every 12 hours 07/19/23 0509 07/20/23 1215   07/19/23 0100  cefTRIAXone (ROCEPHIN) 1 g in sodium chloride 0.9 % 100 mL IVPB        1 g 200 mL/hr over 30 Minutes Intravenous  Once 07/19/23 0056 07/19/23 0157   07/19/23 0100  doxycycline (VIBRA-TABS) tablet 100 mg        100 mg Oral  Once 07/19/23 0056 07/19/23 0122        Objective: Vitals:   07/20/23 2129 07/21/23 0356 07/21/23 0429 07/21/23 1330  BP: 114/80 132/81  129/82  Pulse: 98 94  95  Resp: 20 18    Temp: 98.5 F (36.9 C) 98.7 F (37.1 C)  98 F (36.7 C)  TempSrc: Oral Oral  Oral  SpO2: 96% 94%  97%  Weight:   65.1 kg   Height:        Intake/Output Summary (Last 24 hours) at 07/21/2023 1429 Last data filed at 07/21/2023 1153 Gross per 24 hour  Intake 473.51 ml  Output 850 ml  Net -376.49 ml   Filed Weights   07/18/23 2108 07/20/23 0537 07/21/23 0429  Weight: 70.3 kg 61.3 kg 65.1 kg    Examination:  General exam: Appears calm and comfortable  Respiratory system: Clear to auscultation. Respiratory effort normal. No respiratory distress. No conversational dyspnea.  Cardiovascular system: S1 & S2 heard, RRR. No murmurs. No pedal edema. Gastrointestinal system: Abdomen is nondistended, soft and nontender. Normal bowel sounds heard. Central nervous system: Alert and oriented. No focal neurological deficits. Speech clear.  Extremities: Symmetric in appearance  Skin: No rashes, lesions or ulcers on exposed skin  Psychiatry: Judgement and insight appear normal. Mood & affect appropriate.   Data Reviewed: I have personally reviewed following labs and imaging studies  CBC: Recent Labs  Lab 07/17/23 0829 07/18/23 2231 07/18/23 2237 07/19/23 0640  07/20/23 0403 07/21/23 0402  WBC 7.5 11.9*  --  11.8* 7.3 6.8  NEUTROABS  --  9.9*  --   --   --   --   HGB 12.8 12.9 13.6 12.6 11.8* 11.3*  HCT 39.0 40.0 40.0 41.3 37.5 37.1  MCV 84.1 84.2  --  89.2 86.8 89.0  PLT 424* 406*  --  355 322 331   Basic Metabolic Panel: Recent Labs  Lab 07/17/23 0829 07/18/23 2222 07/18/23 2237 07/20/23 0403 07/20/23 0703 07/20/23 1606 07/21/23 0402  NA 135 135 134* 135  --   --  139  K 3.9 3.6 3.5 2.8*  --  4.4 3.4*  CL 99 96*  --  98  --   --  105  CO2 25 25  --  27  --   --  24  GLUCOSE 357* 302*  --  181*  --   --  92  BUN 7 <5*  --  8  --   --  11  CREATININE 0.63 0.59  --  0.44  --   --  0.44  CALCIUM 9.4 9.3  --  8.3*  --   --  8.4*  MG  --   --   --   --  1.7  --   --    GFR: Estimated Creatinine Clearance: 67.3 mL/min (by C-G formula based on SCr of 0.44 mg/dL). Liver Function Tests: Recent Labs  Lab 07/18/23 2222 07/20/23 0403  AST 37 16  ALT 71* 37  ALKPHOS 83 49  BILITOT 0.5 0.9  PROT 6.6 6.0*  ALBUMIN 3.5 2.6*   No results for input(s): "LIPASE", "AMYLASE" in the last 168 hours. No results for input(s): "AMMONIA" in the last 168 hours. Coagulation Profile: No results for input(s): "INR", "PROTIME" in the last 168 hours. Cardiac Enzymes: No results for input(s): "CKTOTAL", "CKMB", "CKMBINDEX", "TROPONINI" in the last 168 hours. BNP (last 3 results) Recent Labs    07/18/23 2222  PROBNP 2,515.0*   HbA1C: Recent Labs    07/19/23 0644  HGBA1C 15.4*   CBG: Recent Labs  Lab 07/20/23 0738 07/20/23 1119 07/20/23 1621 07/20/23 2120 07/21/23 0733  GLUCAP 185* 339* 301* 235* 107*   Lipid Profile: Recent Labs    07/20/23 0403  CHOL 152  HDL 41  LDLCALC 96  TRIG 73  CHOLHDL 3.7   Thyroid Function Tests: No results for input(s): "TSH", "T4TOTAL", "FREET4", "T3FREE", "THYROIDAB" in the last 72 hours. Anemia Panel: No results for input(s): "VITAMINB12", "FOLATE", "FERRITIN", "TIBC", "IRON", "RETICCTPCT" in  the last 72 hours. Sepsis Labs: Recent Labs  Lab 07/18/23 2222 07/19/23 0640  PROCALCITON  --  <0.10  LATICACIDVEN 1.6  --     Recent Results (from the past 240 hours)  Resp panel by RT-PCR (RSV, Flu A&B, Covid) Anterior Nasal Swab  Status: None   Collection Time: 07/17/23  8:30 AM   Specimen: Anterior Nasal Swab  Result Value Ref Range Status   SARS Coronavirus 2 by RT PCR NEGATIVE NEGATIVE Final    Comment: (NOTE) SARS-CoV-2 target nucleic acids are NOT DETECTED.  The SARS-CoV-2 RNA is generally detectable in upper respiratory specimens during the acute phase of infection. The lowest concentration of SARS-CoV-2 viral copies this assay can detect is 138 copies/mL. A negative result does not preclude SARS-Cov-2 infection and should not be used as the sole basis for treatment or other patient management decisions. A negative result may occur with  improper specimen collection/handling, submission of specimen other than nasopharyngeal swab, presence of viral mutation(s) within the areas targeted by this assay, and inadequate number of viral copies(<138 copies/mL). A negative result must be combined with clinical observations, patient history, and epidemiological information. The expected result is Negative.  Fact Sheet for Patients:  BloggerCourse.com  Fact Sheet for Healthcare Providers:  SeriousBroker.it  This test is no t yet approved or cleared by the [place]  FDA and  has been authorized for detection and/or diagnosis of SARS-CoV-2 by FDA under an Emergency Use Authorization (EUA). This EUA will remain  in effect (meaning this test can be used) for the duration of the COVID-19 declaration under Section 564(b)(1) of the Act, 21 U.S.C.section 360bbb-3(b)(1), unless the authorization is terminated  or revoked sooner.       Influenza A by PCR NEGATIVE NEGATIVE Final   Influenza B by PCR NEGATIVE NEGATIVE Final     Comment: (NOTE) The Xpert Xpress SARS-CoV-2/FLU/RSV plus assay is intended as an aid in the diagnosis of influenza from Nasopharyngeal swab specimens and should not be used as a sole basis for treatment. Nasal washings and aspirates are unacceptable for Xpert Xpress SARS-CoV-2/FLU/RSV testing.  Fact Sheet for Patients: BloggerCourse.com  Fact Sheet for Healthcare Providers: SeriousBroker.it  This test is not yet approved or cleared by the [place]  FDA and has been authorized for detection and/or diagnosis of SARS-CoV-2 by FDA under an Emergency Use Authorization (EUA). This EUA will remain in effect (meaning this test can be used) for the duration of the COVID-19 declaration under Section 564(b)(1) of the Act, 21 U.S.C. section 360bbb-3(b)(1), unless the authorization is terminated or revoked.     Resp Syncytial Virus by PCR NEGATIVE NEGATIVE Final    Comment: (NOTE) Fact Sheet for Patients: BloggerCourse.com  Fact Sheet for Healthcare Providers: SeriousBroker.it  This test is not yet approved or cleared by the [place]  FDA and has been authorized for detection and/or diagnosis of SARS-CoV-2 by FDA under an Emergency Use Authorization (EUA). This EUA will remain in effect (meaning this test can be used) for the duration of the COVID-19 declaration under Section 564(b)(1) of the Act, 21 U.S.C. section 360bbb-3(b)(1), unless the authorization is terminated or revoked.  Performed at [facility], [address] [place], [place] [address]   Culture, blood (routine x 2)     Status: None (Preliminary result)   Collection Time: 07/18/23 10:09 PM   Specimen: BLOOD  Result Value Ref Range Status   Specimen Description   Final    BLOOD RIGHT ANTECUBITAL Performed at [facility], [address] [place], [place] [address]    Special Requests    Final    BOTTLES DRAWN AEROBIC AND ANAEROBIC Blood Culture results may not be optimal due to an inadequate volume of blood received in culture bottles Performed at  [facility], [address] [place], [place] [address]    Culture   Final    NO GROWTH 2 DAYS Performed at [facility], [address] [place], [place] [address]    Report Status PENDING  Incomplete  Culture, blood (routine x 2) Call MD if unable to obtain prior to antibiotics being given     Status: None (Preliminary result)   Collection Time: 07/19/23  6:40 AM   Specimen: BLOOD  Result Value Ref Range Status   Specimen Description   Final    BLOOD BLOOD RIGHT ARM AEROBIC BOTTLE ONLY ANAEROBIC BOTTLE ONLY Performed at [facility], [address] [place], [place] [address]    Special Requests   Final    BOTTLES DRAWN AEROBIC AND ANAEROBIC Blood Culture results may not be optimal due to an inadequate volume of blood received in culture bottles Performed at [facility], [address] [place], [place] [address]    Culture   Final    NO GROWTH 2 DAYS Performed at [facility], [address], [place], [place] [address]    Report Status PENDING  Incomplete  Culture, blood (routine x 2) Call MD if unable to obtain prior to antibiotics being given     Status: None (Preliminary result)   Collection Time: 07/19/23  6:44 AM   Specimen: BLOOD  Result Value Ref Range Status   Specimen Description   Final    BLOOD BLOOD LEFT ARM AEROBIC BOTTLE ONLY ANAEROBIC BOTTLE ONLY Performed at [facility], [address], [place], [place] [address]    Special Requests   Final    BOTTLES DRAWN AEROBIC AND ANAEROBIC Blood Culture results may not be optimal due to an inadequate volume of blood received in culture bottles Performed at [facility], [address], [place], [place] [address]    Culture   Final    NO GROWTH 2  DAYS Performed at [facility], [address], [place], [place] [address]    Report Status PENDING  Incomplete      Radiology Studies: DG CHEST PORT 1 VIEW Result Date: 07/20/2023 CLINICAL DATA:  Pleural effusion EXAM: PORTABLE CHEST 1 VIEW COMPARISON:  Jul 18, 2023 FINDINGS: Improving bilateral pulmonary infiltrates with residual bilateral pleural effusions and basilar atelectasis right more than left. Heart and mediastinum within normal limits IMPRESSION: Improving bilateral pulmonary infiltrates with residual bilateral pleural effusions and basilar atelectasis right more than left. Electronically Signed   By: Fredrich Jefferson M.D.   On: 07/20/2023 08:57      Scheduled Meds:  empagliflozin  10 mg Oral Daily   furosemide  20 mg Intravenous BID   insulin aspart  0-5 Units Subcutaneous QHS   insulin aspart  0-6 Units Subcutaneous TID WC   insulin aspart  3 Units Subcutaneous TID WC   insulin glargine-yfgn  15 Units Subcutaneous Daily   living well with diabetes book   Does not apply Once   sodium chloride flush  3 mL Intravenous Q12H   sodium chloride flush  3 mL Intravenous Q12H   Continuous Infusions:  heparin 1,150 Units/hr (07/21/23 0645)     LOS: 1 day   Time spent: 35 minutes   Daren Eck, DO [facility] 07/21/2023, 2:29 PM   Available via Epic secure chat 7am-7pm After these hours, please refer to coverage provider listed on [email or web address]

## 2023-07-21 NOTE — Inpatient Diabetes Management (Signed)
 Inpatient Diabetes Program Recommendations  AACE/ADA: New Consensus Statement on Inpatient Glycemic Control (2015)  Target Ranges:  Prepandial:   less than 140 mg/dL      Peak postprandial:   less than 180 mg/dL (1-2 hours)      Critically ill patients:  140 - 180 mg/dL   Lab Results  Component Value Date   GLUCAP 107 (H) 07/21/2023   HGBA1C 15.4 (H) 07/19/2023    Review of Glycemic Control  Latest Reference Range & Units 07/19/23 21:53 07/20/23 07:38 07/20/23 11:19 07/20/23 16:21 07/20/23 21:20 07/21/23 07:33  Glucose-Capillary 70 - 99 mg/dL 782 (H) 956 (H) 213 (H) 301 (H) 235 (H) 107 (H)  (H): Data is abnormally high Diabetes history: Type 2 DM Outpatient Diabetes medications: none Current orders for Inpatient glycemic control: Novolog 0-6 units TID & HS, Semglee 15 units every day, Novolog 3 units TID, Jardiance 10 mg QD   Inpatient Diabetes Program Recommendations:   Spoke with patient and daughter again to further reinforce discussion from yesterday regarding diabetes.  Patient was able to provide self injection. Reviewed current glucose trends and dosages. Encouraged to use fingerstick option given patient preference at this time. Reviewed recommended frequency and when to follow up with PCP. Patient questions answered.   Thanks, Marjo Sievert, MSN, RNC-OB Diabetes Coordinator [phone] (8a-5p)

## 2023-07-21 NOTE — Progress Notes (Signed)
 Pharmacy Brief Note - Evening Anticoagulation Follow Up:  Pt is a 17 yoF currently anticoagulated with heparin drip for possible LV apical thrombus. For full history, see note by Tera Fellows, PharmD from earlier today.   Assessment:  Confirmatory heparin level = 0.32 remains therapeutic on heparin infusion of 1150 units/hr No bleeding reported  Goal: Heparin level 0.3 - 0.7  Plan: Continue heparin infusion at 1150 units/hr CBC, heparin level daily Monitor for signs of bleeding  Shireen Dory, PharmD 07/21/23 6:04 PM

## 2023-07-21 NOTE — Progress Notes (Signed)
 PHARMACY - ANTICOAGULATION CONSULT NOTE  Pharmacy Consult for heparin Indication: possible LV thrombus  Allergies  Allergen Reactions   Metformin Diarrhea    Only a high dose    Patient Measurements: Height: 5\' 5"  (165.1 cm) Weight: 65.1 kg (143 lb 8.3 oz) IBW/kg (Calculated) : 57 HEPARIN DW (KG): 70.3  Vital Signs: Temp: 98.7 F (37.1 C) (05/14 0356) Temp Source: Oral (05/14 0356) BP: 132/81 (05/14 0356) Pulse Rate: 94 (05/14 0356)  Labs: Recent Labs    07/18/23 2222 07/18/23 2231 07/19/23 0640 07/20/23 0403 07/21/23 0402  HGB  --    < > 12.6 11.8* 11.3*  HCT  --    < > 41.3 37.5 37.1  PLT  --    < > 355 322 331  HEPARINUNFRC  --   --   --   --  0.26*  CREATININE 0.59  --   --  0.44  --    < > = values in this interval not displayed.    Estimated Creatinine Clearance: 67.3 mL/min (by C-G formula based on SCr of 0.44 mg/dL).   Medical History: Past Medical History:  Diagnosis Date   Diabetes mellitus without complication (HCC)    Hypertension     Medications:  Lovenox 40 mg - last given 5/13 ~ 1000  Assessment: 58 year old female presented with shortness of breath and cough, found to be hypoxic on room air. Treating for respiratory failure, multifactorial in setting of HF and pneumonia. ECHO revealed EF 25-30% and was also concerning for possible small LV apical thrombus. Pharmacy consulted for heparin.  07/21/2023: Initial heparin level 0.26- subtherapeutic on IV heparin 1000 units/hr Hg/pltc stable No bleeding or infusion related concerns reported by RN  Goal of Therapy:  Heparin level 0.3-0.7 units/ml Monitor platelets by anticoagulation protocol: Yes   Plan:  -Increase heparin infusion to 1150 units/hr -Check heparin level 6 hour after rate increase -Daily CBC  Arie Kurtz, PharmD, BCPS Clinical Pharmacist 07/21/2023 5:34 AM

## 2023-07-21 NOTE — Plan of Care (Signed)
   Problem: Education: Goal: Knowledge of General Education information will improve Description Including pain rating scale, medication(s)/side effects and non-pharmacologic comfort measures Outcome: Progressing   Problem: Health Behavior/Discharge Planning: Goal: Ability to manage health-related needs will improve Outcome: Progressing

## 2023-07-21 NOTE — Telephone Encounter (Signed)
 Patient Product/process development scientist completed.    The patient is insured through [facility]. Patient has [employer], may use a copay card, and/or apply for patient assistance if available.    Ran test claim for Eliquis 5 mg and the current 30 day co-pay is $146.90.  Ran test claim for Entresto 24-26 mg and the current 30 day co-pay is $170.86.  Ran test claim for Jardiance 10 mg and the current 30 day co-pay is $152.51.  This test claim was processed through [facility]- copay amounts may vary at other pharmacies due to pharmacy/plan contracts, or as the patient moves through the different stages of their insurance plan.     Morgan Arab, CPHT Pharmacy Technician III Certified Patient Advocate [facility] Pharmacy Patient Advocate Team Direct Number: [phone]  Fax: [phone]

## 2023-07-22 ENCOUNTER — Inpatient Hospital Stay (HOSPITAL_COMMUNITY)
Admit: 2023-07-22 | Discharge: 2023-07-22 | Disposition: A | Discharge status: Home / Self Care | Attending: Physician Assistant

## 2023-07-22 ENCOUNTER — Other Ambulatory Visit (HOSPITAL_COMMUNITY)

## 2023-07-22 DIAGNOSIS — I5021 Acute systolic (congestive) heart failure: Secondary | ICD-10-CM | POA: Diagnosis not present

## 2023-07-22 DIAGNOSIS — I10 Essential (primary) hypertension: Secondary | ICD-10-CM | POA: Diagnosis not present

## 2023-07-22 DIAGNOSIS — I447 Left bundle-branch block, unspecified: Secondary | ICD-10-CM | POA: Diagnosis not present

## 2023-07-22 DIAGNOSIS — J189 Pneumonia, unspecified organism: Secondary | ICD-10-CM | POA: Diagnosis not present

## 2023-07-22 DIAGNOSIS — I429 Cardiomyopathy, unspecified: Secondary | ICD-10-CM | POA: Diagnosis not present

## 2023-07-22 LAB — RESPIRATORY PANEL BY PCR

## 2023-07-22 LAB — GLUCOSE, CAPILLARY
Glucose-Capillary: 89 mg/dL (ref 70–99)
Glucose-Capillary: 83 mg/dL (ref 70–99)
Glucose-Capillary: 162 mg/dL — ABNORMAL HIGH (ref 70–99)
Glucose-Capillary: 181 mg/dL — ABNORMAL HIGH (ref 70–99)

## 2023-07-22 LAB — CBC
HCT: 40.4 % (ref 36.0–46.0)
Hemoglobin: 12.6 g/dL (ref 12.0–15.0)
MCH: 27.1 pg (ref 26.0–34.0)
MCHC: 31.2 g/dL (ref 30.0–36.0)
MCV: 86.9 fL (ref 80.0–100.0)
Platelets: 369 10*3/uL (ref 150–400)
RBC: 4.65 MIL/uL (ref 3.87–5.11)
RDW: 13.2 % (ref 11.5–15.5)
WBC: 5.2 10*3/uL (ref 4.0–10.5)
nRBC: 0 % (ref 0.0–0.2)

## 2023-07-22 LAB — BASIC METABOLIC PANEL WITH GFR
Anion gap: 10 (ref 5–15)
BUN: 17 mg/dL (ref 6–20)
CO2: 26 mmol/L (ref 22–32)
Calcium: 8.7 mg/dL — ABNORMAL LOW (ref 8.9–10.3)
Chloride: 105 mmol/L (ref 98–111)
Creatinine, Ser: 0.51 mg/dL (ref 0.44–1.00)
GFR, Estimated: >60 mL/min (ref >60)
Glucose, Bld: 93 mg/dL (ref 70–99)
Potassium: 3.3 mmol/L — ABNORMAL LOW (ref 3.5–5.1)
Sodium: 141 mmol/L (ref 135–145)

## 2023-07-22 LAB — EXPECTORATED SPUTUM ASSESSMENT W GRAM STAIN, RFLX TO RESP C

## 2023-07-22 LAB — HEPARIN LEVEL (UNFRACTIONATED): Heparin Unfractionated: 0.4 [IU]/mL (ref 0.30–0.70)

## 2023-07-22 LAB — MAGNESIUM: Magnesium: 1.8 mg/dL (ref 1.7–2.4)

## 2023-07-22 MED ORDER — GADOBUTROL 1 MMOL/ML IV SOLN
9.0000 mL | Freq: Once | INTRAVENOUS | Status: AC | PRN
Start: 2023-07-22 — End: 2023-07-22
  Administered 2023-07-22: 9 mL via INTRAVENOUS

## 2023-07-22 MED ORDER — POTASSIUM CHLORIDE CRYS ER 20 MEQ PO TBCR
40.0000 meq | EXTENDED_RELEASE_TABLET | Freq: Two times a day (BID) | ORAL | Status: AC
  Administered 2023-07-22 (×2): 40 meq via ORAL
  Filled 2023-07-22 (×2): qty 2

## 2023-07-22 MED ORDER — ASPIRIN 81 MG PO CHEW
81.0000 mg | CHEWABLE_TABLET | ORAL | Status: AC
  Administered 2023-07-23: 81 mg via ORAL
  Filled 2023-07-22: qty 1

## 2023-07-22 NOTE — H&P (View-Only) (Signed)
 Patient Name: Natasha Hudson Date of Encounter: 07/22/2023  [facility] Cardiologist: Olinda Bertrand, DO (NEW)  Interval Summary  .    Is feeling well and that symptoms are improving. Has had good urine output. Denies chest pain, shortness of breath, diaphoresis, nausea, and vomiting. Is able to lye flat on the bed and not have shortness of breath.  Vital Signs .    Vitals:   07/21/23 0429 07/21/23 1330 07/21/23 1914 07/22/23 0524  BP:  129/82 (!) 128/93 117/69  Pulse:  95 (!) 102 90  Resp:   20 20  Temp:  98 F (36.7 C) 98.3 F (36.8 C) 98.3 F (36.8 C)  TempSrc:  Oral Oral Oral  SpO2:  97% 95% 96%  Weight: 65.1 kg     Height:        Intake/Output Summary (Last 24 hours) at 07/22/2023 0641 Last data filed at 07/21/2023 2227 Gross per 24 hour  Intake 840 ml  Output 1700 ml  Net -860 ml      07/21/2023    4:29 AM 07/20/2023    5:37 AM 07/18/2023    9:08 PM  Last 3 Weights  Weight (lbs) 143 lb 8.3 oz 135 lb 1.6 oz 155 lb  Weight (kg) 65.1 kg 61.281 kg 70.308 kg      Telemetry/ECG    Normal sinus rhythm in the 80's and 90's a few episodes of ventricular trigeminy - Personally Reviewed  Physical Exam .   GEN: No acute distress.   Neck: No JVD Cardiac: RRR, no murmurs, rubs, or gallops.  Respiratory: absent breath sounds on lower lobes. GI: Soft, nontender, non-distended  MS: 1+ edema  Assessment & Plan .     Acute systolic heart failure Cardiomyopathy LBBB Moderate to severe MR Acute hypoxic respiratory failure EKG showed  sinus tachycardia with HR 102 LBBB, anterior - septal Q waves, TWI, no old for comparison. Echocardiogram with an LVEF 25-30% with mild pulmonary hypertension, Moderate to severe mitral regurgitation -Also in the setting of presumed multifocal pneumonia -Initially diuresing on 20 mg IV Lasix twice daily, which was increased to 40mg  IV Lasix daily. Was also started on 10 mg Jardiance -PTA Toprol had been increased to 100 mg daily for  better blood pressure control by PCP. Is not currently on Toprol due to lower blood pressures. -Suspect I's and O's incomplete. Weight down 11 lbs since admission. -- Potassium 3.3 on 07/22/23 will increase oral potassium intake. Creatinine 0.51. - GDMT might be limited by marginal blood pressure, will continue to follow -- Volume status is improving tentatively planning for [facility] tomorrow. She was consented for this. Informed Consent   Shared Decision Making/Informed Consent{ The risks [stroke (1 in 1000), death (1 in 1000), kidney failure [usually temporary] (1 in 500), bleeding (1 in 200), allergic reaction [possibly serious] (1 in 200)], benefits (diagnostic support and management of coronary artery disease) and alternatives of a left and right cardiac catheterization were discussed in detail with Natasha Hudson and she is willing to proceed.   Question LV thrombus -Concern for LV thrombus on images using Definity - Is scheduled for a Cardiac MRI to evaluate possible LV thrombus and determine etiology for reduced EF. -continue heparin drip    Hypertension - BP has been controlled here if not marginal. BP likely reduced by diuresis   Poorly controlled diabetes - A1c greater than 15% on admission -Now on insulin Management per primary   Hyperlipidemia LDL 96 on 07/20/23. Because patient is diabetic  was started on statin with goal LDL <70. -- continue Crestor 20   Disposition - Pharmacy is working on cost comparisons for SGLT2 inhibitor, Entresto, and Eliquis   For questions or updates, please contact [facility] Please consult [email or web address] for contact info under      Signed, Melita Springer, PA-C   ADDENDUM:   Patient seen and examined with Melita Springer, PA-C .  I personally taken a history, examined the patient, reviewed relevant notes,  laboratory data / imaging studies.  I performed a substantive portion of this encounter and formulated the important aspects of the  plan.  I agree with the APP's note, impression, and recommendations; however, I have edited the note to reflect changes or salient points.   Patient seen and examined at bedside. Denies anginal chest pain. Shortness of breath significantly improved. I's and O's are not strict. Scheduled for cardiac MRI later today No family at bedside  PHYSICAL EXAM: Today's Vitals   07/21/23 2140 07/22/23 0524 07/22/23 0708 07/22/23 0900  BP:  117/69    Pulse:  90    Resp:  20    Temp:  98.3 F (36.8 C)    TempSrc:  Oral    SpO2:  96%    Weight:   61.4 kg   Height:      PainSc: 0-No pain   0-No pain   Body mass index is 22.53 kg/m.   Net IO Since Admission: -1,586.49 mL [07/22/23 1302]  Filed Weights   07/20/23 0537 07/21/23 0429 07/22/23 0708  Weight: 61.3 kg 65.1 kg 61.4 kg    General: Age appropriate, hemodynamically stable, no acute distress HEENT: Moist membranes, trachea midline, JVP(improving) Lungs: Clear to auscultation with rales at the bases, no rhonchi's or wheezing Heart: regular, positive S1-S2, holosystolic murmur heard at the apex no gallops or rubs Abdomen: Soft, nontender, distended, positive bowel sounds all 4 quadrants Extremities: warm to touch, 1+ pitting edema bilaterally Neuro: Alert x 4, nonfocal Psych: Cooperative and pleasant  EKG: (personally reviewed by me) No new tracing  Telemetry: (personally reviewed by me) Sinus rhythm   Impression:  Newly discovered heart failure with reduced EF Cardiomyopathy Questionable LV thrombus Poorly controlled diabetes mellitus type 2 Hypertension. Hyperlipidemia   Recommendations:  Newly discovered heart failure with reduced EF Cardiomyopathy LBBB Stage C, NYHA class II/III Extremities are warmer compared to yday. Transaminitis are not present.  Lactic acid within normal limits. Not in low output failure / shock physiology.  Given the echocardiographic findings I am concerned that she has ischemia given the  regional wall motion abnormalities. She would benefit from left and right heart catheterization.  I have discussed with the patient, and daughter yesterday and again today w/ patient. She is agreeable and no questions.  Change IV Lasix to 40 mg p.o. daily. Monitor strict I's and O's and daily weights. Net IO Since Admission: -1,586.49 mL [07/22/23 1307]. RN States weights are more accurate.   Continue Jardiance. Will uptitrate GDMT s/p cath to prevent addition of meds that may effect renal function. Continue to diurese for now.  Telemetry reviewed.  Respiratory panel still pending.     Questionable LV thrombus: Cardiac MRI ordered will await results Currently on IV heparin drip. If cardiac MRI confirms no LV thrombus we will discontinue IV heparin.   Poorly controlled diabetes: A1c greater than 15 Management per primary team   Hypertension: Blood pressures are well-controlled. Currently being diuresed. Will uptitrate GDMT closer to discharge status post heart catheterization.  Hyperlipidemia: Most recent LDL 96 mg/dL. Given the fact that she is a diabetic would recommend a goal LDL <70 mg/dL.  Started Crestor 20 mg p.o. nightly 07/21/2023 recommend 6-week follow-up fasting lipids and CMP to evaluate LFTs.  Further recommendations to follow as the case evolves.   This note was created using a voice recognition software as a result there may be grammatical errors inadvertently enclosed that do not reflect the nature of this encounter. Every attempt is made to correct such errors.   Natasha Hudson Natasha Hudson, [facility] [place]  [facility]  Pager: [phone] Office: [phone] 07/22/2023 1:02 PM

## 2023-07-22 NOTE — Plan of Care (Incomplete)
  Problem: Education: Goal: Knowledge of General Education information will improve Description: Including pain rating scale, medication(s)/side effects and non-pharmacologic comfort measures Outcome: Progressing   Problem: Health Behavior/Discharge Planning: Goal: Ability to manage health-related needs will improve Outcome: Progressing   Problem: Clinical Measurements: Goal: Ability to maintain clinical measurements within normal limits will improve Outcome: Progressing Goal: Diagnostic test results will improve Outcome: Progressing Goal: Cardiovascular complication will be avoided Outcome: Progressing   Problem: Nutrition: Goal: Adequate nutrition will be maintained Outcome: Progressing   Problem: Cardiac: Goal: Ability to achieve and maintain adequate cardiopulmonary perfusion will improve Outcome: Progressing   Problem: Tissue Perfusion: Goal: Adequacy of tissue perfusion will improve Outcome: Progressing   Problem: Clinical Measurements: Goal: Will remain free from infection Outcome: Adequate for Discharge Goal: Respiratory complications will improve Outcome: Adequate for Discharge   Problem: Coping: Goal: Level of anxiety will decrease Outcome: Adequate for Discharge   Problem: Elimination: Goal: Will not experience complications related to bowel motility Outcome: Adequate for Discharge Goal: Will not experience complications related to urinary retention Outcome: Adequate for Discharge   Problem: Pain Managment: Goal: General experience of comfort will improve and/or be controlled Outcome: Adequate for Discharge   Problem: Safety: Goal: Ability to remain free from injury will improve Outcome: Adequate for Discharge   Problem: Activity: Goal: Capacity to carry out activities will improve Outcome: Adequate for Discharge   Problem: Activity: Goal: Ability to tolerate increased activity will improve Outcome: Adequate for Discharge   Problem: Skin  Integrity: Goal: Risk for impaired skin integrity will decrease Outcome: Adequate for Discharge

## 2023-07-22 NOTE — Progress Notes (Signed)
 PHARMACY - ANTICOAGULATION CONSULT NOTE  Pharmacy Consult for heparin Indication: possible LV thrombus  Allergies  Allergen Reactions   Metformin Diarrhea    Only a high dose    Patient Measurements: Height: 5\' 5"  (165.1 cm) Weight: 65.1 kg (143 lb 8.3 oz) IBW/kg (Calculated) : 57 HEPARIN DW (KG): 70.3  Vital Signs: Temp: 98.3 F (36.8 C) (05/15 0524) Temp Source: Oral (05/15 0524) BP: 117/69 (05/15 0524) Pulse Rate: 90 (05/15 0524)  Labs: Recent Labs    07/20/23 0403 07/20/23 0403 07/21/23 0402 07/21/23 1238 07/21/23 1827 07/22/23 0411  HGB 11.8*  --  11.3*  --   --  12.6  HCT 37.5  --  37.1  --   --  40.4  PLT 322  --  331  --   --  369  HEPARINUNFRC  --    < > 0.26* 0.46 0.32 0.40  CREATININE 0.44  --  0.44  --   --  0.51   < > = values in this interval not displayed.    Estimated Creatinine Clearance: 67.3 mL/min (by C-G formula based on SCr of 0.51 mg/dL).   Medical History: Past Medical History:  Diagnosis Date   Diabetes mellitus without complication (HCC)    Hypertension     Medications:  5/12-5/13 Lovenox 40 mg - last administered 5/13 10:51  Assessment: 58 year old female presented with shortness of breath and cough, found to be hypoxic on room air. Treating for respiratory failure, multifactorial in setting of HF and pneumonia. ECHO revealed EF 25-30% and was also concerning for possible small LV apical thrombus. Pharmacy consulted for heparin.  07/22/2023: 04:11 Heparin level = 0.4; remains therapeutic with IV heparin infusing at 1150 units/hr Hgb/pltc WNL No bleeding or infusion related concerns reported by RN  Goal of Therapy:  Heparin level 0.3-0.7 units/ml Monitor platelets by anticoagulation protocol: Yes   Plan:  - Continue heparin infusion at 1150 units/hr - Monitor daily heparin level, CBC, signs/symptoms of bleeding    Delpha Fickle, PharmD, BCPS Clinical Pharmacist 07/22/2023 7:18 AM

## 2023-07-22 NOTE — Progress Notes (Addendum)
 Patient Name: Natasha Hudson Date of Encounter: 07/22/2023  [facility] Cardiologist: Olinda Bertrand, DO (NEW)  Interval Summary  .    Is feeling well and that symptoms are improving. Has had good urine output. Denies chest pain, shortness of breath, diaphoresis, nausea, and vomiting. Is able to lye flat on the bed and not have shortness of breath.  Vital Signs .    Vitals:   07/21/23 0429 07/21/23 1330 07/21/23 1914 07/22/23 0524  BP:  129/82 (!) 128/93 117/69  Pulse:  95 (!) 102 90  Resp:   20 20  Temp:  98 F (36.7 C) 98.3 F (36.8 C) 98.3 F (36.8 C)  TempSrc:  Oral Oral Oral  SpO2:  97% 95% 96%  Weight: 65.1 kg     Height:        Intake/Output Summary (Last 24 hours) at 07/22/2023 0641 Last data filed at 07/21/2023 2227 Gross per 24 hour  Intake 840 ml  Output 1700 ml  Net -860 ml      07/21/2023    4:29 AM 07/20/2023    5:37 AM 07/18/2023    9:08 PM  Last 3 Weights  Weight (lbs) 143 lb 8.3 oz 135 lb 1.6 oz 155 lb  Weight (kg) 65.1 kg 61.281 kg 70.308 kg      Telemetry/ECG    Normal sinus rhythm in the 80's and 90's a few episodes of ventricular trigeminy - Personally Reviewed  Physical Exam .   GEN: No acute distress.   Neck: No JVD Cardiac: RRR, no murmurs, rubs, or gallops.  Respiratory: absent breath sounds on lower lobes. GI: Soft, nontender, non-distended  MS: 1+ edema  Assessment & Plan .     Acute systolic heart failure Cardiomyopathy LBBB Moderate to severe MR Acute hypoxic respiratory failure EKG showed  sinus tachycardia with HR 102 LBBB, anterior - septal Q waves, TWI, no old for comparison. Echocardiogram with an LVEF 25-30% with mild pulmonary hypertension, Moderate to severe mitral regurgitation -Also in the setting of presumed multifocal pneumonia -Initially diuresing on 20 mg IV Lasix twice daily, which was increased to 40mg  IV Lasix daily. Was also started on 10 mg Jardiance -PTA Toprol had been increased to 100 mg daily for  better blood pressure control by PCP. Is not currently on Toprol due to lower blood pressures. -Suspect I's and O's incomplete. Weight down 11 lbs since admission. -- Potassium 3.3 on 07/22/23 will increase oral potassium intake. Creatinine 0.51. - GDMT might be limited by marginal blood pressure, will continue to follow -- Volume status is improving tentatively planning for [facility] tomorrow. She was consented for this. Informed Consent   Shared Decision Making/Informed Consent{ The risks [stroke (1 in 1000), death (1 in 1000), kidney failure [usually temporary] (1 in 500), bleeding (1 in 200), allergic reaction [possibly serious] (1 in 200)], benefits (diagnostic support and management of coronary artery disease) and alternatives of a left and right cardiac catheterization were discussed in detail with Ms. Casanas and she is willing to proceed.   Question LV thrombus -Concern for LV thrombus on images using Definity - Is scheduled for a Cardiac MRI to evaluate possible LV thrombus and determine etiology for reduced EF. -continue heparin drip    Hypertension - BP has been controlled here if not marginal. BP likely reduced by diuresis   Poorly controlled diabetes - A1c greater than 15% on admission -Now on insulin Management per primary   Hyperlipidemia LDL 96 on 07/20/23. Because patient is diabetic  was started on statin with goal LDL <70. -- continue Crestor 20   Disposition - Pharmacy is working on cost comparisons for SGLT2 inhibitor, Entresto, and Eliquis   For questions or updates, please contact [facility] Please consult [email or web address] for contact info under      Signed, Melita Springer, PA-C   ADDENDUM:   Patient seen and examined with Melita Springer, PA-C .  I personally taken a history, examined the patient, reviewed relevant notes,  laboratory data / imaging studies.  I performed a substantive portion of this encounter and formulated the important aspects of the  plan.  I agree with the APP's note, impression, and recommendations; however, I have edited the note to reflect changes or salient points.   Patient seen and examined at bedside. Denies anginal chest pain. Shortness of breath significantly improved. I's and O's are not strict. Scheduled for cardiac MRI later today No family at bedside  PHYSICAL EXAM: Today's Vitals   07/21/23 2140 07/22/23 0524 07/22/23 0708 07/22/23 0900  BP:  117/69    Pulse:  90    Resp:  20    Temp:  98.3 F (36.8 C)    TempSrc:  Oral    SpO2:  96%    Weight:   61.4 kg   Height:      PainSc: 0-No pain   0-No pain   Body mass index is 22.53 kg/m.   Net IO Since Admission: -1,586.49 mL [07/22/23 1302]  Filed Weights   07/20/23 0537 07/21/23 0429 07/22/23 0708  Weight: 61.3 kg 65.1 kg 61.4 kg    General: Age appropriate, hemodynamically stable, no acute distress HEENT: Moist membranes, trachea midline, JVP(improving) Lungs: Clear to auscultation with rales at the bases, no rhonchi's or wheezing Heart: regular, positive S1-S2, holosystolic murmur heard at the apex no gallops or rubs Abdomen: Soft, nontender, distended, positive bowel sounds all 4 quadrants Extremities: warm to touch, 1+ pitting edema bilaterally Neuro: Alert x 4, nonfocal Psych: Cooperative and pleasant  EKG: (personally reviewed by me) No new tracing  Telemetry: (personally reviewed by me) Sinus rhythm   Impression:  Newly discovered heart failure with reduced EF Cardiomyopathy Questionable LV thrombus Poorly controlled diabetes mellitus type 2 Hypertension. Hyperlipidemia   Recommendations:  Newly discovered heart failure with reduced EF Cardiomyopathy LBBB Stage C, NYHA class II/III Extremities are warmer compared to yday. Transaminitis are not present.  Lactic acid within normal limits. Not in low output failure / shock physiology.  Given the echocardiographic findings I am concerned that she has ischemia given the  regional wall motion abnormalities. She would benefit from left and right heart catheterization.  I have discussed with the patient, and daughter yesterday and again today w/ patient. She is agreeable and no questions.  Change IV Lasix to 40 mg p.o. daily. Monitor strict I's and O's and daily weights. Net IO Since Admission: -1,586.49 mL [07/22/23 1307]. RN States weights are more accurate.   Continue Jardiance. Will uptitrate GDMT s/p cath to prevent addition of meds that may effect renal function. Continue to diurese for now.  Telemetry reviewed.  Respiratory panel still pending.     Questionable LV thrombus: Cardiac MRI ordered will await results Currently on IV heparin drip. If cardiac MRI confirms no LV thrombus we will discontinue IV heparin.   Poorly controlled diabetes: A1c greater than 15 Management per primary team   Hypertension: Blood pressures are well-controlled. Currently being diuresed. Will uptitrate GDMT closer to discharge status post heart catheterization.  Hyperlipidemia: Most recent LDL 96 mg/dL. Given the fact that she is a diabetic would recommend a goal LDL <70 mg/dL.  Started Crestor 20 mg p.o. nightly 07/21/2023 recommend 6-week follow-up fasting lipids and CMP to evaluate LFTs.  Further recommendations to follow as the case evolves.   This note was created using a voice recognition software as a result there may be grammatical errors inadvertently enclosed that do not reflect the nature of this encounter. Every attempt is made to correct such errors.   Nyazia Canevari Fredrich Jefferson, [facility] [place]  [facility]  Pager: [phone] Office: [phone] 07/22/2023 1:02 PM

## 2023-07-22 NOTE — Plan of Care (Signed)

## 2023-07-22 NOTE — Progress Notes (Addendum)
 PROGRESS NOTE    Natasha Hudson  ZWC:[id number] DOB: [birth year] DOA: 07/18/2023 PCP: Vicente Graham, No     Brief Narrative:  Natasha Hudson is a 58 year old female with diabetes mellitus and hypertension who presents to the hospital for shortness of breath, productive cough and is found to be hypoxic with a pulse ox of 86% on room air, tachypneic and tachycardic.  CT scan of the chest: Mild multifocal infiltrates in the upper lobe, large bilateral effusions and significant atelectasis.  Started on treatment for pneumonia and heart failure with ceftriaxone, doxycycline and furosemide.  Also found to have an EF of 25 to 30%, and A1c of 15.4.  Insulin teaching and cardiology consult requested.  New events last 24 hours / Subjective: Continues to have cough but denies any shortness of breath or chest pressure.  No edema.  Assessment & Plan:   Principal Problem:   Multifocal pneumonia Active Problems:   Acute hypoxic respiratory failure (HCC)   Non-insulin dependent type 2 diabetes mellitus (HCC)   Essential hypertension   Acute hypoxemic respiratory failure (HCC)   Systolic CHF, acute (HCC)   Cardiomyopathy (HCC)   LBBB (left bundle branch block)   Mixed hyperlipidemia   Acute HFrEF (heart failure with reduced ejection fraction) (HCC)   Multifocal pneumonia - Flu, RSV, Covid negative  - Procalcitonin low  - Likely viral, continue supportive care - Respiratory viral panel still not been collected yet  Acute systolic HF - EF 23-53%  - Cardiology following, discussed with Dr. Albert Huff today - IV lasix, Jardiance - Cardiac MRI today - Heart cath tomorrow  Question LV thrombus - Heparin gtt  DM with hyperglycemia - Ha1c 15.4  - Semglee, NovoLog, SSI   Hypokalemia - Replace   DVT prophylaxis: IV hep SCDs Start: 07/19/23 0510 Place TED hose Start: 07/19/23 0510  Code Status: Full Family Communication: None at bedside  Disposition Plan: Home Status is: Inpatient Remains  inpatient appropriate because: IV heparin, IV lasix, heart cath tomorrow   Antimicrobials:  Anti-infectives (From admission, onward)    Start     Dose/Rate Route Frequency Ordered Stop   07/20/23 0100  cefTRIAXone (ROCEPHIN) 2 g in sodium chloride 0.9 % 100 mL IVPB  Status:  Discontinued        2 g 200 mL/hr over 30 Minutes Intravenous Every 24 hours 07/19/23 0509 07/20/23 1215   07/19/23 1300  doxycycline (VIBRAMYCIN) 100 mg in sodium chloride 0.9 % 250 mL IVPB  Status:  Discontinued        100 mg 125 mL/hr over 120 Minutes Intravenous Every 12 hours 07/19/23 0509 07/20/23 1215   07/19/23 0100  cefTRIAXone (ROCEPHIN) 1 g in sodium chloride 0.9 % 100 mL IVPB        1 g 200 mL/hr over 30 Minutes Intravenous  Once 07/19/23 0056 07/19/23 0157   07/19/23 0100  doxycycline (VIBRA-TABS) tablet 100 mg        100 mg Oral  Once 07/19/23 0056 07/19/23 0122        Objective: Vitals:   07/21/23 1330 07/21/23 1914 07/22/23 0524 07/22/23 0708  BP: 129/82 (!) 128/93 117/69   Pulse: 95 (!) 102 90   Resp:  20 20   Temp: 98 F (36.7 C) 98.3 F (36.8 C) 98.3 F (36.8 C)   TempSrc: Oral Oral Oral   SpO2: 97% 95% 96%   Weight:    61.4 kg  Height:        Intake/Output Summary (Last 24 hours) at 07/22/2023  1253 Last data filed at 07/21/2023 2227 Gross per 24 hour  Intake 480 ml  Output 850 ml  Net -370 ml   Filed Weights   07/20/23 0537 07/21/23 0429 07/22/23 0708  Weight: 61.3 kg 65.1 kg 61.4 kg    Examination:  General exam: Appears calm and comfortable  Respiratory system: Clear to auscultation. Respiratory effort normal. No respiratory distress. No conversational dyspnea.  Cardiovascular system: S1 & S2 heard, RRR. No murmurs. No pedal edema. Gastrointestinal system: Abdomen is nondistended, soft and nontender. Normal bowel sounds heard. Central nervous system: Alert and oriented. No focal neurological deficits. Speech clear.  Extremities: Symmetric in appearance  Skin: No  rashes, lesions or ulcers on exposed skin  Psychiatry: Judgement and insight appear normal. Mood & affect appropriate.   Data Reviewed: I have personally reviewed following labs and imaging studies  CBC: Recent Labs  Lab 07/18/23 2231 07/18/23 2237 07/19/23 0640 07/20/23 0403 07/21/23 0402 07/22/23 0411  WBC 11.9*  --  11.8* 7.3 6.8 5.2  NEUTROABS 9.9*  --   --   --   --   --   HGB 12.9 13.6 12.6 11.8* 11.3* 12.6  HCT 40.0 40.0 41.3 37.5 37.1 40.4  MCV 84.2  --  89.2 86.8 89.0 86.9  PLT 406*  --  355 322 331 369   Basic Metabolic Panel: Recent Labs  Lab 07/17/23 0829 07/18/23 2222 07/18/23 2237 07/20/23 0403 07/20/23 0703 07/20/23 1606 07/21/23 0402 07/22/23 0411  NA 135 135 134* 135  --   --  139 141  K 3.9 3.6 3.5 2.8*  --  4.4 3.4* 3.3*  CL 99 96*  --  98  --   --  105 105  CO2 25 25  --  27  --   --  24 26  GLUCOSE 357* 302*  --  181*  --   --  92 93  BUN 7 <5*  --  8  --   --  11 17  CREATININE 0.63 0.59  --  0.44  --   --  0.44 0.51  CALCIUM 9.4 9.3  --  8.3*  --   --  8.4* 8.7*  MG  --   --   --   --  1.7  --   --  1.8   GFR: Estimated Creatinine Clearance: 67.3 mL/min (by C-G formula based on SCr of 0.51 mg/dL). Liver Function Tests: Recent Labs  Lab 07/18/23 2222 07/20/23 0403  AST 37 16  ALT 71* 37  ALKPHOS 83 49  BILITOT 0.5 0.9  PROT 6.6 6.0*  ALBUMIN 3.5 2.6*   No results for input(s): "LIPASE", "AMYLASE" in the last 168 hours. No results for input(s): "AMMONIA" in the last 168 hours. Coagulation Profile: No results for input(s): "INR", "PROTIME" in the last 168 hours. Cardiac Enzymes: No results for input(s): "CKTOTAL", "CKMB", "CKMBINDEX", "TROPONINI" in the last 168 hours. BNP (last 3 results) Recent Labs    07/18/23 2222  PROBNP 2,515.0*   HbA1C: No results for input(s): "HGBA1C" in the last 72 hours.  CBG: Recent Labs  Lab 07/21/23 1144 07/21/23 1708 07/21/23 2115 07/22/23 0725 07/22/23 1244  GLUCAP 152* 119* 129* 89 83    Lipid Profile: Recent Labs    07/20/23 0403  CHOL 152  HDL 41  LDLCALC 96  TRIG 73  CHOLHDL 3.7   Thyroid Function Tests: No results for input(s): "TSH", "T4TOTAL", "FREET4", "T3FREE", "THYROIDAB" in the last 72 hours. Anemia Panel: No results  for input(s): "VITAMINB12", "FOLATE", "FERRITIN", "TIBC", "IRON", "RETICCTPCT" in the last 72 hours. Sepsis Labs: Recent Labs  Lab 07/18/23 2222 07/19/23 0640  PROCALCITON  --  <0.10  LATICACIDVEN 1.6  --     Recent Results (from the past 240 hours)  Resp panel by RT-PCR (RSV, Flu A&B, Covid) Anterior Nasal Swab     Status: None   Collection Time: 07/17/23  8:30 AM   Specimen: Anterior Nasal Swab  Result Value Ref Range Status   SARS Coronavirus 2 by RT PCR NEGATIVE NEGATIVE Final    Comment: (NOTE) SARS-CoV-2 target nucleic acids are NOT DETECTED.  The SARS-CoV-2 RNA is generally detectable in upper respiratory specimens during the acute phase of infection. The lowest concentration of SARS-CoV-2 viral copies this assay can detect is 138 copies/mL. A negative result does not preclude SARS-Cov-2 infection and should not be used as the sole basis for treatment or other patient management decisions. A negative result may occur with  improper specimen collection/handling, submission of specimen other than nasopharyngeal swab, presence of viral mutation(s) within the areas targeted by this assay, and inadequate number of viral copies(<138 copies/mL). A negative result must be combined with clinical observations, patient history, and epidemiological information. The expected result is Negative.  Fact Sheet for Patients:  BloggerCourse.com  Fact Sheet for Healthcare Providers:  SeriousBroker.it  This test is no t yet approved or cleared by the [place]  FDA and  has been authorized for detection and/or diagnosis of SARS-CoV-2 by FDA under an Emergency Use Authorization (EUA).  This EUA will remain  in effect (meaning this test can be used) for the duration of the COVID-19 declaration under Section 564(b)(1) of the Act, 21 U.S.C.section 360bbb-3(b)(1), unless the authorization is terminated  or revoked sooner.       Influenza A by PCR NEGATIVE NEGATIVE Final   Influenza B by PCR NEGATIVE NEGATIVE Final    Comment: (NOTE) The Xpert Xpress SARS-CoV-2/FLU/RSV plus assay is intended as an aid in the diagnosis of influenza from Nasopharyngeal swab specimens and should not be used as a sole basis for treatment. Nasal washings and aspirates are unacceptable for Xpert Xpress SARS-CoV-2/FLU/RSV testing.  Fact Sheet for Patients: BloggerCourse.com  Fact Sheet for Healthcare Providers: SeriousBroker.it  This test is not yet approved or cleared by the [place]  FDA and has been authorized for detection and/or diagnosis of SARS-CoV-2 by FDA under an Emergency Use Authorization (EUA). This EUA will remain in effect (meaning this test can be used) for the duration of the COVID-19 declaration under Section 564(b)(1) of the Act, 21 U.S.C. section 360bbb-3(b)(1), unless the authorization is terminated or revoked.     Resp Syncytial Virus by PCR NEGATIVE NEGATIVE Final    Comment: (NOTE) Fact Sheet for Patients: BloggerCourse.com  Fact Sheet for Healthcare Providers: SeriousBroker.it  This test is not yet approved or cleared by the [place]  FDA and has been authorized for detection and/or diagnosis of SARS-CoV-2 by FDA under an Emergency Use Authorization (EUA). This EUA will remain in effect (meaning this test can be used) for the duration of the COVID-19 declaration under Section 564(b)(1) of the Act, 21 U.S.C. section 360bbb-3(b)(1), unless the authorization is terminated or revoked.  Performed at [facility], [address]  [place], [place] [address]   Culture, blood (routine x 2)     Status: None (Preliminary result)   Collection Time: 07/18/23 10:09 PM   Specimen: BLOOD  Result Value Ref Range Status  Specimen Description   Final    BLOOD RIGHT ANTECUBITAL Performed at [facility], [address] [place], [place] [address]    Special Requests   Final    BOTTLES DRAWN AEROBIC AND ANAEROBIC Blood Culture results may not be optimal due to an inadequate volume of blood received in culture bottles Performed at [facility], [address] [place], [place] [address]    Culture   Final    NO GROWTH 3 DAYS Performed at [facility], [address], [place], [place] [address]    Report Status PENDING  Incomplete  Culture, blood (routine x 2) Call MD if unable to obtain prior to antibiotics being given     Status: None (Preliminary result)   Collection Time: 07/19/23  6:40 AM   Specimen: BLOOD  Result Value Ref Range Status   Specimen Description   Final    BLOOD BLOOD RIGHT ARM AEROBIC BOTTLE ONLY ANAEROBIC BOTTLE ONLY Performed at [facility], [address], [place], [place] [address]    Special Requests   Final    BOTTLES DRAWN AEROBIC AND ANAEROBIC Blood Culture results may not be optimal due to an inadequate volume of blood received in culture bottles Performed at [facility], [address], [place], [place] [address]    Culture   Final    NO GROWTH 3 DAYS Performed at [facility], [address], [place], [place] [address]    Report Status PENDING  Incomplete  Culture, blood (routine x 2) Call MD if unable to obtain prior to antibiotics being given     Status: None (Preliminary result)   Collection Time: 07/19/23  6:44 AM   Specimen: BLOOD  Result Value Ref Range Status   Specimen Description   Final    BLOOD BLOOD LEFT ARM AEROBIC BOTTLE ONLY ANAEROBIC BOTTLE ONLY Performed at [facility], [address], [place], [place] [address]    Special Requests   Final    BOTTLES DRAWN AEROBIC AND ANAEROBIC Blood Culture results may not be optimal due to an inadequate volume of blood received in culture bottles Performed at [facility], [address] [place], [place] [address]    Culture   Final    NO GROWTH 3 DAYS Performed at [facility], [address] [place], [place] [address]    Report Status PENDING  Incomplete      Radiology Studies: No results found.     Scheduled Meds:  empagliflozin  10 mg Oral Daily   furosemide  40 mg Intravenous Daily   insulin aspart  0-5 Units Subcutaneous QHS   insulin aspart  0-6 Units Subcutaneous TID WC   insulin aspart  3 Units Subcutaneous TID WC   insulin glargine-yfgn  15 Units Subcutaneous Daily   living well with diabetes book   Does not apply Once   potassium chloride  40 mEq Oral BID   rosuvastatin  20 mg Oral Daily   sodium chloride flush  3 mL Intravenous Q12H   sodium chloride flush  3 mL Intravenous Q12H   Continuous Infusions:  heparin 1,150 Units/hr (07/21/23 1600)     LOS: 2 days   Time spent: 25 minutes   Daren Eck, DO [facility] 07/22/2023, 12:53 PM   Available via Epic secure chat 7am-7pm After these hours, please refer to coverage provider listed on [email or web address]

## 2023-07-23 ENCOUNTER — Encounter (HOSPITAL_COMMUNITY)
Admission: EM | Disposition: A | Discharge status: Home / Self Care | Payer: Self-pay | Source: Home / Self Care | Attending: Internal Medicine

## 2023-07-23 DIAGNOSIS — I428 Other cardiomyopathies: Secondary | ICD-10-CM

## 2023-07-23 DIAGNOSIS — I447 Left bundle-branch block, unspecified: Secondary | ICD-10-CM | POA: Diagnosis not present

## 2023-07-23 DIAGNOSIS — I5021 Acute systolic (congestive) heart failure: Secondary | ICD-10-CM | POA: Diagnosis not present

## 2023-07-23 DIAGNOSIS — B348 Other viral infections of unspecified site: Secondary | ICD-10-CM

## 2023-07-23 DIAGNOSIS — E785 Hyperlipidemia, unspecified: Secondary | ICD-10-CM | POA: Diagnosis not present

## 2023-07-23 DIAGNOSIS — I10 Essential (primary) hypertension: Secondary | ICD-10-CM | POA: Diagnosis not present

## 2023-07-23 DIAGNOSIS — J189 Pneumonia, unspecified organism: Secondary | ICD-10-CM | POA: Diagnosis not present

## 2023-07-23 HISTORY — PX: RIGHT/LEFT HEART CATH AND CORONARY ANGIOGRAPHY: CATH118266

## 2023-07-23 LAB — BASIC METABOLIC PANEL WITH GFR
Anion gap: 10 (ref 5–15)
BUN: 16 mg/dL (ref 6–20)
CO2: 26 mmol/L (ref 22–32)
Calcium: 8.1 mg/dL — ABNORMAL LOW (ref 8.9–10.3)
Chloride: 102 mmol/L (ref 98–111)
Creatinine, Ser: 0.61 mg/dL (ref 0.44–1.00)
GFR, Estimated: >60 mL/min (ref >60)
Glucose, Bld: 115 mg/dL — ABNORMAL HIGH (ref 70–99)
Potassium: 3.9 mmol/L (ref 3.5–5.1)
Sodium: 138 mmol/L (ref 135–145)

## 2023-07-23 LAB — POCT I-STAT 7, (LYTES, BLD GAS, ICA,H+H)
Acid-Base Excess: 0 mmol/L (ref 0.0–2.0)
Bicarbonate: 25.8 mmol/L (ref 20.0–28.0)
Calcium, Ion: 1.24 mmol/L (ref 1.15–1.40)
HCT: 37 % (ref 36.0–46.0)
Hemoglobin: 12.6 g/dL (ref 12.0–15.0)
O2 Saturation: 93 %
Potassium: 3.8 mmol/L (ref 3.5–5.1)
Sodium: 141 mmol/L (ref 135–145)
TCO2: 27 mmol/L (ref 22–32)
pCO2 arterial: 44.2 mmHg (ref 32–48)
pH, Arterial: 7.374 (ref 7.35–7.45)
pO2, Arterial: 68 mmHg — ABNORMAL LOW (ref 83–108)

## 2023-07-23 LAB — CBC
HCT: 40.8 % (ref 36.0–46.0)
HCT: 44.2 % (ref 36.0–46.0)
Hemoglobin: 12.5 g/dL (ref 12.0–15.0)
Hemoglobin: 13.7 g/dL (ref 12.0–15.0)
MCH: 27.1 pg (ref 26.0–34.0)
MCH: 27.3 pg (ref 26.0–34.0)
MCHC: 30.6 g/dL (ref 30.0–36.0)
MCHC: 31 g/dL (ref 30.0–36.0)
MCV: 88.3 fL (ref 80.0–100.0)
MCV: 88.2 fL (ref 80.0–100.0)
Platelets: 411 10*3/uL — ABNORMAL HIGH (ref 150–400)
Platelets: 464 10*3/uL — ABNORMAL HIGH (ref 150–400)
RBC: 4.62 MIL/uL (ref 3.87–5.11)
RBC: 5.01 MIL/uL (ref 3.87–5.11)
RDW: 13.2 % (ref 11.5–15.5)
RDW: 13.3 % (ref 11.5–15.5)
WBC: 6.7 10*3/uL (ref 4.0–10.5)
WBC: 6.2 10*3/uL (ref 4.0–10.5)
nRBC: 0 % (ref 0.0–0.2)
nRBC: 0 % (ref 0.0–0.2)

## 2023-07-23 LAB — POCT I-STAT EG7
Acid-Base Excess: 1 mmol/L (ref 0.0–2.0)
Bicarbonate: 26.6 mmol/L (ref 20.0–28.0)
Calcium, Ion: 1.22 mmol/L (ref 1.15–1.40)
HCT: 36 % (ref 36.0–46.0)
Hemoglobin: 12.2 g/dL (ref 12.0–15.0)
O2 Saturation: 68 %
Potassium: 3.8 mmol/L (ref 3.5–5.1)
Sodium: 141 mmol/L (ref 135–145)
TCO2: 28 mmol/L (ref 22–32)
pCO2, Ven: 47.3 mmHg (ref 44–60)
pH, Ven: 7.358 (ref 7.25–7.43)
pO2, Ven: 37 mmHg (ref 32–45)

## 2023-07-23 LAB — GLUCOSE, CAPILLARY
Glucose-Capillary: 115 mg/dL — ABNORMAL HIGH (ref 70–99)
Glucose-Capillary: 89 mg/dL (ref 70–99)
Glucose-Capillary: 199 mg/dL — ABNORMAL HIGH (ref 70–99)
Glucose-Capillary: 164 mg/dL — ABNORMAL HIGH (ref 70–99)

## 2023-07-23 LAB — MAGNESIUM: Magnesium: 2.3 mg/dL (ref 1.7–2.4)

## 2023-07-23 LAB — HEPARIN LEVEL (UNFRACTIONATED): Heparin Unfractionated: 0.54 [IU]/mL (ref 0.30–0.70)

## 2023-07-23 SURGERY — RIGHT/LEFT HEART CATH AND CORONARY ANGIOGRAPHY
Anesthesia: LOCAL

## 2023-07-23 MED ORDER — HEPARIN (PORCINE) IN NACL 2000-0.9 UNIT/L-% IV SOLN
INTRAVENOUS | Status: DC | PRN
  Administered 2023-07-23: 1000 mL

## 2023-07-23 MED ORDER — SODIUM CHLORIDE 0.9% FLUSH: 3.0000 mL | INTRAVENOUS | Status: DC | PRN

## 2023-07-23 MED ORDER — ENOXAPARIN SODIUM 40 MG/0.4ML IJ SOSY: 40.0000 mg | PREFILLED_SYRINGE | INTRAMUSCULAR | Status: DC

## 2023-07-23 MED ORDER — FENTANYL CITRATE (PF) 100 MCG/2ML IJ SOLN
INTRAMUSCULAR | Status: DC | PRN
  Administered 2023-07-23: 25 ug via INTRAVENOUS

## 2023-07-23 MED ORDER — MIDAZOLAM HCL 2 MG/2ML IJ SOLN
INTRAMUSCULAR | Status: AC
  Filled 2023-07-23: qty 2

## 2023-07-23 MED ORDER — FUROSEMIDE 10 MG/ML IJ SOLN
20.0000 mg | Freq: Every day | INTRAMUSCULAR | Status: DC
  Administered 2023-07-24 – 2023-07-25 (×2): 20 mg via INTRAVENOUS
  Filled 2023-07-23 (×2): qty 2

## 2023-07-23 MED ORDER — SODIUM CHLORIDE 0.9 % IV SOLN: 250.0000 mL | INTRAVENOUS | Status: DC | PRN

## 2023-07-23 MED ORDER — SPIRONOLACTONE 12.5 MG HALF TABLET
12.5000 mg | ORAL_TABLET | Freq: Every morning | ORAL | Status: DC
  Administered 2023-07-23 – 2023-07-25 (×3): 12.5 mg via ORAL
  Filled 2023-07-23 (×4): qty 1

## 2023-07-23 MED ORDER — SODIUM CHLORIDE 0.9 % IV SOLN
INTRAVENOUS | Status: DC
Start: 2023-07-23 — End: 2023-07-23

## 2023-07-23 MED ORDER — IOHEXOL 350 MG/ML SOLN
INTRAVENOUS | Status: DC | PRN
  Administered 2023-07-23: 35 mL

## 2023-07-23 MED ORDER — LIDOCAINE HCL (PF) 1 % IJ SOLN
INTRAMUSCULAR | Status: DC | PRN
  Administered 2023-07-23 (×2): 2 mL via INTRADERMAL

## 2023-07-23 MED ORDER — SODIUM CHLORIDE 0.9 % IV SOLN: INTRAVENOUS | Status: DC

## 2023-07-23 MED ORDER — HEPARIN SODIUM (PORCINE) 1000 UNIT/ML IJ SOLN
INTRAMUSCULAR | Status: AC
  Filled 2023-07-23: qty 10

## 2023-07-23 MED ORDER — HYDRALAZINE HCL 20 MG/ML IJ SOLN: 10.0000 mg | INTRAMUSCULAR | Status: AC | PRN

## 2023-07-23 MED ORDER — SODIUM CHLORIDE 0.9% FLUSH
3.0000 mL | Freq: Two times a day (BID) | INTRAVENOUS | Status: DC
  Administered 2023-07-23 – 2023-07-24 (×3): 3 mL via INTRAVENOUS

## 2023-07-23 MED ORDER — HEPARIN SODIUM (PORCINE) 1000 UNIT/ML IJ SOLN
INTRAMUSCULAR | Status: DC | PRN
  Administered 2023-07-23: 3000 [IU] via INTRAVENOUS

## 2023-07-23 MED ORDER — LIDOCAINE HCL (PF) 1 % IJ SOLN
INTRAMUSCULAR | Status: AC
  Filled 2023-07-23: qty 30

## 2023-07-23 MED ORDER — FENTANYL CITRATE (PF) 100 MCG/2ML IJ SOLN
INTRAMUSCULAR | Status: AC
  Filled 2023-07-23: qty 2

## 2023-07-23 MED ORDER — VERAPAMIL HCL 2.5 MG/ML IV SOLN
INTRAVENOUS | Status: DC | PRN
  Administered 2023-07-23: 10 mL via INTRA_ARTERIAL

## 2023-07-23 MED ORDER — LOSARTAN POTASSIUM 25 MG PO TABS
25.0000 mg | ORAL_TABLET | Freq: Every day | ORAL | Status: DC
  Administered 2023-07-23 – 2023-07-25 (×3): 25 mg via ORAL
  Filled 2023-07-23 (×3): qty 1

## 2023-07-23 MED ORDER — LABETALOL HCL 5 MG/ML IV SOLN: 10.0000 mg | INTRAVENOUS | Status: AC | PRN

## 2023-07-23 MED ORDER — MIDAZOLAM HCL 2 MG/2ML IJ SOLN
INTRAMUSCULAR | Status: DC | PRN
  Administered 2023-07-23: 1 mg via INTRAVENOUS

## 2023-07-23 MED ORDER — ENOXAPARIN SODIUM 60 MG/0.6ML IJ SOSY
60.0000 mg | PREFILLED_SYRINGE | Freq: Two times a day (BID) | INTRAMUSCULAR | Status: DC
  Administered 2023-07-23 – 2023-07-26 (×6): 60 mg via SUBCUTANEOUS
  Filled 2023-07-23 (×6): qty 0.6

## 2023-07-23 MED ORDER — VERAPAMIL HCL 2.5 MG/ML IV SOLN
INTRAVENOUS | Status: AC
  Filled 2023-07-23: qty 2

## 2023-07-23 SURGICAL SUPPLY — 10 items
CATH BALLN WEDGE 5F 110CM (CATHETERS) IMPLANT
CATH INFINITI 5 FR JL3.5 (CATHETERS) IMPLANT
CATH INFINITI JR4 5F (CATHETERS) IMPLANT
DEVICE RAD COMP TR BAND LRG (VASCULAR PRODUCTS) IMPLANT
GLIDESHEATH SLEND SS 6F .021 (SHEATH) IMPLANT
GUIDEWIRE INQWIRE 1.5J.035X260 (WIRE) IMPLANT
PACK CARDIAC CATHETERIZATION (CUSTOM PROCEDURE TRAY) ×1 IMPLANT
SET ATX-X65L (MISCELLANEOUS) IMPLANT
SHEATH GLIDE SLENDER 4/5FR (SHEATH) IMPLANT
SHEATH PROBE COVER 6X72 (BAG) IMPLANT

## 2023-07-23 NOTE — Progress Notes (Signed)
 PROGRESS NOTE    Natasha Hudson  NGE:[id number] DOB: [birth year] DOA: 07/18/2023 PCP: Vicente Graham, No     Brief Narrative:  Natasha Hudson is a 58 year old female with diabetes mellitus and hypertension who presents to the hospital for shortness of breath, productive cough and is found to be hypoxic with a pulse ox of 86% on room air, tachypneic and tachycardic.  CT scan of the chest: Mild multifocal infiltrates in the upper lobe, large bilateral effusions and significant atelectasis.  Started on treatment for pneumonia and heart failure with ceftriaxone, doxycycline and furosemide.  Also found to have an EF of 25 to 30%, and A1c of 15.4.  Insulin teaching and cardiology consult requested.  New events last 24 hours / Subjective: Feeling well, cough improved   Assessment & Plan:   Principal Problem:   Multifocal pneumonia Active Problems:   Acute hypoxic respiratory failure (HCC)   Non-insulin dependent type 2 diabetes mellitus (HCC)   Essential hypertension   Acute hypoxemic respiratory failure (HCC)   Systolic CHF, acute (HCC)   Cardiomyopathy (HCC)   LBBB (left bundle branch block)   Mixed hyperlipidemia   Acute HFrEF (heart failure with reduced ejection fraction) (HCC)   Rhinovirus   Nonischemic cardiomyopathy (HCC)   Multifocal pneumonia - Flu, RSV, Covid negative  - Procalcitonin low  - Respiratory viral panel +Rhinovirus  - Continue supportive care   Acute systolic HF Nonischemic cardiomyopathy  - EF 25-30%  - Cardiology following, discussed with Dr. Albert Huff today - IV lasix, Jardiance, Losartan, Aldactone - TOC consult for med assistance  - Heart cath with normal coronary arteries   Question LV thrombus - Cardiac MRI "11 mm X 10 mm well circumscribed mass with attached to a central stalk to the pulmonary ridge. This is concerning for thrombus. Consider TEE evaluation." - Lovenox. TEE planned Monday   DM with hyperglycemia - Ha1c 15.4  - Semglee, NovoLog, SSI      DVT prophylaxis: Lovenox  SCDs Start: 07/19/23 0510 Place TED hose Start: 07/19/23 0510  Code Status: Full Family Communication: None at bedside  Disposition Plan: Home Status is: Inpatient Remains inpatient appropriate because: TEE Monday    Antimicrobials:  Anti-infectives (From admission, onward)    Start     Dose/Rate Route Frequency Ordered Stop   07/20/23 0100  cefTRIAXone (ROCEPHIN) 2 g in sodium chloride 0.9 % 100 mL IVPB  Status:  Discontinued        2 g 200 mL/hr over 30 Minutes Intravenous Every 24 hours 07/19/23 0509 07/20/23 1215   07/19/23 1300  doxycycline (VIBRAMYCIN) 100 mg in sodium chloride 0.9 % 250 mL IVPB  Status:  Discontinued        100 mg 125 mL/hr over 120 Minutes Intravenous Every 12 hours 07/19/23 0509 07/20/23 1215   07/19/23 0100  cefTRIAXone (ROCEPHIN) 1 g in sodium chloride 0.9 % 100 mL IVPB        1 g 200 mL/hr over 30 Minutes Intravenous  Once 07/19/23 0056 07/19/23 0157   07/19/23 0100  doxycycline (VIBRA-TABS) tablet 100 mg        100 mg Oral  Once 07/19/23 0056 07/19/23 0122        Objective: Vitals:   07/23/23 1130 07/23/23 1140 07/23/23 1145 07/23/23 1225  BP:   128/86 133/82  Pulse: 94 92 92 88  Resp: 13 (!) 27 13 14   Temp:    98.2 F (36.8 C)  TempSrc:    Oral  SpO2: 97% 96% 96% 98%  Weight:      Height:        Intake/Output Summary (Last 24 hours) at 07/23/2023 1453 Last data filed at 07/23/2023 1000 Gross per 24 hour  Intake 120 ml  Output --  Net 120 ml   Filed Weights   07/21/23 0429 07/22/23 0708 07/23/23 0500  Weight: 65.1 kg 61.4 kg 60.8 kg    Examination:  General exam: Appears calm and comfortable  Respiratory system: Clear to auscultation. Respiratory effort normal. No respiratory distress. No conversational dyspnea.  Cardiovascular system: S1 & S2 heard, RRR. No murmurs. No pedal edema. Gastrointestinal system: Abdomen is nondistended, soft and nontender. Normal bowel sounds heard. Central nervous  system: Alert and oriented. No focal neurological deficits. Speech clear.  Extremities: Symmetric in appearance  Skin: No rashes, lesions or ulcers on exposed skin  Psychiatry: Judgement and insight appear normal. Mood & affect appropriate.   Data Reviewed: I have personally reviewed following labs and imaging studies  CBC: Recent Labs  Lab 07/18/23 2231 07/18/23 2237 07/20/23 0403 07/21/23 0402 07/22/23 0411 07/23/23 0345 07/23/23 0947 07/23/23 1300  WBC 11.9*   < > 7.3 6.8 5.2 6.7  --  6.2  NEUTROABS 9.9*  --   --   --   --   --   --   --   HGB 12.9   < > 11.8* 11.3* 12.6 12.5 12.2 13.7  HCT 40.0   < > 37.5 37.1 40.4 40.8 36.0 44.2  MCV 84.2   < > 86.8 89.0 86.9 88.3  --  88.2  PLT 406*   < > 322 331 369 411*  --  464*   < > = values in this interval not displayed.   Basic Metabolic Panel: Recent Labs  Lab 07/18/23 2222 07/18/23 2237 07/20/23 0403 07/20/23 0703 07/20/23 1606 07/21/23 0402 07/22/23 0411 07/23/23 0340 07/23/23 0345 07/23/23 0947  NA 135   < > 135  --   --  139 141  --  138 141  K 3.6   < > 2.8*  --  4.4 3.4* 3.3*  --  3.9 3.8  CL 96*  --  98  --   --  105 105  --  102  --   CO2 25  --  27  --   --  24 26  --  26  --   GLUCOSE 302*  --  181*  --   --  92 93  --  115*  --   BUN <5*  --  8  --   --  11 17  --  16  --   CREATININE 0.59  --  0.44  --   --  0.44 0.51  --  0.61  --   CALCIUM 9.3  --  8.3*  --   --  8.4* 8.7*  --  8.1*  --   MG  --   --   --  1.7  --   --  1.8 2.3  --   --    < > = values in this interval not displayed.   GFR: Estimated Creatinine Clearance: 67.3 mL/min (by C-G formula based on SCr of 0.61 mg/dL). Liver Function Tests: Recent Labs  Lab 07/18/23 2222 07/20/23 0403  AST 37 16  ALT 71* 37  ALKPHOS 83 49  BILITOT 0.5 0.9  PROT 6.6 6.0*  ALBUMIN 3.5 2.6*   No results for input(s): "LIPASE", "AMYLASE" in the last 168 hours. No results for  input(s): "AMMONIA" in the last 168 hours. Coagulation Profile: No results  for input(s): "INR", "PROTIME" in the last 168 hours. Cardiac Enzymes: No results for input(s): "CKTOTAL", "CKMB", "CKMBINDEX", "TROPONINI" in the last 168 hours. BNP (last 3 results) Recent Labs    07/18/23 2222  PROBNP 2,515.0*   HbA1C: No results for input(s): "HGBA1C" in the last 72 hours.  CBG: Recent Labs  Lab 07/22/23 1244 07/22/23 1641 07/22/23 2019 07/23/23 0752 07/23/23 1231  GLUCAP 83 162* 181* 115* 89   Lipid Profile: No results for input(s): "CHOL", "HDL", "LDLCALC", "TRIG", "CHOLHDL", "LDLDIRECT" in the last 72 hours.  Thyroid Function Tests: No results for input(s): "TSH", "T4TOTAL", "FREET4", "T3FREE", "THYROIDAB" in the last 72 hours. Anemia Panel: No results for input(s): "VITAMINB12", "FOLATE", "FERRITIN", "TIBC", "IRON", "RETICCTPCT" in the last 72 hours. Sepsis Labs: Recent Labs  Lab 07/18/23 2222 07/19/23 0640  PROCALCITON  --  <0.10  LATICACIDVEN 1.6  --     Recent Results (from the past 240 hours)  Resp panel by RT-PCR (RSV, Flu A&B, Covid) Anterior Nasal Swab     Status: None   Collection Time: 07/17/23  8:30 AM   Specimen: Anterior Nasal Swab  Result Value Ref Range Status   SARS Coronavirus 2 by RT PCR NEGATIVE NEGATIVE Final    Comment: (NOTE) SARS-CoV-2 target nucleic acids are NOT DETECTED.  The SARS-CoV-2 RNA is generally detectable in upper respiratory specimens during the acute phase of infection. The lowest concentration of SARS-CoV-2 viral copies this assay can detect is 138 copies/mL. A negative result does not preclude SARS-Cov-2 infection and should not be used as the sole basis for treatment or other patient management decisions. A negative result may occur with  improper specimen collection/handling, submission of specimen other than nasopharyngeal swab, presence of viral mutation(s) within the areas targeted by this assay, and inadequate number of viral copies(<138 copies/mL). A negative result must be combined  with clinical observations, patient history, and epidemiological information. The expected result is Negative.  Fact Sheet for Patients:  BloggerCourse.com  Fact Sheet for Healthcare Providers:  SeriousBroker.it  This test is no t yet approved or cleared by the [place]  FDA and  has been authorized for detection and/or diagnosis of SARS-CoV-2 by FDA under an Emergency Use Authorization (EUA). This EUA will remain  in effect (meaning this test can be used) for the duration of the COVID-19 declaration under Section 564(b)(1) of the Act, 21 U.S.C.section 360bbb-3(b)(1), unless the authorization is terminated  or revoked sooner.       Influenza A by PCR NEGATIVE NEGATIVE Final   Influenza B by PCR NEGATIVE NEGATIVE Final    Comment: (NOTE) The Xpert Xpress SARS-CoV-2/FLU/RSV plus assay is intended as an aid in the diagnosis of influenza from Nasopharyngeal swab specimens and should not be used as a sole basis for treatment. Nasal washings and aspirates are unacceptable for Xpert Xpress SARS-CoV-2/FLU/RSV testing.  Fact Sheet for Patients: BloggerCourse.com  Fact Sheet for Healthcare Providers: SeriousBroker.it  This test is not yet approved or cleared by the [place]  FDA and has been authorized for detection and/or diagnosis of SARS-CoV-2 by FDA under an Emergency Use Authorization (EUA). This EUA will remain in effect (meaning this test can be used) for the duration of the COVID-19 declaration under Section 564(b)(1) of the Act, 21 U.S.C. section 360bbb-3(b)(1), unless the authorization is terminated or revoked.     Resp Syncytial Virus by PCR NEGATIVE NEGATIVE Final    Comment: (NOTE) Fact Sheet for  Patients: BloggerCourse.com  Fact Sheet for Healthcare Providers: SeriousBroker.it  This test is not yet approved  or cleared by the [place]  FDA and has been authorized for detection and/or diagnosis of SARS-CoV-2 by FDA under an Emergency Use Authorization (EUA). This EUA will remain in effect (meaning this test can be used) for the duration of the COVID-19 declaration under Section 564(b)(1) of the Act, 21 U.S.C. section 360bbb-3(b)(1), unless the authorization is terminated or revoked.  Performed at [facility], [address] [place], [place] [address]   Culture, blood (routine x 2)     Status: None (Preliminary result)   Collection Time: 07/18/23 10:09 PM   Specimen: BLOOD  Result Value Ref Range Status   Specimen Description   Final    BLOOD RIGHT ANTECUBITAL Performed at [facility], [address] [place], [place] [address]    Special Requests   Final    BOTTLES DRAWN AEROBIC AND ANAEROBIC Blood Culture results may not be optimal due to an inadequate volume of blood received in culture bottles Performed at [facility], [address] [place], [place] [address]    Culture   Final    NO GROWTH 4 DAYS Performed at [facility], [address] [place], [place] [address]    Report Status PENDING  Incomplete  Culture, blood (routine x 2) Call MD if unable to obtain prior to antibiotics being given     Status: None (Preliminary result)   Collection Time: 07/19/23  6:40 AM   Specimen: BLOOD  Result Value Ref Range Status   Specimen Description   Final    BLOOD BLOOD RIGHT ARM AEROBIC BOTTLE ONLY ANAEROBIC BOTTLE ONLY Performed at [facility], [address], [place], [place] [address]    Special Requests   Final    BOTTLES DRAWN AEROBIC AND ANAEROBIC Blood Culture results may not be optimal due to an inadequate volume of blood received in culture bottles Performed at [facility], [address] [place], [place] [address]    Culture   Final    NO GROWTH 4 DAYS Performed at [facility],  [address] [place], [place] [address]    Report Status PENDING  Incomplete  Culture, blood (routine x 2) Call MD if unable to obtain prior to antibiotics being given     Status: None (Preliminary result)   Collection Time: 07/19/23  6:44 AM   Specimen: BLOOD  Result Value Ref Range Status   Specimen Description   Final    BLOOD BLOOD LEFT ARM AEROBIC BOTTLE ONLY ANAEROBIC BOTTLE ONLY Performed at [facility], [address] [place], [place] [address]    Special Requests   Final    BOTTLES DRAWN AEROBIC AND ANAEROBIC Blood Culture results may not be optimal due to an inadequate volume of blood received in culture bottles Performed at [facility], [address], [place], [place] [address]    Culture   Final    NO GROWTH 4 DAYS Performed at [facility], [address] [place], [place] [address]    Report Status PENDING  Incomplete  Respiratory (~20 pathogens) panel by PCR     Status: Abnormal   Collection Time: 07/22/23  1:37 PM   Specimen: Nasopharyngeal Swab; Respiratory  Result Value Ref Range Status   Adenovirus NOT DETECTED NOT DETECTED Final   Coronavirus 229E NOT DETECTED NOT  DETECTED Final    Comment: (NOTE) The Coronavirus on the Respiratory Panel, DOES NOT test for the novel  Coronavirus (2019 nCoV)    Coronavirus HKU1 NOT DETECTED NOT DETECTED Final   Coronavirus NL63 NOT DETECTED NOT DETECTED Final   Coronavirus OC43 NOT DETECTED NOT DETECTED Final   Metapneumovirus NOT DETECTED NOT DETECTED Final   Rhinovirus / Enterovirus DETECTED (A) NOT DETECTED Final   Influenza A NOT DETECTED NOT DETECTED Final   Influenza B NOT DETECTED NOT DETECTED Final   Parainfluenza Virus 1 NOT DETECTED NOT DETECTED Final   Parainfluenza Virus 2 NOT DETECTED NOT DETECTED Final   Parainfluenza Virus 3 NOT DETECTED NOT DETECTED Final   Parainfluenza Virus 4 NOT DETECTED NOT DETECTED Final   Respiratory Syncytial Virus NOT DETECTED NOT  DETECTED Final   Bordetella pertussis NOT DETECTED NOT DETECTED Final   Bordetella Parapertussis NOT DETECTED NOT DETECTED Final   Chlamydophila pneumoniae NOT DETECTED NOT DETECTED Final   Mycoplasma pneumoniae NOT DETECTED NOT DETECTED Final    Comment: Performed at [facility], [address], [place], [place] [address]  Expectorated Sputum Assessment w Gram Stain, Rflx to Resp Cult     Status: None   Collection Time: 07/22/23  2:37 PM   Specimen: Expectorated Sputum  Result Value Ref Range Status   Specimen Description EXPECTORATED SPUTUM  Final   Special Requests NONE  Final   Sputum evaluation   Final    THIS SPECIMEN IS ACCEPTABLE FOR SPUTUM CULTURE Performed at [facility], [address], [place], [place] [address]    Report Status 07/22/2023 FINAL  Final  Culture, Respiratory w Gram Stain     Status: None (Preliminary result)   Collection Time: 07/22/23  2:37 PM  Result Value Ref Range Status   Specimen Description   Final    EXPECTORATED SPUTUM Performed at [facility], [address], [place], [place] [address]    Special Requests   Final    NONE Reflexed from [id number] Performed at [facility], [address] [place], [place] [address]    Gram Stain   Final    FEW WBC PRESENT, PREDOMINANTLY PMN FEW BUDDING YEAST SEEN RARE GRAM POSITIVE COCCI IN CHAINS Performed at [facility], [address] [place], [place] [address]    Culture PENDING  Incomplete   Report Status PENDING  Incomplete      Radiology Studies: CARDIAC CATHETERIZATION Result Date: 07/23/2023 1.  Angiographically normal coronary arteries (right dominant) 2.  Right heart catheterization data: Right atrial pressure mean 3 mmHg RV pressure 32 over 3 mmHg PA pressure 35/14 mean 20 mmHg Pulmonary capillary wedge pressure A-wave 14, V wave 15, mean 13 mmHg LVEDP 21 mmHg Cardiac output 5.2 L/min, cardiac index 3.1 L/min/m Conclusion:  Patient with normal coronary arteries and well compensated hemodynamics   MR CARDIAC MORPHOLOGY W WO CONTRAST Result Date: 07/22/2023 CLINICAL DATA:  Clinical question of left ventricular thrombus Study assumes HCT of 40 and BSA of 1.68 m2. EXAM: CARDIAC MRI TECHNIQUE: The patient was scanned on a 1.5 Tesla GE magnet. A dedicated cardiac coil was used. Functional imaging was done using Fiesta sequences. 2,3, and 4 chamber views were done to assess for RWMA's. Modified Simpson's rule using was used to calculate an ejection fraction on a dedicated work [employer], [employer]. The patient received 9 cc of Gadavist. After 10 minutes inversion recovery sequences were used to assess for infiltration and  scar tissue. Flow quantification was performed 2 times during this examination with flow quantification performed at the levels of the ascending aorta above the valve, pulmonary artery above the valve. CONTRAST:  9 cc  of Gadavist FINDINGS: 1. Moderate increase in left ventricular size, with LVEDD 56 mm, and LVEDVi 103 mL/m2. Mild left ventricular hypertrophy with septal thickness 11 mm and mass index of 67 g/m2. Severe decrease in left ventricular systolic function (LVEF =27%). Abnormal septal motion. Apical septal dyskinesis. Otherwise global dysfunction. Left ventricular parametric mapping notable for mild increase in native T1 (1212 ms) in the basal inferoseptal and normal T2. Cannot perform ECV due to post T2 acquisition. There is no late gadolinium enhancement in the left ventricular myocardium. No LV apical thrombus noted. 2. Normal right ventricular size with RVEDVI 44 mL/m2. Normal right ventricular thickness. Normal right ventricular systolic function (RVEF =45%). Septal hypokinesis. 3. Normal right atrial size. Moderate left atrial dilation, indexed volume 43 ml/m2. There is a 11 mm X 10 mm well circumscribed mass with attached to a central stalk to the pulmonary ridge. An thrombus. This is seen on the  four chamber view and in the LGE sequences. 4. Normal size of the aortic root, ascending aorta and pulmonary artery. 5. Valve assessment: Aortic Valve: Valve morphology is trileaflet. Qualitatively, there is no significant regurgitation. Regurgitant fraction 7%. Suggestive of mild regurgitation. Mean gradient 1.42 Mm Hg. Pulmonic Valve: Qualitatively, there is mild regurgitation. Regurgitant fraction 7%. Suggestive of mild regurgitation. Tricuspid Valve: Qualitatively, there is no significant regurgitation. Regurgitant fraction 8%. Suggestive of mild regurgitation. Mitral Valve: Multiple jets. Qualitatively, there is significant regurgitation. Regurgitant fraction 42%. Suggestive of severe regurgitation. 6. Normal pericardium. Moderate pericardial effusion overlying the right atrial and ventricular without increase in pericardial pressures. 7. Bilateral pleural effusions. Recommended dedicated study if concerned for non-cardiac pathology. 8. Lash Artifacts noted. This decreased the sensitivity of LGE acquisitions IMPRESSION: 1. Severe decrease in left ventricular systolic function (LVEF =27%). No LV apical thrombus noted. Moderate dilation. 2. Normal right ventricular systolic function (RVEF =45%). 3. There is a 11 mm X 10 mm well circumscribed mass with attached to a central stalk to the pulmonary ridge. This is concerning for thrombus. Consider TEE evaluation. 4. Qualitatively, there is significant mitral regurgitation. Regurgitant fraction 42%. Suggestive of severe regurgitation. Gloriann Larger MD Electronically Signed   By: Gloriann Larger M.D.   On: 07/22/2023 16:25   MR CARDIAC VELOCITY FLOW MAP Result Date: 07/22/2023 CLINICAL DATA:  Clinical question of left ventricular thrombus Study assumes HCT of 40 and BSA of 1.68 m2. EXAM: CARDIAC MRI TECHNIQUE: The patient was scanned on a 1.5 Tesla GE magnet. A dedicated cardiac coil was used. Functional imaging was done using Fiesta sequences. 2,3,  and 4 chamber views were done to assess for RWMA's. Modified Simpson's rule using was used to calculate an ejection fraction on a dedicated work [employer] officer, [employer]. The patient received 9 cc of Gadavist. After 10 minutes inversion recovery sequences were used to assess for infiltration and scar tissue. Flow quantification was performed 2 times during this examination with flow quantification performed at the levels of the ascending aorta above the valve, pulmonary artery above the valve. CONTRAST:  9 cc  of Gadavist FINDINGS: 1. Moderate increase in left ventricular size, with LVEDD 56 mm, and LVEDVi 103 mL/m2. Mild left ventricular hypertrophy with septal thickness 11 mm and mass index of 67 g/m2. Severe decrease in left ventricular systolic function (LVEF =27%). Abnormal septal  motion. Apical septal dyskinesis. Otherwise global dysfunction. Left ventricular parametric mapping notable for mild increase in native T1 (1212 ms) in the basal inferoseptal and normal T2. Cannot perform ECV due to post T2 acquisition. There is no late gadolinium enhancement in the left ventricular myocardium. No LV apical thrombus noted. 2. Normal right ventricular size with RVEDVI 44 mL/m2. Normal right ventricular thickness. Normal right ventricular systolic function (RVEF =45%). Septal hypokinesis. 3. Normal right atrial size. Moderate left atrial dilation, indexed volume 43 ml/m2. There is a 11 mm X 10 mm well circumscribed mass with attached to a central stalk to the pulmonary ridge. An thrombus. This is seen on the four chamber view and in the LGE sequences. 4. Normal size of the aortic root, ascending aorta and pulmonary artery. 5. Valve assessment: Aortic Valve: Valve morphology is trileaflet. Qualitatively, there is no significant regurgitation. Regurgitant fraction 7%. Suggestive of mild regurgitation. Mean gradient 1.42 Mm Hg. Pulmonic Valve: Qualitatively, there is mild regurgitation. Regurgitant fraction 7%.  Suggestive of mild regurgitation. Tricuspid Valve: Qualitatively, there is no significant regurgitation. Regurgitant fraction 8%. Suggestive of mild regurgitation. Mitral Valve: Multiple jets. Qualitatively, there is significant regurgitation. Regurgitant fraction 42%. Suggestive of severe regurgitation. 6. Normal pericardium. Moderate pericardial effusion overlying the right atrial and ventricular without increase in pericardial pressures. 7. Bilateral pleural effusions. Recommended dedicated study if concerned for non-cardiac pathology. 8. Lash Artifacts noted. This decreased the sensitivity of LGE acquisitions IMPRESSION: 1. Severe decrease in left ventricular systolic function (LVEF =27%). No LV apical thrombus noted. Moderate dilation. 2. Normal right ventricular systolic function (RVEF =45%). 3. There is a 11 mm X 10 mm well circumscribed mass with attached to a central stalk to the pulmonary ridge. This is concerning for thrombus. Consider TEE evaluation. 4. Qualitatively, there is significant mitral regurgitation. Regurgitant fraction 42%. Suggestive of severe regurgitation. Gloriann Larger MD Electronically Signed   By: Gloriann Larger M.D.   On: 07/22/2023 16:25   MR CARDIAC VELOCITY FLOW MAP Result Date: 07/22/2023 CLINICAL DATA:  Clinical question of left ventricular thrombus Study assumes HCT of 40 and BSA of 1.68 m2. EXAM: CARDIAC MRI TECHNIQUE: The patient was scanned on a 1.5 Tesla GE magnet. A dedicated cardiac coil was used. Functional imaging was done using Fiesta sequences. 2,3, and 4 chamber views were done to assess for RWMA's. Modified Simpson's rule using was used to calculate an ejection fraction on a dedicated work [employer] officer, [employer]. The patient received 9 cc of Gadavist. After 10 minutes inversion recovery sequences were used to assess for infiltration and scar tissue. Flow quantification was performed 2 times during this examination with flow quantification  performed at the levels of the ascending aorta above the valve, pulmonary artery above the valve. CONTRAST:  9 cc  of Gadavist FINDINGS: 1. Moderate increase in left ventricular size, with LVEDD 56 mm, and LVEDVi 103 mL/m2. Mild left ventricular hypertrophy with septal thickness 11 mm and mass index of 67 g/m2. Severe decrease in left ventricular systolic function (LVEF =27%). Abnormal septal motion. Apical septal dyskinesis. Otherwise global dysfunction. Left ventricular parametric mapping notable for mild increase in native T1 (1212 ms) in the basal inferoseptal and normal T2. Cannot perform ECV due to post T2 acquisition. There is no late gadolinium enhancement in the left ventricular myocardium. No LV apical thrombus noted. 2. Normal right ventricular size with RVEDVI 44 mL/m2. Normal right ventricular thickness. Normal right ventricular systolic function (RVEF =45%). Septal hypokinesis. 3. Normal right atrial size. Moderate  left atrial dilation, indexed volume 43 ml/m2. There is a 11 mm X 10 mm well circumscribed mass with attached to a central stalk to the pulmonary ridge. An thrombus. This is seen on the four chamber view and in the LGE sequences. 4. Normal size of the aortic root, ascending aorta and pulmonary artery. 5. Valve assessment: Aortic Valve: Valve morphology is trileaflet. Qualitatively, there is no significant regurgitation. Regurgitant fraction 7%. Suggestive of mild regurgitation. Mean gradient 1.42 Mm Hg. Pulmonic Valve: Qualitatively, there is mild regurgitation. Regurgitant fraction 7%. Suggestive of mild regurgitation. Tricuspid Valve: Qualitatively, there is no significant regurgitation. Regurgitant fraction 8%. Suggestive of mild regurgitation. Mitral Valve: Multiple jets. Qualitatively, there is significant regurgitation. Regurgitant fraction 42%. Suggestive of severe regurgitation. 6. Normal pericardium. Moderate pericardial effusion overlying the right atrial and ventricular without  increase in pericardial pressures. 7. Bilateral pleural effusions. Recommended dedicated study if concerned for non-cardiac pathology. 8. Lash Artifacts noted. This decreased the sensitivity of LGE acquisitions IMPRESSION: 1. Severe decrease in left ventricular systolic function (LVEF =27%). No LV apical thrombus noted. Moderate dilation. 2. Normal right ventricular systolic function (RVEF =45%). 3. There is a 11 mm X 10 mm well circumscribed mass with attached to a central stalk to the pulmonary ridge. This is concerning for thrombus. Consider TEE evaluation. 4. Qualitatively, there is significant mitral regurgitation. Regurgitant fraction 42%. Suggestive of severe regurgitation. Gloriann Larger MD Electronically Signed   By: Gloriann Larger M.D.   On: 07/22/2023 16:25       Scheduled Meds:  empagliflozin  10 mg Oral Daily   enoxaparin (LOVENOX) injection  60 mg Subcutaneous Q12H   [START ON 07/24/2023] furosemide  20 mg Intravenous Daily   insulin aspart  0-5 Units Subcutaneous QHS   insulin aspart  0-6 Units Subcutaneous TID WC   insulin aspart  3 Units Subcutaneous TID WC   insulin glargine-yfgn  15 Units Subcutaneous Daily   living well with diabetes book   Does not apply Once   losartan  25 mg Oral Q2200   rosuvastatin  20 mg Oral Daily   sodium chloride flush  3 mL Intravenous Q12H   sodium chloride flush  3 mL Intravenous Q12H   sodium chloride flush  3 mL Intravenous Q12H   spironolactone  12.5 mg Oral q morning   Continuous Infusions:  sodium chloride       LOS: 3 days   Time spent: 25 minutes   Daren Eck, DO [facility] 07/23/2023, 2:53 PM   Available via Epic secure chat 7am-7pm After these hours, please refer to coverage provider listed on [email or web address]

## 2023-07-23 NOTE — Progress Notes (Signed)
   [facility] has been requested to perform a transesophageal echocardiogram on Harolyn Likes for moderate to severe mitral regurgitation and left atrial appendage thrombus.     The patient does NOT have any absolute or relative contraindications to a Transesophageal Echocardiogram (TEE).  The patient has: History of Severe Valve Disease (stenosis or regurgitation)    After careful review of history and examination, the risks and benefits of transesophageal echocardiogram have been explained including risks of esophageal damage, perforation (1:10,000 risk), bleeding, pharyngeal hematoma as well as other potential complications associated with conscious sedation including aspiration, arrhythmia, respiratory failure and death. Alternatives to treatment were discussed, questions were answered. Patient is willing to proceed.   Eloy Half, PA-C  07/23/2023 4:58 PM

## 2023-07-23 NOTE — Interval H&P Note (Signed)
 History and Physical Interval Note:  07/23/2023 8:11 AM  Natasha Hudson  has presented today for surgery, with the diagnosis of heart failure.  The various methods of treatment have been discussed with the patient and family. After consideration of risks, benefits and other options for treatment, the patient has consented to  Procedure(s): RIGHT/LEFT HEART CATH AND CORONARY ANGIOGRAPHY (N/A) as a surgical intervention.  The patient's history has been reviewed, patient examined, no change in status, stable for surgery.  I have reviewed the patient's chart and labs.  Questions were answered to the patient's satisfaction.     Arnoldo Lapping

## 2023-07-23 NOTE — Progress Notes (Signed)
 Morning Rounds:   Not here at [facility] - she was transported to [facility] for her Left and Right Heart cath.   Will try to see her at [facility].   No charge.   Ashaad Gaertner, DO, FACC 9:07 AM

## 2023-07-23 NOTE — Progress Notes (Signed)
 Patient admitted to pre-procedure.  Consent and aspirin administration was verified.  Patient had 2 patent IVS.  One present on RUA which appeared to be adequate for RHC / placed at [facility] with US  guidance.  Right groin was clipped for procedure as back-up site.  Dr. Copper spoke with patient at the bedside concerning the procedure and addressed any questions/concerns.  Patient advised she would like her daughter called post procedure for an update.  Contact number is in patient's chart.

## 2023-07-23 NOTE — Progress Notes (Signed)
 [facility] at bedside to take patient to [facility] hospital.  TR band was removed. Site was clean, dry intact.  Gauze and tegaderm applied to site.  Patient had no belongings with her.

## 2023-07-23 NOTE — Plan of Care (Incomplete)
  Problem: Education: Goal: Knowledge of General Education information will improve Description: Including pain rating scale, medication(s)/side effects and non-pharmacologic comfort measures Outcome: Progressing   Problem: Clinical Measurements: Goal: Diagnostic test results will improve Outcome: Progressing Goal: Respiratory complications will improve Outcome: Progressing Goal: Cardiovascular complication will be avoided Outcome: Progressing   Problem: Nutrition: Goal: Adequate nutrition will be maintained Outcome: Progressing   Problem: Coping: Goal: Level of anxiety will decrease Outcome: Progressing   Problem: Education: Goal: Ability to demonstrate management of disease process will improve Outcome: Progressing Goal: Ability to verbalize understanding of medication therapies will improve Outcome: Progressing   Problem: Cardiac: Goal: Ability to achieve and maintain adequate cardiopulmonary perfusion will improve Outcome: Progressing   Problem: Activity: Goal: Ability to tolerate increased activity will improve Outcome: Progressing   Problem: Coping: Goal: Ability to adjust to condition or change in health will improve Outcome: Progressing   Problem: Fluid Volume: Goal: Ability to maintain a balanced intake and output will improve Outcome: Progressing   Problem: Metabolic: Goal: Ability to maintain appropriate glucose levels will improve Outcome: Progressing   Problem: Tissue Perfusion: Goal: Adequacy of tissue perfusion will improve Outcome: Progressing   Problem: Clinical Measurements: Goal: Ability to maintain clinical measurements within normal limits will improve Outcome: Adequate for Discharge Goal: Will remain free from infection Outcome: Adequate for Discharge   Problem: Activity: Goal: Risk for activity intolerance will decrease Outcome: Adequate for Discharge   Problem: Elimination: Goal: Will not experience complications related to bowel  motility Outcome: Adequate for Discharge Goal: Will not experience complications related to urinary retention Outcome: Adequate for Discharge   Problem: Pain Managment: Goal: General experience of comfort will improve and/or be controlled Outcome: Adequate for Discharge   Problem: Safety: Goal: Ability to remain free from injury will improve Outcome: Adequate for Discharge   Problem: Skin Integrity: Goal: Risk for impaired skin integrity will decrease Outcome: Adequate for Discharge   Problem: Activity: Goal: Capacity to carry out activities will improve Outcome: Adequate for Discharge   Problem: Respiratory: Goal: Ability to maintain adequate ventilation will improve Outcome: Adequate for Discharge   Problem: Skin Integrity: Goal: Risk for impaired skin integrity will decrease Outcome: Adequate for Discharge

## 2023-07-23 NOTE — Progress Notes (Signed)
 PHARMACY - ANTICOAGULATION CONSULT NOTE  Pharmacy Consult for heparin >> Lovenox Indication: possible LV thrombus  Allergies  Allergen Reactions   Metformin Diarrhea    Only a high dose    Patient Measurements: Height: 5\' 5"  (165.1 cm) Weight: 60.8 kg (134 lb 0.6 oz) IBW/kg (Calculated) : 57 HEPARIN DW (KG): 70.3  Vital Signs: Temp: 98.2 F (36.8 C) (05/16 1225) Temp Source: Oral (05/16 1225) BP: 133/82 (05/16 1225) Pulse Rate: 88 (05/16 1225)  Labs: Recent Labs    07/21/23 0402 07/21/23 1238 07/21/23 1827 07/22/23 0411 07/23/23 0345 07/23/23 0947 07/23/23 1300  HGB 11.3*  --   --  12.6 12.5 12.2 13.7  HCT 37.1  --   --  40.4 40.8 36.0 44.2  PLT 331  --   --  369 411*  --  464*  HEPARINUNFRC 0.26*   < > 0.32 0.40 0.54  --   --   CREATININE 0.44  --   --  0.51 0.61  --   --    < > = values in this interval not displayed.    Estimated Creatinine Clearance: 67.3 mL/min (by C-G formula based on SCr of 0.61 mg/dL).   Medical History: Past Medical History:  Diagnosis Date   Diabetes mellitus without complication (HCC)    Hypertension     Medications:  5/12-5/13 Lovenox 40 mg - last administered 5/13 10:51  Assessment: 58 year old female presented with shortness of breath and cough, found to be hypoxic on room air. Treating for respiratory failure, multifactorial in setting of HF and pneumonia. ECHO revealed EF 25-30% and was also concerning for possible small LV apical thrombus. Pharmacy originally consulted for heparin.  Heart cath and coronary angiography done today (5/16), showing normal coronary arteries and well compensated hemodynamics, per cardiology. However, cMRI from 5/15 shows possible concern for LAA thrombus, so cardiology considering TEE to rule out. Pharmacy now consulted to transition from heparin to Lovenox therapy to minimize fluid and blood draws.  07/23/2023: Heparin discontinued 5/16 @1230  by cardiology  CBC stable Scr 1.0; CrCl 67  ml/min  Goal of Therapy:  Monitor platelets by anticoagulation protocol: Yes   Plan:  - Start Lovenox 1mg /kg SQ q12h - Monitor CBC at least q72h - Monitor s/sx of bleeding daily  - TEE tentatively planned for Monday, per cardiology   Roselee Cong, PharmD Clinical Pharmacist  5/16/20251:33 PM

## 2023-07-23 NOTE — Progress Notes (Signed)
 PHARMACY - ANTICOAGULATION CONSULT NOTE  Pharmacy Consult for heparin Indication: possible LV thrombus  Allergies  Allergen Reactions   Metformin Diarrhea    Only a high dose    Patient Measurements: Height: 5\' 5"  (165.1 cm) Weight: 60.8 kg (134 lb 0.6 oz) IBW/kg (Calculated) : 57 HEPARIN DW (KG): 70.3  Vital Signs: Temp: 98.6 F (37 C) (05/16 0437) Temp Source: Oral (05/15 2006) BP: 124/76 (05/16 0437) Pulse Rate: 93 (05/16 0437)  Labs: Recent Labs    07/21/23 0402 07/21/23 1238 07/21/23 1827 07/22/23 0411 07/23/23 0345  HGB 11.3*  --   --  12.6 12.5  HCT 37.1  --   --  40.4 40.8  PLT 331  --   --  369 411*  HEPARINUNFRC 0.26*   < > 0.32 0.40 0.54  CREATININE 0.44  --   --  0.51 0.61   < > = values in this interval not displayed.    Estimated Creatinine Clearance: 67.3 mL/min (by C-G formula based on SCr of 0.61 mg/dL).   Medical History: Past Medical History:  Diagnosis Date   Diabetes mellitus without complication (HCC)    Hypertension     Medications:  5/12-5/13 Lovenox 40 mg - last administered 5/13 10:51  Assessment: 58 year old female presented with shortness of breath and cough, found to be hypoxic on room air. Treating for respiratory failure, multifactorial in setting of HF and pneumonia. ECHO revealed EF 25-30% and was also concerning for possible small LV apical thrombus. Pharmacy consulted for heparin.  07/23/2023: 03:45 Heparin level = 0.54; remains therapeutic with IV heparin infusing at 1150 units/hr Hgb/pltc WNL No bleeding or infusion related concerns reported by RN Heart cath and coronary angiography planned for today  Goal of Therapy:  Heparin level 0.3-0.7 units/ml Monitor platelets by anticoagulation protocol: Yes   Plan:  - Continue heparin infusion at 1150 units/hr - Monitor daily heparin level, CBC, signs/symptoms of bleeding - F/U antithrombotic therapy plans after heart cath    Delpha Fickle, PharmD, BCPS Clinical  Pharmacist 07/23/2023 7:47 AM

## 2023-07-23 NOTE — Progress Notes (Signed)
 Called [facility] to coordinate transport back to [facility] hospital.  Tenative time for return 1130.

## 2023-07-23 NOTE — Progress Notes (Signed)
 Patient Name: Natasha Hudson Date of Encounter: 07/23/2023 [facility] Cardiologist: Olinda Bertrand, DO (NEW)  Interval Summary  .    S/p heart cath  Daughters and sister at bedside Shortness of breath much better.  Reviewed cMRI and heart cath results.   Vital Signs .    Vitals:   07/23/23 1130 07/23/23 1140 07/23/23 1145 07/23/23 1225  BP:   128/86 133/82  Pulse: 94 92 92 88  Resp: 13 (!) 27 13 14   Temp:    98.2 F (36.8 C)  TempSrc:    Oral  SpO2: 97% 96% 96% 98%  Weight:      Height:        Intake/Output Summary (Last 24 hours) at 07/23/2023 1317 Last data filed at 07/23/2023 1000 Gross per 24 hour  Intake 120 ml  Output --  Net 120 ml      07/23/2023    5:00 AM 07/22/2023    7:08 AM 07/21/2023    4:29 AM  Last 3 Weights  Weight (lbs) 134 lb 0.6 oz 135 lb 5.8 oz 143 lb 8.3 oz  Weight (kg) 60.8 kg 61.4 kg 65.1 kg         Telemetry/ECG Normal sinus rhythm - Personally Reviewed  Echo  07/19/2023  1. Left ventricular ejection fraction, by estimation, is 25 to 30%. Left  ventricular ejection fraction by 2D MOD biplane is 27.2 %. The left  ventricle has severely decreased function. The left ventricle has no  regional wall motion abnormalities with  septal-lateral dyssynchrony consistent with LBBB and septal and inferior  wall severe hypokinesis. Indeterminate diastolic filling due to E-A  fusion. There is some concern for a small LV apical thrombus on Definity  images, I cannot find it on the  non-contrast enhanced images. Consider cardiac MRI to investigate further.   2. Right ventricular systolic function is normal. The right ventricular  size is normal. There is mildly elevated pulmonary artery systolic  pressure. The estimated right ventricular systolic pressure is 37.3 mmHg.   3. The mitral valve is abnormal. Moderate to severe mitral valve  regurgitation (severe visually, moderate by PISA ERO 0.21 cm^2). No  evidence of mitral stenosis. The  mean mitral valve gradient is 6.0 mmHg.  Though mean gradient is elevated, visually  valve opens well. Elevated gradient may be due to high flow with  significant mitral regurgitation.   4. The aortic valve is tricuspid. There is mild calcification of the  aortic valve. Aortic valve regurgitation is not visualized. No aortic  stenosis is present.   5. The inferior vena cava is normal in size with greater than 50%  respiratory variability, suggesting right atrial pressure of 3 mmHg.   6. There is a left pleural effusion. a small pericardial effusion is  present. The pericardial effusion is localized near the right atrium.   cMRI 07/22/2023 1. Severe decrease in left ventricular systolic function (LVEF =27%). No LV apical thrombus noted. Moderate dilation.   2. Normal right ventricular systolic function (RVEF =45%).   3. There is a 11 mm X 10 mm well circumscribed mass with attached to a central stalk to the pulmonary ridge. This is concerning for thrombus. Consider TEE evaluation.   4. Qualitatively, there is significant mitral regurgitation. Regurgitant fraction 42%. Suggestive of severe regurgitation.  Left and right heart cath 07/23/2023 1.  Angiographically normal coronary arteries (right dominant) 2.  Right heart catheterization data: Right atrial pressure mean 3 mmHg RV pressure 32 over 3  mmHg PA pressure 35/14 mean 20 mmHg Pulmonary capillary wedge pressure A-wave 14, V wave 15, mean 13 mmHg LVEDP 21 mmHg Cardiac output 5.2 L/min, cardiac index 3.1 L/min/m   Conclusion: Patient with normal coronary arteries and well compensated hemodynamics  Physical Exam .     General: Age appropriate, hemodynamically stable, no acute distress HEENT: Moist membranes, trachea midline, JVP(improving) Lungs: Clear to auscultation with rales at the bases, no rhonchi's or wheezing Heart: regular, positive S1-S2, holosystolic murmur heard at the apex no gallops or rubs Abdomen: Soft,  nontender, distended, positive bowel sounds all 4 quadrants Extremities: warm to touch, 1+ pitting edema bilaterally Neuro: Alert x 4, nonfocal Psych: Cooperative and pleasant  Assessment & Plan .   EKG: (personally reviewed by me) No new tracing  Telemetry: (personally reviewed by me) Sinus rhythm   Impression:  Newly discovered heart failure with reduced EF Nonischemic Cardiomyopathy Questionable LV thrombus - Not seen on cMRI.  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  Change Lasix 40mg  IVP to 20mg  IVP daily.  Cont. Jardiance  Start Losartan 25mg  po qPM Start Aldactone 12.5mg  po QAM  Primary to look into medication cost coverage for Entresto, Jardiance, etc.  Resp panel positive for Rhinovirus  Will need echo in 3 months to re-evaluate LVEF    Questionable LV thrombus: Noted on 2D echo which lead to cMRI - that confirms no LV thrombus.  However, cmRI notes findings concerning for either LAA thrombus vs. Prominent coumadin ridge.  Discussed TEE to confirm prior to subjecting the patient to anticoagulation. Patient and daughters in agreement. Consent is not obtained as she has received sedative meds as part of her cath today. Consent will need to be obtained in 24hrs. Family aware the TEE will happen on Monday. I did discuss the procedure w/ them today.  Will transition her off IV heparin gtt and place on Lovenox until we have TEE results.    Poorly controlled diabetes: A1c greater than 15 Management per primary team   Hypertension: Blood pressures are well-controlled. Currently being diuresed and see above.    Hyperlipidemia: Most recent LDL 96 mg/dL. Given the fact that she is a diabetic would recommend a goal LDL <70  mg/dL.  Started Crestor 20 mg p.o. nightly 07/21/2023 recommend 6-week follow-up fasting lipids and CMP to evaluate LFTs.  Results and care plan reviewed w/ patient and both daughter today. I also spoke to Dr. Wendall Halls regarding her care.   Further recommendations to follow as the case evolves.   This note was created using a voice recognition software as a result there may be grammatical errors inadvertently enclosed that do not reflect the nature of this encounter. Every attempt is made to correct such errors.   Coley Kulikowski Fredrich Jefferson, [facility]  [facility]  Pager: [phone] Office: [phone] 07/23/2023 1:17 PM

## 2023-07-24 DIAGNOSIS — I509 Heart failure, unspecified: Secondary | ICD-10-CM | POA: Diagnosis not present

## 2023-07-24 DIAGNOSIS — J189 Pneumonia, unspecified organism: Secondary | ICD-10-CM | POA: Diagnosis not present

## 2023-07-24 LAB — CULTURE, BLOOD (ROUTINE X 2)
Culture: NO GROWTH
Culture: NO GROWTH
Culture: NO GROWTH

## 2023-07-24 LAB — CULTURE, RESPIRATORY W GRAM STAIN: Culture: NORMAL

## 2023-07-24 LAB — BASIC METABOLIC PANEL WITH GFR
Anion gap: 8 (ref 5–15)
BUN: 14 mg/dL (ref 6–20)
CO2: 26 mmol/L (ref 22–32)
Calcium: 8.6 mg/dL — ABNORMAL LOW (ref 8.9–10.3)
Chloride: 105 mmol/L (ref 98–111)
Creatinine, Ser: 0.63 mg/dL (ref 0.44–1.00)
GFR, Estimated: >60 mL/min (ref >60)
Glucose, Bld: 102 mg/dL — ABNORMAL HIGH (ref 70–99)
Potassium: 3.7 mmol/L (ref 3.5–5.1)
Sodium: 139 mmol/L (ref 135–145)

## 2023-07-24 LAB — GLUCOSE, CAPILLARY
Glucose-Capillary: 120 mg/dL — ABNORMAL HIGH (ref 70–99)
Glucose-Capillary: 184 mg/dL — ABNORMAL HIGH (ref 70–99)
Glucose-Capillary: 121 mg/dL — ABNORMAL HIGH (ref 70–99)
Glucose-Capillary: 184 mg/dL — ABNORMAL HIGH (ref 70–99)

## 2023-07-24 LAB — MAGNESIUM: Magnesium: 2 mg/dL (ref 1.7–2.4)

## 2023-07-24 NOTE — Progress Notes (Signed)
 Patient Name: Natasha Hudson Date of Encounter: 07/24/2023 [facility] Cardiologist: Olinda Bertrand, DO (NEW)  Interval Summary  .    Pt reports she feels great. Shortness of breath is much improved.   Vital Signs .    Vitals:   07/23/23 2027 07/24/23 0250 07/24/23 0456 07/24/23 1238  BP: 110/73 106/70  121/65  Pulse: 95 89  88  Resp: 19 18    Temp: 98.1 F (36.7 C) 98.2 F (36.8 C)  98.5 F (36.9 C)  TempSrc: Oral Oral  Oral  SpO2: 97% 98%  100%  Weight:   60.2 kg   Height:        Intake/Output Summary (Last 24 hours) at 07/24/2023 1309 Last data filed at 07/24/2023 0838 Gross per 24 hour  Intake 993 ml  Output 700 ml  Net 293 ml      07/24/2023    4:56 AM 07/23/2023    5:00 AM 07/22/2023    7:08 AM  Last 3 Weights  Weight (lbs) 132 lb 11.5 oz 134 lb 0.6 oz 135 lb 5.8 oz  Weight (kg) 60.2 kg 60.8 kg 61.4 kg         Telemetry/ECG Normal sinus rhythm - Personally Reviewed  Echo  07/19/2023  1. Left ventricular ejection fraction, by estimation, is 25 to 30%. Left  ventricular ejection fraction by 2D MOD biplane is 27.2 %. The left  ventricle has severely decreased function. The left ventricle has no  regional wall motion abnormalities with  septal-lateral dyssynchrony consistent with LBBB and septal and inferior  wall severe hypokinesis. Indeterminate diastolic filling due to E-A  fusion. There is some concern for a small LV apical thrombus on Definity   images, I cannot find it on the  non-contrast enhanced images. Consider cardiac MRI to investigate further.   2. Right ventricular systolic function is normal. The right ventricular  size is normal. There is mildly elevated pulmonary artery systolic  pressure. The estimated right ventricular systolic pressure is 37.3 mmHg.   3. The mitral valve is abnormal. Moderate to severe mitral valve  regurgitation (severe visually, moderate by PISA ERO 0.21 cm^2). No  evidence of mitral stenosis. The mean mitral  valve gradient is 6.0 mmHg.  Though mean gradient is elevated, visually  valve opens well. Elevated gradient may be due to high flow with  significant mitral regurgitation.   4. The aortic valve is tricuspid. There is mild calcification of the  aortic valve. Aortic valve regurgitation is not visualized. No aortic  stenosis is present.   5. The inferior vena cava is normal in size with greater than 50%  respiratory variability, suggesting right atrial pressure of 3 mmHg.   6. There is a left pleural effusion. a small pericardial effusion is  present. The pericardial effusion is localized near the right atrium.   cMRI 07/22/2023 1. Severe decrease in left ventricular systolic function (LVEF =27%). No LV apical thrombus noted. Moderate dilation.   2. Normal right ventricular systolic function (RVEF =45%).   3. There is a 11 mm X 10 mm well circumscribed mass with attached to a central stalk to the pulmonary ridge. This is concerning for thrombus. Consider TEE evaluation.   4. Qualitatively, there is significant mitral regurgitation. Regurgitant fraction 42%. Suggestive of severe regurgitation.  Left and right heart cath 07/23/2023 1.  Angiographically normal coronary arteries (right dominant) 2.  Right heart catheterization data: Right atrial pressure mean 3 mmHg RV pressure 32 over 3 mmHg PA pressure  35/14 mean 20 mmHg Pulmonary capillary wedge pressure A-wave 14, V wave 15, mean 13 mmHg LVEDP 21 mmHg Cardiac output 5.2 L/min, cardiac index 3.1 L/min/m   Conclusion: Patient with normal coronary arteries and well compensated hemodynamics  Physical Exam .     General: Age appropriate, hemodynamically stable, no acute distress HEENT: Moist membranes, trachea midline, JVP(improving) Lungs: Clear to auscultation with rales at the bases, no rhonchi's or wheezing Heart: regular, positive S1-S2, holosystolic murmur heard at the apex no gallops or rubs Abdomen: Soft, nontender,  distended, positive bowel sounds all 4 quadrants Extremities: warm to touch, 1+ pitting edema bilaterally Neuro: Alert x 4, nonfocal Psych: Cooperative and pleasant  Assessment & Plan .   EKG: (personally reviewed by me) 07/18/2023 -sinus rhythm with partial left bundle branch block  Telemetry: (personally reviewed by me) Sinus rhythm   Impression:  Newly discovered heart failure with reduced EF Nonischemic Cardiomyopathy Questionable LV thrombus - Not seen on cMRI.  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  Continue Lasix  20mg  IVP daily.  Cont. Jardiance   cont Losartan  25mg  po qPM Cont Aldactone  12.5mg  po QAM  Primary to look into medication cost coverage for Entresto, Jardiance , etc.  Resp panel positive for Rhinovirus  Will need echo in 3 months to re-evaluate LVEF    Questionable LV thrombus: Noted on 2D echo which lead to cMRI - that confirms no LV thrombus.  However, cmRI notes findings of a mass attached to the pulmonary ridge though atrial thrombus would be unusual in the absence of atrial arrhythmia.  TEE has been arranged for Monday.  Continue Lovenox  for now.   Poorly controlled diabetes: A1c greater than 15 Management per primary team   Hypertension: Blood pressures are well-controlled. Currently being diuresed and see above.    Hyperlipidemia: Most recent LDL 96 mg/dL. Given the fact that she is a diabetic would recommend a goal LDL <70 mg/dL.  Started Crestor  20 mg p.o. nightly 07/21/2023 recommend 6-week follow-up fasting lipids and CMP to evaluate LFTs.     Efraim Grange, MD [facility]  Pager: [phone] Office: [phone] 07/24/2023 1:09 PM

## 2023-07-24 NOTE — TOC Initial Note (Signed)
 Transition of Care [facility]) - Initial/Assessment Note    Patient Details  Name: Natasha Hudson MRN: [id number] Date of Birth: 08-12-1965  [facility] CM/SW Contact:    Amaryllis Junior, LCSW Phone Number: 07/24/2023, 1:44 PM  Clinical Narrative:                 Pt from home alone. Pt continues medical workup. TOC following for dc needs.     Barriers to Discharge: Continued Medical Work up   Patient Goals and CMS Choice Patient states their goals for this hospitalization and ongoing recovery are:: return home CMS Medicare.gov Compare Post Acute Care list provided to::  (NA) Choice offered to / list presented to : NA [place] ownership interest in [facility].provided to::  (NA)    Expected Discharge Plan and Services In-house Referral: NA Discharge Planning Services: NA   Living arrangements for the past 2 months: Single Family Home                 DME Arranged: N/A DME Agency: NA       HH Arranged: NA HH Agency: NA        Prior Living Arrangements/Services Living arrangements for the past 2 months: Single Family Home Lives with:: Self Patient language and need for interpreter reviewed:: Yes Do you feel safe going back to the place where you live?: Yes      Need for Family Participation in Patient Care: Yes (Comment) Care giver support system in place?: Yes (comment)   Criminal Activity/Legal Involvement Pertinent to Current Situation/Hospitalization: No - Comment as needed  Activities of Daily Living   ADL Screening (condition at time of admission) Independently performs ADLs?: Yes (appropriate for developmental age) Is the patient deaf or have difficulty hearing?: No Does the patient have difficulty seeing, even when wearing glasses/contacts?: No Does the patient have difficulty concentrating, remembering, or making decisions?: No  Permission Sought/Granted                  Emotional Assessment Appearance:: Appears stated  age Attitude/Demeanor/Rapport: Engaged Affect (typically observed): Accepting Orientation: : Oriented to Self, Oriented to Place, Oriented to  Time, Oriented to Situation Alcohol / Substance Use: Not Applicable Psych Involvement: No (comment)  Admission diagnosis:  Pneumonia [J18.9] Acute systolic congestive heart failure (HCC) [I50.21] Acute hypoxic respiratory failure (HCC) [J96.01] Acute hypoxemic respiratory failure (HCC) [J96.01] Patient Active Problem List   Diagnosis Date Noted   Rhinovirus 07/23/2023   Nonischemic cardiomyopathy (HCC) 07/23/2023   Systolic CHF, acute (HCC) 07/21/2023   Cardiomyopathy (HCC) 07/21/2023   LBBB (left bundle branch block) 07/21/2023   Mixed hyperlipidemia 07/21/2023   Acute HFrEF (heart failure with reduced ejection fraction) (HCC) 07/21/2023   Acute hypoxemic respiratory failure (HCC) 07/20/2023   Multifocal pneumonia 07/19/2023   Acute hypoxic respiratory failure (HCC) 07/19/2023   Non-insulin  dependent type 2 diabetes mellitus (HCC) 07/19/2023   Essential hypertension 07/19/2023   PCP:  Pcp, No Pharmacy:   [id number] - [place], [place] - [address] [place] [address] Phone: [phone] Fax: [phone]     Social Drivers of Health (SDOH) Social History: SDOH Screenings   Food Insecurity: No Food Insecurity (07/19/2023)  Housing: Low Risk  (07/19/2023)  Transportation Needs: No Transportation Needs (07/19/2023)  Utilities: Not At Risk (07/19/2023)  Social Connections: Unknown (07/19/2023)  Tobacco Use: Low Risk  (07/19/2023)   SDOH Interventions:  Readmission Risk Interventions    07/24/2023    1:42 PM  Readmission Risk Prevention Plan  Post Dischage Appt Complete  Medication Screening Complete  Transportation Screening Complete

## 2023-07-24 NOTE — Progress Notes (Signed)
 PROGRESS NOTE    Natasha Hudson  ZOX:[id number] DOB: 10/12/1965 DOA: 07/18/2023 PCP: Vicente Graham, No     Brief Narrative:  Natasha Hudson is a 58 year old female with diabetes mellitus and hypertension who presents to the hospital for shortness of breath, productive cough and is found to be hypoxic with a pulse ox of 86% on room air, tachypneic and tachycardic.  CT scan of the chest: Mild multifocal infiltrates in the upper lobe, large bilateral effusions and significant atelectasis.  Started on treatment for pneumonia and heart failure with ceftriaxone , doxycycline  and furosemide .  Also found to have an EF of 25 to 30%, and A1c of 15.4.  Insulin  teaching and cardiology consult requested.  Patient underwent cardiac MRI as well as heart cath.  Currently awaiting TEE.  New events last 24 hours / Subjective: No new complaints.  Awaiting TEE which is scheduled for Monday  Assessment & Plan:   Principal Problem:   Multifocal pneumonia Active Problems:   Acute hypoxic respiratory failure (HCC)   Non-insulin  dependent type 2 diabetes mellitus (HCC)   Essential hypertension   Acute hypoxemic respiratory failure (HCC)   Systolic CHF, acute (HCC)   Cardiomyopathy (HCC)   LBBB (left bundle branch block)   Mixed hyperlipidemia   Acute HFrEF (heart failure with reduced ejection fraction) (HCC)   Rhinovirus   Nonischemic cardiomyopathy (HCC)   Multifocal pneumonia - Flu, RSV, Covid negative  - Procalcitonin low  - Respiratory viral panel +Rhinovirus  - Continue supportive care   Acute systolic HF Nonischemic cardiomyopathy  - EF 25-30%  - Cardiology following - IV lasix , Jardiance , Losartan , Aldactone  - TOC consult for med assistance  - Heart cath with normal coronary arteries   Question LV thrombus - Cardiac MRI "11 mm X 10 mm well circumscribed mass with attached to a central stalk to the pulmonary ridge. This is concerning for thrombus. Consider TEE evaluation." - Lovenox . TEE planned  Monday   DM with hyperglycemia - Ha1c 15.4  - Semglee , NovoLog , SSI     DVT prophylaxis: Lovenox   SCDs Start: 07/19/23 0510 Place TED hose Start: 07/19/23 0510  Code Status: Full Family Communication: None at bedside  Disposition Plan: Home Status is: Inpatient Remains inpatient appropriate because: TEE Monday    Antimicrobials:  Anti-infectives (From admission, onward)    Start     Dose/Rate Route Frequency Ordered Stop   07/20/23 0100  cefTRIAXone  (ROCEPHIN ) 2 g in sodium chloride  0.9 % 100 mL IVPB  Status:  Discontinued        2 g 200 mL/hr over 30 Minutes Intravenous Every 24 hours 07/19/23 0509 07/20/23 1215   07/19/23 1300  doxycycline  (VIBRAMYCIN ) 100 mg in sodium chloride  0.9 % 250 mL IVPB  Status:  Discontinued        100 mg 125 mL/hr over 120 Minutes Intravenous Every 12 hours 07/19/23 0509 07/20/23 1215   07/19/23 0100  cefTRIAXone  (ROCEPHIN ) 1 g in sodium chloride  0.9 % 100 mL IVPB        1 g 200 mL/hr over 30 Minutes Intravenous  Once 07/19/23 0056 07/19/23 0157   07/19/23 0100  doxycycline  (VIBRA -TABS) tablet 100 mg        100 mg Oral  Once 07/19/23 0056 07/19/23 0122        Objective: Vitals:   07/23/23 2027 07/24/23 0250 07/24/23 0456 07/24/23 1238  BP: 110/73 106/70  121/65  Pulse: 95 89  88  Resp: 19 18    Temp: 98.1 F (36.7 C) 98.2  F (36.8 C)  98.5 F (36.9 C)  TempSrc: Oral Oral  Oral  SpO2: 97% 98%  100%  Weight:   60.2 kg   Height:        Intake/Output Summary (Last 24 hours) at 07/24/2023 1329 Last data filed at 07/24/2023 0865 Gross per 24 hour  Intake 993 ml  Output 700 ml  Net 293 ml   Filed Weights   07/22/23 0708 07/23/23 0500 07/24/23 0456  Weight: 61.4 kg 60.8 kg 60.2 kg    Examination:  General exam: Appears calm and comfortable    Data Reviewed: I have personally reviewed following labs and imaging studies  CBC: Recent Labs  Lab 07/18/23 2231 07/18/23 2237 07/20/23 0403 07/21/23 0402 07/22/23 0411  07/23/23 0345 07/23/23 0947 07/23/23 0948 07/23/23 1300  WBC 11.9*   < > 7.3 6.8 5.2 6.7  --   --  6.2  NEUTROABS 9.9*  --   --   --   --   --   --   --   --   HGB 12.9   < > 11.8* 11.3* 12.6 12.5 12.2 12.6 13.7  HCT 40.0   < > 37.5 37.1 40.4 40.8 36.0 37.0 44.2  MCV 84.2   < > 86.8 89.0 86.9 88.3  --   --  88.2  PLT 406*   < > 322 331 369 411*  --   --  464*   < > = values in this interval not displayed.   Basic Metabolic Panel: Recent Labs  Lab 07/20/23 0403 07/20/23 0703 07/20/23 1606 07/21/23 0402 07/22/23 0411 07/23/23 0340 07/23/23 0345 07/23/23 0947 07/23/23 0948 07/24/23 0449  NA 135  --   --  139 141  --  138 141 141 139  K 2.8*  --    < > 3.4* 3.3*  --  3.9 3.8 3.8 3.7  CL 98  --   --  105 105  --  102  --   --  105  CO2 27  --   --  24 26  --  26  --   --  26  GLUCOSE 181*  --   --  92 93  --  115*  --   --  102*  BUN 8  --   --  11 17  --  16  --   --  14  CREATININE 0.44  --   --  0.44 0.51  --  0.61  --   --  0.63  CALCIUM  8.3*  --   --  8.4* 8.7*  --  8.1*  --   --  8.6*  MG  --  1.7  --   --  1.8 2.3  --   --   --  2.0   < > = values in this interval not displayed.   GFR: Estimated Creatinine Clearance: 67.3 mL/min (by C-G formula based on SCr of 0.63 mg/dL). Liver Function Tests: Recent Labs  Lab 07/18/23 2222 07/20/23 0403  AST 37 16  ALT 71* 37  ALKPHOS 83 49  BILITOT 0.5 0.9  PROT 6.6 6.0*  ALBUMIN 3.5 2.6*   No results for input(s): "LIPASE", "AMYLASE" in the last 168 hours. No results for input(s): "AMMONIA" in the last 168 hours. Coagulation Profile: No results for input(s): "INR", "PROTIME" in the last 168 hours. Cardiac Enzymes: No results for input(s): "CKTOTAL", "CKMB", "CKMBINDEX", "TROPONINI" in the last 168 hours. BNP (last 3 results) Recent Labs  07/18/23 2222  PROBNP 2,515.0*   HbA1C: No results for input(s): "HGBA1C" in the last 72 hours.  CBG: Recent Labs  Lab 07/23/23 1231 07/23/23 1718 07/23/23 2026  07/24/23 0738 07/24/23 1137  GLUCAP 89 199* 164* 120* 184*   Lipid Profile: No results for input(s): "CHOL", "HDL", "LDLCALC", "TRIG", "CHOLHDL", "LDLDIRECT" in the last 72 hours.  Thyroid Function Tests: No results for input(s): "TSH", "T4TOTAL", "FREET4", "T3FREE", "THYROIDAB" in the last 72 hours. Anemia Panel: No results for input(s): "VITAMINB12", "FOLATE", "FERRITIN", "TIBC", "IRON", "RETICCTPCT" in the last 72 hours. Sepsis Labs: Recent Labs  Lab 07/18/23 2222 07/19/23 0640  PROCALCITON  --  <0.10  LATICACIDVEN 1.6  --     Recent Results (from the past 240 hours)  Resp panel by RT-PCR (RSV, Flu A&B, Covid) Anterior Nasal Swab     Status: None   Collection Time: 07/17/23  8:30 AM   Specimen: Anterior Nasal Swab  Result Value Ref Range Status   SARS Coronavirus 2 by RT PCR NEGATIVE NEGATIVE Final    Comment: (NOTE) SARS-CoV-2 target nucleic acids are NOT DETECTED.  The SARS-CoV-2 RNA is generally detectable in upper respiratory specimens during the acute phase of infection. The lowest concentration of SARS-CoV-2 viral copies this assay can detect is 138 copies/mL. A negative result does not preclude SARS-Cov-2 infection and should not be used as the sole basis for treatment or other patient management decisions. A negative result may occur with  improper specimen collection/handling, submission of specimen other than nasopharyngeal swab, presence of viral mutation(s) within the areas targeted by this assay, and inadequate number of viral copies(<138 copies/mL). A negative result must be combined with clinical observations, patient history, and epidemiological information. The expected result is Negative.  Fact Sheet for Patients:  BloggerCourse.com  Fact Sheet for Healthcare Providers:  SeriousBroker.it  This test is no t yet approved or cleared by the [place]  FDA and  has been authorized for detection  and/or diagnosis of SARS-CoV-2 by FDA under an Emergency Use Authorization (EUA). This EUA will remain  in effect (meaning this test can be used) for the duration of the COVID-19 declaration under Section 564(b)(1) of the Act, 21 U.S.C.section 360bbb-3(b)(1), unless the authorization is terminated  or revoked sooner.       Influenza A by PCR NEGATIVE NEGATIVE Final   Influenza B by PCR NEGATIVE NEGATIVE Final    Comment: (NOTE) The Xpert Xpress SARS-CoV-2/FLU/RSV plus assay is intended as an aid in the diagnosis of influenza from Nasopharyngeal swab specimens and should not be used as a sole basis for treatment. Nasal washings and aspirates are unacceptable for Xpert Xpress SARS-CoV-2/FLU/RSV testing.  Fact Sheet for Patients: BloggerCourse.com  Fact Sheet for Healthcare Providers: SeriousBroker.it  This test is not yet approved or cleared by the [place]  FDA and has been authorized for detection and/or diagnosis of SARS-CoV-2 by FDA under an Emergency Use Authorization (EUA). This EUA will remain in effect (meaning this test can be used) for the duration of the COVID-19 declaration under Section 564(b)(1) of the Act, 21 U.S.C. section 360bbb-3(b)(1), unless the authorization is terminated or revoked.     Resp Syncytial Virus by PCR NEGATIVE NEGATIVE Final    Comment: (NOTE) Fact Sheet for Patients: BloggerCourse.com  Fact Sheet for Healthcare Providers: SeriousBroker.it  This test is not yet approved or cleared by the [place]  FDA and has been authorized for detection and/or diagnosis of SARS-CoV-2 by FDA under an Emergency Use Authorization (EUA). This EUA will  remain in effect (meaning this test can be used) for the duration of the COVID-19 declaration under Section 564(b)(1) of the Act, 21 U.S.C. section 360bbb-3(b)(1), unless the authorization is terminated  or revoked.  Performed at [facility], [address] [place], [place] [address]   Culture, blood (routine x 2)     Status: None   Collection Time: 07/18/23 10:09 PM   Specimen: BLOOD  Result Value Ref Range Status   Specimen Description   Final    BLOOD RIGHT ANTECUBITAL Performed at [facility], [address] [place], [place] [address]    Special Requests   Final    BOTTLES DRAWN AEROBIC AND ANAEROBIC Blood Culture results may not be optimal due to an inadequate volume of blood received in culture bottles Performed at [facility], [address] [place], [place] [address]    Culture   Final    NO GROWTH 5 DAYS Performed at [facility], [address] [place], [place] [address]    Report Status 07/24/2023 FINAL  Final  Culture, blood (routine x 2) Call MD if unable to obtain prior to antibiotics being given     Status: None   Collection Time: 07/19/23  6:40 AM   Specimen: BLOOD  Result Value Ref Range Status   Specimen Description   Final    BLOOD BLOOD RIGHT ARM AEROBIC BOTTLE ONLY ANAEROBIC BOTTLE ONLY Performed at [facility], [address], [place], [place] [address]    Special Requests   Final    BOTTLES DRAWN AEROBIC AND ANAEROBIC Blood Culture results may not be optimal due to an inadequate volume of blood received in culture bottles Performed at [facility], [address], [place], [place] [address]    Culture   Final    NO GROWTH 5 DAYS Performed at [facility], [address], [place], [place] [address]    Report Status 07/24/2023 FINAL  Final  Culture, blood (routine x 2) Call MD if unable to obtain prior to antibiotics being given     Status: None   Collection Time: 07/19/23  6:44 AM   Specimen: BLOOD  Result Value Ref Range Status   Specimen Description   Final    BLOOD BLOOD LEFT ARM AEROBIC BOTTLE ONLY ANAEROBIC BOTTLE ONLY Performed at [facility], [address] [place], [place] [address]    Special Requests   Final    BOTTLES DRAWN AEROBIC AND ANAEROBIC Blood Culture results may not be optimal due to an inadequate volume of blood received in culture bottles Performed at [facility], [address] [place], [place] [address]    Culture   Final    NO GROWTH 5 DAYS Performed at [facility], [address], [place], [place] [address]    Report Status 07/24/2023 FINAL  Final  Respiratory (~20 pathogens) panel by PCR     Status: Abnormal   Collection Time: 07/22/23  1:37 PM   Specimen: Nasopharyngeal Swab; Respiratory  Result Value Ref Range Status   Adenovirus NOT DETECTED NOT DETECTED Final   Coronavirus 229E NOT DETECTED NOT DETECTED Final    Comment: (NOTE) The Coronavirus on the Respiratory Panel, DOES NOT test for the novel  Coronavirus (2019 nCoV)    Coronavirus HKU1 NOT DETECTED NOT DETECTED Final   Coronavirus NL63 NOT DETECTED NOT DETECTED Final   Coronavirus OC43 NOT  DETECTED NOT DETECTED Final   Metapneumovirus NOT DETECTED NOT DETECTED Final   Rhinovirus / Enterovirus DETECTED (A) NOT DETECTED Final   Influenza A NOT DETECTED NOT DETECTED Final   Influenza B NOT DETECTED NOT DETECTED Final   Parainfluenza Virus 1 NOT DETECTED NOT DETECTED Final   Parainfluenza Virus 2 NOT DETECTED NOT DETECTED Final   Parainfluenza Virus 3 NOT DETECTED NOT DETECTED Final   Parainfluenza Virus 4 NOT DETECTED NOT DETECTED Final   Respiratory Syncytial Virus NOT DETECTED NOT DETECTED Final   Bordetella pertussis NOT DETECTED NOT DETECTED Final   Bordetella Parapertussis NOT DETECTED NOT DETECTED Final   Chlamydophila pneumoniae NOT DETECTED NOT DETECTED Final   Mycoplasma pneumoniae NOT DETECTED NOT DETECTED Final    Comment: Performed at [facility], [address] [place], [place] [address]  Expectorated Sputum Assessment w Gram Stain, Rflx to Resp Cult     Status: None   Collection  Time: 07/22/23  2:37 PM   Specimen: Expectorated Sputum  Result Value Ref Range Status   Specimen Description EXPECTORATED SPUTUM  Final   Special Requests NONE  Final   Sputum evaluation   Final    THIS SPECIMEN IS ACCEPTABLE FOR SPUTUM CULTURE Performed at [facility], [address], [place], [place] [address]    Report Status 07/22/2023 FINAL  Final  Culture, Respiratory w Gram Stain     Status: None   Collection Time: 07/22/23  2:37 PM  Result Value Ref Range Status   Specimen Description   Final    EXPECTORATED SPUTUM Performed at [facility], [address] [place], [place] [address]    Special Requests   Final    NONE Reflexed from [id number] Performed at [facility], [address] [place], [place] [address]    Gram Stain   Final    FEW WBC PRESENT, PREDOMINANTLY PMN FEW BUDDING YEAST SEEN RARE GRAM POSITIVE COCCI IN CHAINS    Culture   Final    MODERATE Normal respiratory flora-no Staph aureus or Pseudomonas seen Performed at [facility], [address], [place], [place] [address]    Report Status 07/24/2023 FINAL  Final      Radiology Studies: CARDIAC CATHETERIZATION Result Date: 07/23/2023 1.  Angiographically normal coronary arteries (right dominant) 2.  Right heart catheterization data: Right atrial pressure mean 3 mmHg RV pressure 32 over 3 mmHg PA pressure 35/14 mean 20 mmHg Pulmonary capillary wedge pressure A-wave 14, V wave 15, mean 13 mmHg LVEDP 21 mmHg Cardiac output 5.2 L/min, cardiac index 3.1 L/min/m Conclusion: Patient with normal coronary arteries and well compensated hemodynamics       Scheduled Meds:  empagliflozin   10 mg Oral Daily   enoxaparin  (LOVENOX ) injection  60 mg Subcutaneous Q12H   furosemide   20 mg Intravenous Daily   insulin  aspart  0-5 Units Subcutaneous QHS   insulin  aspart  0-6 Units Subcutaneous TID WC   insulin  aspart  3 Units Subcutaneous TID WC   insulin   glargine-yfgn  15 Units Subcutaneous Daily   living well with diabetes book   Does not apply Once   losartan   25 mg Oral Q2200   rosuvastatin   20 mg Oral Daily   sodium chloride  flush  3 mL Intravenous Q12H   sodium chloride  flush  3 mL Intravenous Q12H   sodium chloride  flush  3 mL Intravenous Q12H   spironolactone   12.5 mg Oral q morning   Continuous  Infusions:     LOS: 4 days   Time spent: 20 minutes   Daren Eck, DO [facility] 07/24/2023, 1:29 PM   Available via Epic secure chat 7am-7pm After these hours, please refer to coverage provider listed on [email or web address]

## 2023-07-25 ENCOUNTER — Encounter (HOSPITAL_COMMUNITY): Payer: Self-pay | Admitting: Cardiovascular Disease

## 2023-07-25 DIAGNOSIS — J189 Pneumonia, unspecified organism: Secondary | ICD-10-CM | POA: Diagnosis not present

## 2023-07-25 LAB — GLUCOSE, CAPILLARY
Glucose-Capillary: 100 mg/dL — ABNORMAL HIGH (ref 70–99)
Glucose-Capillary: 166 mg/dL — ABNORMAL HIGH (ref 70–99)
Glucose-Capillary: 99 mg/dL (ref 70–99)
Glucose-Capillary: 79 mg/dL (ref 70–99)
Glucose-Capillary: 264 mg/dL — ABNORMAL HIGH (ref 70–99)

## 2023-07-25 NOTE — Plan of Care (Signed)
  Problem: Clinical Measurements: Goal: Ability to maintain clinical measurements within normal limits will improve Outcome: Progressing Goal: Respiratory complications will improve Outcome: Progressing   Problem: Safety: Goal: Ability to remain free from injury will improve Outcome: Progressing   Problem: Activity: Goal: Ability to tolerate increased activity will improve Outcome: Progressing

## 2023-07-25 NOTE — Progress Notes (Signed)
 Patient Name: Natasha Hudson Date of Encounter: 07/25/2023  [facility] Cardiologist: Olinda Bertrand, DO (NEW)  Interval Summary  .    Pt reports she feels ok. Shortness of breath is much improved. Vision is a little blurry this AM and she had a slight nose bleed.  Vital Signs .    Vitals:   07/24/23 1956 07/25/23 0426 07/25/23 0656 07/25/23 0657  BP: 113/66 96/60 (!) 138/104 (!) 146/96  Pulse: 92 89 (!) 101 94  Resp:      Temp: 98.1 F (36.7 C) 98.1 F (36.7 C) 98.4 F (36.9 C)   TempSrc: Oral Oral Oral   SpO2: 98% 96% 100%   Weight:  60.2 kg    Height:        Intake/Output Summary (Last 24 hours) at 07/25/2023 0846 Last data filed at 07/24/2023 1800 Gross per 24 hour  Intake 360 ml  Output --  Net 360 ml      07/25/2023    4:26 AM 07/24/2023    4:56 AM 07/23/2023    5:00 AM  Last 3 Weights  Weight (lbs) 132 lb 11.2 oz 132 lb 11.5 oz 134 lb 0.6 oz  Weight (kg) 60.192 kg 60.2 kg 60.8 kg         Telemetry/ECG Normal sinus rhythm - Personally Reviewed  Echo  07/19/2023  1. Left ventricular ejection fraction, by estimation, is 25 to 30%. Left  ventricular ejection fraction by 2D MOD biplane is 27.2 %. The left  ventricle has severely decreased function. The left ventricle has no  regional wall motion abnormalities with  septal-lateral dyssynchrony consistent with LBBB and septal and inferior  wall severe hypokinesis. Indeterminate diastolic filling due to E-A  fusion. There is some concern for a small LV apical thrombus on Definity   images, I cannot find it on the  non-contrast enhanced images. Consider cardiac MRI to investigate further.   2. Right ventricular systolic function is normal. The right ventricular  size is normal. There is mildly elevated pulmonary artery systolic  pressure. The estimated right ventricular systolic pressure is 37.3 mmHg.   3. The mitral valve is abnormal. Moderate to severe mitral valve  regurgitation (severe visually,  moderate by PISA ERO 0.21 cm^2). No  evidence of mitral stenosis. The mean mitral valve gradient is 6.0 mmHg.  Though mean gradient is elevated, visually  valve opens well. Elevated gradient may be due to high flow with  significant mitral regurgitation.   4. The aortic valve is tricuspid. There is mild calcification of the  aortic valve. Aortic valve regurgitation is not visualized. No aortic  stenosis is present.   5. The inferior vena cava is normal in size with greater than 50%  respiratory variability, suggesting right atrial pressure of 3 mmHg.   6. There is a left pleural effusion. a small pericardial effusion is  present. The pericardial effusion is localized near the right atrium.   cMRI 07/22/2023 1. Severe decrease in left ventricular systolic function (LVEF =27%). No LV apical thrombus noted. Moderate dilation.   2. Normal right ventricular systolic function (RVEF =45%).   3. There is a 11 mm X 10 mm well circumscribed mass with attached to a central stalk to the pulmonary ridge. This is concerning for thrombus. Consider TEE evaluation.   4. Qualitatively, there is significant mitral regurgitation. Regurgitant fraction 42%. Suggestive of severe regurgitation.  Left and right heart cath 07/23/2023 1.  Angiographically normal coronary arteries (right dominant) 2.  Right heart catheterization  data: Right atrial pressure mean 3 mmHg RV pressure 32 over 3 mmHg PA pressure 35/14 mean 20 mmHg Pulmonary capillary wedge pressure A-wave 14, V wave 15, mean 13 mmHg LVEDP 21 mmHg Cardiac output 5.2 L/min, cardiac index 3.1 L/min/m   Conclusion: Patient with normal coronary arteries and well compensated hemodynamics  Physical Exam .     General: Age appropriate, hemodynamically stable, no acute distress HEENT: Moist membranes, trachea midline, JVP(improving) Lungs: Clear to auscultation with rales at the bases, no rhonchi's or wheezing Heart: regular, positive S1-S2,  holosystolic murmur heard at the apex no gallops or rubs Abdomen: Soft, nontender, distended, positive bowel sounds all 4 quadrants Extremities: warm to touch, 1+ pitting edema bilaterally Neuro: Alert x 4, nonfocal Psych: Cooperative and pleasant  Assessment & Plan .   EKG: (personally reviewed by me) 07/18/2023 -sinus rhythm with partial left bundle branch block  Telemetry: (personally reviewed by me) Sinus rhythm   Impression:  Newly discovered heart failure with reduced EF Nonischemic Cardiomyopathy Questionable LV thrombus - Not seen on cMRI.  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  Continue Lasix  20mg  IVP daily.  Cont. Jardiance   cont Losartan  25mg  po qPM Cont Aldactone  12.5mg  po QAM  Primary to look into medication cost coverage for Entresto, Jardiance , etc.  Resp panel positive for Rhinovirus  Will need echo in 3 months to re-evaluate LVEF    Questionable LV thrombus: Noted on 2D echo which lead to cMRI - that confirms no LV thrombus.  However, cmRI notes findings of a mass attached to the pulmonary ridge though atrial thrombus would be unusual in the absence of atrial arrhythmia.  TEE has been arranged for Monday.  Continue Lovenox  for now.  I spoke to her about the procedure. She does not have any difficulty swallowing. I explained risks of esophageal perforation and tear are less than 1/700, but can happen and can be life threatening as well as other risks. Throat irritation is common.   Poorly controlled diabetes: A1c greater than 15 Management per primary team   Hypertension: Blood pressures are well-controlled. Currently being diuresed and see above.    Hyperlipidemia: Most recent LDL 96 mg/dL. Given  the fact that she is a diabetic would recommend a goal LDL <70 mg/dL.  Started Crestor  20 mg p.o. nightly 07/21/2023 recommend 6-week follow-up fasting lipids and CMP to evaluate LFTs.     Efraim Grange, MD [facility]  Pager: [phone] Office: [phone] 07/25/2023 8:46 AM

## 2023-07-25 NOTE — Progress Notes (Signed)
 PROGRESS NOTE    Rilyn Scroggs  NFA:[id number] DOB: 08/03/1965 DOA: 07/18/2023 PCP: Vicente Graham, No     Brief Narrative:  Modean Mccullum is a 58 year old female with diabetes mellitus and hypertension who presents to the hospital for shortness of breath, productive cough and is found to be hypoxic with a pulse ox of 86% on room air, tachypneic and tachycardic.  CT scan of the chest: Mild multifocal infiltrates in the upper lobe, large bilateral effusions and significant atelectasis.  Started on treatment for pneumonia and heart failure with ceftriaxone , doxycycline  and furosemide .  Also found to have an EF of 25 to 30%, and A1c of 15.4.  Insulin  teaching and cardiology consult requested.  Patient underwent cardiac MRI as well as heart cath.  Currently awaiting TEE.  New events last 24 hours / Subjective: Had episode of blurry vision bilaterally this morning without any other focal deficits.  Blood sugar was normal.  On my examination, blurry vision is resolving.  She was able to get set up for breakfast, is watching TV.  After my evaluation, was notified by RN that patient had mild nosebleed.  Assessment & Plan:   Principal Problem:   Multifocal pneumonia Active Problems:   Acute hypoxic respiratory failure (HCC)   Non-insulin  dependent type 2 diabetes mellitus (HCC)   Essential hypertension   Acute hypoxemic respiratory failure (HCC)   Systolic CHF, acute (HCC)   Cardiomyopathy (HCC)   LBBB (left bundle branch block)   Mixed hyperlipidemia   Acute HFrEF (heart failure with reduced ejection fraction) (HCC)   Rhinovirus   Nonischemic cardiomyopathy (HCC)   Multifocal pneumonia - Flu, RSV, Covid negative  - Procalcitonin low  - Respiratory viral panel +Rhinovirus  - Continue supportive care   Acute systolic HF Nonischemic cardiomyopathy  - EF 25-30%  - Cardiology following - IV lasix , Jardiance , Losartan , Aldactone  - TOC consult for med assistance  - Heart cath with normal  coronary arteries   Question LV thrombus - Cardiac MRI "11 mm X 10 mm well circumscribed mass with attached to a central stalk to the pulmonary ridge. This is concerning for thrombus. Consider TEE evaluation." - Lovenox . TEE planned Monday   DM with hyperglycemia - Ha1c 15.4  - Semglee , NovoLog , SSI     DVT prophylaxis: Lovenox   SCDs Start: 07/19/23 0510 Place TED hose Start: 07/19/23 0510  Code Status: Full Family Communication: None at bedside  Disposition Plan: Home Status is: Inpatient Remains inpatient appropriate because: TEE Monday    Antimicrobials:  Anti-infectives (From admission, onward)    Start     Dose/Rate Route Frequency Ordered Stop   07/20/23 0100  cefTRIAXone  (ROCEPHIN ) 2 g in sodium chloride  0.9 % 100 mL IVPB  Status:  Discontinued        2 g 200 mL/hr over 30 Minutes Intravenous Every 24 hours 07/19/23 0509 07/20/23 1215   07/19/23 1300  doxycycline  (VIBRAMYCIN ) 100 mg in sodium chloride  0.9 % 250 mL IVPB  Status:  Discontinued        100 mg 125 mL/hr over 120 Minutes Intravenous Every 12 hours 07/19/23 0509 07/20/23 1215   07/19/23 0100  cefTRIAXone  (ROCEPHIN ) 1 g in sodium chloride  0.9 % 100 mL IVPB        1 g 200 mL/hr over 30 Minutes Intravenous  Once 07/19/23 0056 07/19/23 0157   07/19/23 0100  doxycycline  (VIBRA -TABS) tablet 100 mg        100 mg Oral  Once 07/19/23 0056 07/19/23 0122  Objective: Vitals:   07/25/23 0426 07/25/23 0656 07/25/23 0657 07/25/23 1012  BP: 96/60 (!) 138/104 (!) 146/96 108/68  Pulse: 89 (!) 101 94 97  Resp:    16  Temp: 98.1 F (36.7 C) 98.4 F (36.9 C)  98.6 F (37 C)  TempSrc: Oral Oral  Oral  SpO2: 96% 100%  100%  Weight: 60.2 kg     Height:        Intake/Output Summary (Last 24 hours) at 07/25/2023 1056 Last data filed at 07/25/2023 0852 Gross per 24 hour  Intake 600 ml  Output --  Net 600 ml   Filed Weights   07/23/23 0500 07/24/23 0456 07/25/23 0426  Weight: 60.8 kg 60.2 kg 60.2 kg     Examination: General exam: Appears calm and comfortable  Respiratory system: Clear to auscultation. Respiratory effort normal. Cardiovascular system: S1 & S2 heard, RRR. No pedal edema. Gastrointestinal system: Abdomen is nondistended, soft and nontender. Normal bowel sounds heard. Central nervous system: Alert and oriented. Non focal exam. Speech clear.   Extremities: Symmetric in appearance bilaterally  Skin: No rashes, lesions or ulcers on exposed skin  Psychiatry: Judgement and insight appear stable. Mood & affect appropriate.    Data Reviewed: I have personally reviewed following labs and imaging studies  CBC: Recent Labs  Lab 07/18/23 2231 07/18/23 2237 07/20/23 0403 07/21/23 0402 07/22/23 0411 07/23/23 0345 07/23/23 0947 07/23/23 0948 07/23/23 1300  WBC 11.9*   < > 7.3 6.8 5.2 6.7  --   --  6.2  NEUTROABS 9.9*  --   --   --   --   --   --   --   --   HGB 12.9   < > 11.8* 11.3* 12.6 12.5 12.2 12.6 13.7  HCT 40.0   < > 37.5 37.1 40.4 40.8 36.0 37.0 44.2  MCV 84.2   < > 86.8 89.0 86.9 88.3  --   --  88.2  PLT 406*   < > 322 331 369 411*  --   --  464*   < > = values in this interval not displayed.   Basic Metabolic Panel: Recent Labs  Lab 07/20/23 0403 07/20/23 0703 07/20/23 1606 07/21/23 0402 07/22/23 0411 07/23/23 0340 07/23/23 0345 07/23/23 0947 07/23/23 0948 07/24/23 0449  NA 135  --   --  139 141  --  138 141 141 139  K 2.8*  --    < > 3.4* 3.3*  --  3.9 3.8 3.8 3.7  CL 98  --   --  105 105  --  102  --   --  105  CO2 27  --   --  24 26  --  26  --   --  26  GLUCOSE 181*  --   --  92 93  --  115*  --   --  102*  BUN 8  --   --  11 17  --  16  --   --  14  CREATININE 0.44  --   --  0.44 0.51  --  0.61  --   --  0.63  CALCIUM  8.3*  --   --  8.4* 8.7*  --  8.1*  --   --  8.6*  MG  --  1.7  --   --  1.8 2.3  --   --   --  2.0   < > = values in this interval not displayed.   GFR:  Estimated Creatinine Clearance: 67.3 mL/min (by C-G formula based on  SCr of 0.63 mg/dL). Liver Function Tests: Recent Labs  Lab 07/18/23 2222 07/20/23 0403  AST 37 16  ALT 71* 37  ALKPHOS 83 49  BILITOT 0.5 0.9  PROT 6.6 6.0*  ALBUMIN 3.5 2.6*   No results for input(s): "LIPASE", "AMYLASE" in the last 168 hours. No results for input(s): "AMMONIA" in the last 168 hours. Coagulation Profile: No results for input(s): "INR", "PROTIME" in the last 168 hours. Cardiac Enzymes: No results for input(s): "CKTOTAL", "CKMB", "CKMBINDEX", "TROPONINI" in the last 168 hours. BNP (last 3 results) Recent Labs    07/18/23 2222  PROBNP 2,515.0*   HbA1C: No results for input(s): "HGBA1C" in the last 72 hours.  CBG: Recent Labs  Lab 07/24/23 1137 07/24/23 1618 07/24/23 2056 07/25/23 0713 07/25/23 1018  GLUCAP 184* 121* 184* 100* 166*   Lipid Profile: No results for input(s): "CHOL", "HDL", "LDLCALC", "TRIG", "CHOLHDL", "LDLDIRECT" in the last 72 hours.  Thyroid Function Tests: No results for input(s): "TSH", "T4TOTAL", "FREET4", "T3FREE", "THYROIDAB" in the last 72 hours. Anemia Panel: No results for input(s): "VITAMINB12", "FOLATE", "FERRITIN", "TIBC", "IRON", "RETICCTPCT" in the last 72 hours. Sepsis Labs: Recent Labs  Lab 07/18/23 2222 07/19/23 0640  PROCALCITON  --  <0.10  LATICACIDVEN 1.6  --     Recent Results (from the past 240 hours)  Resp panel by RT-PCR (RSV, Flu A&B, Covid) Anterior Nasal Swab     Status: None   Collection Time: 07/17/23  8:30 AM   Specimen: Anterior Nasal Swab  Result Value Ref Range Status   SARS Coronavirus 2 by RT PCR NEGATIVE NEGATIVE Final    Comment: (NOTE) SARS-CoV-2 target nucleic acids are NOT DETECTED.  The SARS-CoV-2 RNA is generally detectable in upper respiratory specimens during the acute phase of infection. The lowest concentration of SARS-CoV-2 viral copies this assay can detect is 138 copies/mL. A negative result does not preclude SARS-Cov-2 infection and should not be used as the sole basis  for treatment or other patient management decisions. A negative result may occur with  improper specimen collection/handling, submission of specimen other than nasopharyngeal swab, presence of viral mutation(s) within the areas targeted by this assay, and inadequate number of viral copies(<138 copies/mL). A negative result must be combined with clinical observations, patient history, and epidemiological information. The expected result is Negative.  Fact Sheet for Patients:  BloggerCourse.com  Fact Sheet for Healthcare Providers:  SeriousBroker.it  This test is no t yet approved or cleared by the [place]  FDA and  has been authorized for detection and/or diagnosis of SARS-CoV-2 by FDA under an Emergency Use Authorization (EUA). This EUA will remain  in effect (meaning this test can be used) for the duration of the COVID-19 declaration under Section 564(b)(1) of the Act, 21 U.S.C.section 360bbb-3(b)(1), unless the authorization is terminated  or revoked sooner.       Influenza A by PCR NEGATIVE NEGATIVE Final   Influenza B by PCR NEGATIVE NEGATIVE Final    Comment: (NOTE) The Xpert Xpress SARS-CoV-2/FLU/RSV plus assay is intended as an aid in the diagnosis of influenza from Nasopharyngeal swab specimens and should not be used as a sole basis for treatment. Nasal washings and aspirates are unacceptable for Xpert Xpress SARS-CoV-2/FLU/RSV testing.  Fact Sheet for Patients: BloggerCourse.com  Fact Sheet for Healthcare Providers: SeriousBroker.it  This test is not yet approved or cleared by the [place]  FDA and has been authorized for detection and/or diagnosis of  SARS-CoV-2 by FDA under an Emergency Use Authorization (EUA). This EUA will remain in effect (meaning this test can be used) for the duration of the COVID-19 declaration under Section 564(b)(1) of the Act, 21  U.S.C. section 360bbb-3(b)(1), unless the authorization is terminated or revoked.     Resp Syncytial Virus by PCR NEGATIVE NEGATIVE Final    Comment: (NOTE) Fact Sheet for Patients: BloggerCourse.com  Fact Sheet for Healthcare Providers: SeriousBroker.it  This test is not yet approved or cleared by the [place]  FDA and has been authorized for detection and/or diagnosis of SARS-CoV-2 by FDA under an Emergency Use Authorization (EUA). This EUA will remain in effect (meaning this test can be used) for the duration of the COVID-19 declaration under Section 564(b)(1) of the Act, 21 U.S.C. section 360bbb-3(b)(1), unless the authorization is terminated or revoked.  Performed at [facility], [address] [place], [place] [address]   Culture, blood (routine x 2)     Status: None   Collection Time: 07/18/23 10:09 PM   Specimen: BLOOD  Result Value Ref Range Status   Specimen Description   Final    BLOOD RIGHT ANTECUBITAL Performed at [facility], [address] [place], [place] [address]    Special Requests   Final    BOTTLES DRAWN AEROBIC AND ANAEROBIC Blood Culture results may not be optimal due to an inadequate volume of blood received in culture bottles Performed at [facility], [address] [place], [place] [address]    Culture   Final    NO GROWTH 5 DAYS Performed at [facility], [address] [place], [place] [address]    Report Status 07/24/2023 FINAL  Final  Culture, blood (routine x 2) Call MD if unable to obtain prior to antibiotics being given     Status: None   Collection Time: 07/19/23  6:40 AM   Specimen: BLOOD  Result Value Ref Range Status   Specimen Description   Final    BLOOD BLOOD RIGHT ARM AEROBIC BOTTLE ONLY ANAEROBIC BOTTLE ONLY Performed at [facility], [address], [place], [place] [address]    Special Requests   Final     BOTTLES DRAWN AEROBIC AND ANAEROBIC Blood Culture results may not be optimal due to an inadequate volume of blood received in culture bottles Performed at [facility], [address] [place], [place] [address]    Culture   Final    NO GROWTH 5 DAYS Performed at [facility], [address] [place], [place] [address]    Report Status 07/24/2023 FINAL  Final  Culture, blood (routine x 2) Call MD if unable to obtain prior to antibiotics being given     Status: None   Collection Time: 07/19/23  6:44 AM   Specimen: BLOOD  Result Value Ref Range Status   Specimen Description   Final    BLOOD BLOOD LEFT ARM AEROBIC BOTTLE ONLY ANAEROBIC BOTTLE ONLY Performed at [facility], [address] [place], [place] [address]    Special Requests   Final    BOTTLES DRAWN AEROBIC AND ANAEROBIC Blood Culture results may not be optimal due to an inadequate volume of blood received in culture bottles Performed at [facility], [address] [place], [place] [address]    Culture   Final    NO GROWTH 5 DAYS Performed at [facility], [address], [place], [place] [address]    Report Status 07/24/2023 FINAL  Final  Respiratory (~20 pathogens) panel by PCR     Status: Abnormal   Collection Time: 07/22/23  1:37 PM   Specimen: Nasopharyngeal Swab; Respiratory  Result Value Ref Range Status   Adenovirus NOT DETECTED NOT DETECTED Final   Coronavirus 229E NOT DETECTED NOT DETECTED Final    Comment: (NOTE) The Coronavirus on the Respiratory Panel, DOES NOT test for the novel  Coronavirus (2019 nCoV)    Coronavirus HKU1 NOT DETECTED NOT DETECTED Final   Coronavirus NL63 NOT DETECTED NOT DETECTED Final   Coronavirus OC43 NOT DETECTED NOT DETECTED Final   Metapneumovirus NOT DETECTED NOT DETECTED Final   Rhinovirus / Enterovirus DETECTED (A) NOT DETECTED Final   Influenza A NOT DETECTED NOT DETECTED Final   Influenza B NOT DETECTED  NOT DETECTED Final   Parainfluenza Virus 1 NOT DETECTED NOT DETECTED Final   Parainfluenza Virus 2 NOT DETECTED NOT DETECTED Final   Parainfluenza Virus 3 NOT DETECTED NOT DETECTED Final   Parainfluenza Virus 4 NOT DETECTED NOT DETECTED Final   Respiratory Syncytial Virus NOT DETECTED NOT DETECTED Final   Bordetella pertussis NOT DETECTED NOT DETECTED Final   Bordetella Parapertussis NOT DETECTED NOT DETECTED Final   Chlamydophila pneumoniae NOT DETECTED NOT DETECTED Final   Mycoplasma pneumoniae NOT DETECTED NOT DETECTED Final    Comment: Performed at [facility], [address], [place], [place] [address]  Expectorated Sputum Assessment w Gram Stain, Rflx to Resp Cult     Status: None   Collection Time: 07/22/23  2:37 PM   Specimen: Expectorated Sputum  Result Value Ref Range Status   Specimen Description EXPECTORATED SPUTUM  Final   Special Requests NONE  Final   Sputum evaluation   Final    THIS SPECIMEN IS ACCEPTABLE FOR SPUTUM CULTURE Performed at [facility], [address] [place], [place] [address]    Report Status 07/22/2023 FINAL  Final  Culture, Respiratory w Gram Stain     Status: None   Collection Time: 07/22/23  2:37 PM  Result Value Ref Range Status   Specimen Description   Final    EXPECTORATED SPUTUM Performed at [facility], [address], [place], [place] [address]    Special Requests   Final    NONE Reflexed from [id number] Performed at [facility], [address], [place], [place] [address]    Gram Stain   Final    FEW WBC PRESENT, PREDOMINANTLY PMN FEW BUDDING YEAST SEEN RARE GRAM POSITIVE COCCI IN CHAINS    Culture   Final    MODERATE Normal respiratory flora-no Staph aureus or Pseudomonas seen Performed at [facility], [address] [place], [place] [address]    Report Status 07/24/2023 FINAL  Final      Radiology Studies: No results found.      Scheduled Meds:   empagliflozin   10 mg Oral Daily   enoxaparin  (LOVENOX ) injection  60 mg Subcutaneous Q12H   furosemide   20 mg Intravenous Daily   insulin  aspart  0-5 Units Subcutaneous QHS   insulin  aspart  0-6 Units Subcutaneous TID WC   insulin  aspart  3 Units Subcutaneous TID WC   insulin  glargine-yfgn  15 Units Subcutaneous Daily   living well with diabetes book   Does not apply Once   losartan   25 mg Oral Q2200   rosuvastatin   20 mg Oral Daily  sodium chloride  flush  3 mL Intravenous Q12H   sodium chloride  flush  3 mL Intravenous Q12H   spironolactone   12.5 mg Oral q morning   Continuous Infusions:     LOS: 5 days   Time spent: 20 minutes   Daren Eck, DO [facility] 07/25/2023, 10:56 AM   Available via Epic secure chat 7am-7pm After these hours, please refer to coverage provider listed on [email or web address]

## 2023-07-25 NOTE — Progress Notes (Signed)
   Patient alerted Natasha Perotti, RN to room at [phone] stating that her vision was blurry in both eyes.  I did the NIHSS as an assessment.  No deficits noted at this time. Speech is clear and patient able to follow all directions.  Patient stated that vision was blurry upon waking at 0630.  Patient wears reading glasses.  Vitals taken:  Temp: 98.4, blood pressure on left arm: 138/104, Heart rate at 101 respirations at 16 and patient was sitting.  Retake of blood pressure:  146/96 at left arm.  Patient denies pain and dizziness at this time.  Last visit to Optometrist was last year.  Secure text sent to attending.  Continue to monitor.

## 2023-07-26 ENCOUNTER — Encounter (HOSPITAL_COMMUNITY)
Admission: EM | Disposition: A | Discharge status: Home / Self Care | Payer: Self-pay | Source: Home / Self Care | Attending: Internal Medicine

## 2023-07-26 ENCOUNTER — Other Ambulatory Visit (HOSPITAL_COMMUNITY): Payer: Self-pay

## 2023-07-26 ENCOUNTER — Encounter (HOSPITAL_COMMUNITY): Payer: Self-pay | Admitting: Cardiovascular Disease

## 2023-07-26 ENCOUNTER — Inpatient Hospital Stay (HOSPITAL_COMMUNITY): Admitting: Anesthesiology

## 2023-07-26 ENCOUNTER — Inpatient Hospital Stay (HOSPITAL_COMMUNITY)

## 2023-07-26 DIAGNOSIS — I340 Nonrheumatic mitral (valve) insufficiency: Secondary | ICD-10-CM

## 2023-07-26 DIAGNOSIS — J189 Pneumonia, unspecified organism: Secondary | ICD-10-CM | POA: Diagnosis not present

## 2023-07-26 DIAGNOSIS — I5189 Other ill-defined heart diseases: Secondary | ICD-10-CM

## 2023-07-26 DIAGNOSIS — I5021 Acute systolic (congestive) heart failure: Secondary | ICD-10-CM | POA: Diagnosis not present

## 2023-07-26 DIAGNOSIS — I10 Essential (primary) hypertension: Secondary | ICD-10-CM | POA: Diagnosis not present

## 2023-07-26 HISTORY — PX: TRANSESOPHAGEAL ECHOCARDIOGRAM (CATH LAB): EP1270

## 2023-07-26 LAB — CBC
HCT: 39.6 % (ref 36.0–46.0)
Hemoglobin: 11.7 g/dL — ABNORMAL LOW (ref 12.0–15.0)
MCH: 27.1 pg (ref 26.0–34.0)
MCHC: 29.5 g/dL — ABNORMAL LOW (ref 30.0–36.0)
MCV: 91.9 fL (ref 80.0–100.0)
Platelets: 453 10*3/uL — ABNORMAL HIGH (ref 150–400)
RBC: 4.31 MIL/uL (ref 3.87–5.11)
RDW: 14 % (ref 11.5–15.5)
WBC: 6.6 10*3/uL (ref 4.0–10.5)
nRBC: 0 % (ref 0.0–0.2)

## 2023-07-26 LAB — ECHO TEE
Est EF: 30
MV M vel: 4.32 m/s
MV Peak grad: 74.8 mmHg
MV VTI: 0.39 cm2
Radius: 0.75 cm

## 2023-07-26 LAB — GLUCOSE, CAPILLARY
Glucose-Capillary: 132 mg/dL — ABNORMAL HIGH (ref 70–99)
Glucose-Capillary: 90 mg/dL (ref 70–99)

## 2023-07-26 SURGERY — TRANSESOPHAGEAL ECHOCARDIOGRAM (TEE) (CATHLAB)
Anesthesia: Monitor Anesthesia Care

## 2023-07-26 MED ORDER — LOSARTAN POTASSIUM 25 MG PO TABS
25.0000 mg | ORAL_TABLET | Freq: Every day | ORAL | 0 refills | Status: DC
  Filled 2023-07-26: qty 30, 30d supply, fill #0

## 2023-07-26 MED ORDER — SODIUM CHLORIDE 0.9% FLUSH
3.0000 mL | Freq: Two times a day (BID) | INTRAVENOUS | Status: DC
Start: 2023-07-26 — End: 2023-07-26

## 2023-07-26 MED ORDER — LANCET DEVICE MISC
1.0000 | Freq: Three times a day (TID) | 0 refills | Status: AC
  Filled 2023-07-26: qty 1, fill #0

## 2023-07-26 MED ORDER — FUROSEMIDE 20 MG PO TABS
20.0000 mg | ORAL_TABLET | Freq: Every day | ORAL | 0 refills | Status: DC
  Filled 2023-07-26: qty 30, 30d supply, fill #0

## 2023-07-26 MED ORDER — ROSUVASTATIN CALCIUM 20 MG PO TABS
20.0000 mg | ORAL_TABLET | Freq: Every day | ORAL | 0 refills | Status: DC
Start: 2023-07-27 — End: 2023-09-08
  Filled 2023-07-26: qty 30, 30d supply, fill #0

## 2023-07-26 MED ORDER — INSULIN GLARGINE-YFGN 100 UNIT/ML ~~LOC~~ SOPN
15.0000 [IU] | PEN_INJECTOR | Freq: Every day | SUBCUTANEOUS | 0 refills | Status: DC
  Filled 2023-07-26: qty 15, 90d supply, fill #0

## 2023-07-26 MED ORDER — BLOOD GLUCOSE MONITOR SYSTEM W/DEVICE KIT
1.0000 | PACK | Freq: Three times a day (TID) | 0 refills | Status: AC
  Filled 2023-07-26: qty 1, 30d supply, fill #0

## 2023-07-26 MED ORDER — INSULIN ASPART 100 UNIT/ML FLEXPEN
0.0000 [IU] | PEN_INJECTOR | Freq: Three times a day (TID) | SUBCUTANEOUS | 0 refills | Status: DC
  Filled 2023-07-26: qty 15, 30d supply, fill #0

## 2023-07-26 MED ORDER — BLOOD GLUCOSE TEST VI STRP
1.0000 | ORAL_STRIP | Freq: Three times a day (TID) | 0 refills | Status: DC
Start: 2023-07-26 — End: 2023-09-03
  Filled 2023-07-26: qty 100, 30d supply, fill #0

## 2023-07-26 MED ORDER — PEN NEEDLES 31G X 5 MM MISC
1.0000 | Freq: Three times a day (TID) | 0 refills | Status: AC
  Filled 2023-07-26: qty 100, 30d supply, fill #0

## 2023-07-26 MED ORDER — PROPOFOL 10 MG/ML IV BOLUS
INTRAVENOUS | Status: DC | PRN
  Administered 2023-07-26 (×2): 50 mg via INTRAVENOUS
  Administered 2023-07-26: 70 mg via INTRAVENOUS

## 2023-07-26 MED ORDER — FUROSEMIDE 20 MG PO TABS: 20.0000 mg | ORAL_TABLET | Freq: Every day | ORAL | Status: DC

## 2023-07-26 MED ORDER — SODIUM CHLORIDE 0.9% FLUSH: 3.0000 mL | INTRAVENOUS | Status: DC | PRN

## 2023-07-26 MED ORDER — SODIUM CHLORIDE 0.9 % IV SOLN: INTRAVENOUS | Status: DC | PRN

## 2023-07-26 MED ORDER — LIDOCAINE 2% (20 MG/ML) 5 ML SYRINGE
INTRAMUSCULAR | Status: DC | PRN
  Administered 2023-07-26: 100 mg via INTRAVENOUS

## 2023-07-26 MED ORDER — ORAL CARE MOUTH RINSE: 15.0000 mL | OROMUCOSAL | Status: DC | PRN

## 2023-07-26 MED ORDER — EMPAGLIFLOZIN 10 MG PO TABS
10.0000 mg | ORAL_TABLET | Freq: Every day | ORAL | 0 refills | Status: DC
  Filled 2023-07-26: qty 30, 30d supply, fill #0

## 2023-07-26 MED ORDER — SPIRONOLACTONE 25 MG PO TABS
12.5000 mg | ORAL_TABLET | Freq: Every morning | ORAL | 0 refills | Status: DC
  Filled 2023-07-26: qty 15, 30d supply, fill #0

## 2023-07-26 MED ORDER — LANCETS MISC
1.0000 | Freq: Three times a day (TID) | 0 refills | Status: AC
  Filled 2023-07-26: qty 100, 30d supply, fill #0

## 2023-07-26 NOTE — H&P (View-Only) (Signed)
 Patient Name: Natasha Hudson Date of Encounter: 07/26/2023 [facility] Cardiologist: Olinda Bertrand, DO (NEW)  Interval Summary  .    BP 107/60. Cr 0.63.  Denies any chest pain or dyspnea  Vital Signs .    Vitals:   07/25/23 0657 07/25/23 1012 07/25/23 2018 07/26/23 0449  BP: (!) 146/96 108/68 107/63 107/60  Pulse: 94 97 96 90  Resp:  16 20 20   Temp:  98.6 F (37 C) 98.3 F (36.8 C) 98.5 F (36.9 C)  TempSrc:  Oral Oral Oral  SpO2:  100% 98% 98%  Weight:    63.8 kg  Height:        Intake/Output Summary (Last 24 hours) at 07/26/2023 0846 Last data filed at 07/25/2023 1811 Gross per 24 hour  Intake 480 ml  Output 800 ml  Net -320 ml      07/26/2023    4:49 AM 07/25/2023    4:26 AM 07/24/2023    4:56 AM  Last 3 Weights  Weight (lbs) 140 lb 10.5 oz 132 lb 11.2 oz 132 lb 11.5 oz  Weight (kg) 63.8 kg 60.192 kg 60.2 kg         Telemetry/ECG Normal sinus rhythm - Personally Reviewed  Echo  07/19/2023  1. Left ventricular ejection fraction, by estimation, is 25 to 30%. Left  ventricular ejection fraction by 2D MOD biplane is 27.2 %. The left  ventricle has severely decreased function. The left ventricle has no  regional wall motion abnormalities with  septal-lateral dyssynchrony consistent with LBBB and septal and inferior  wall severe hypokinesis. Indeterminate diastolic filling due to E-A  fusion. There is some concern for a small LV apical thrombus on Definity   images, I cannot find it on the  non-contrast enhanced images. Consider cardiac MRI to investigate further.   2. Right ventricular systolic function is normal. The right ventricular  size is normal. There is mildly elevated pulmonary artery systolic  pressure. The estimated right ventricular systolic pressure is 37.3 mmHg.   3. The mitral valve is abnormal. Moderate to severe mitral valve  regurgitation (severe visually, moderate by PISA ERO 0.21 cm^2). No  evidence of mitral stenosis. The mean  mitral valve gradient is 6.0 mmHg.  Though mean gradient is elevated, visually  valve opens well. Elevated gradient may be due to high flow with  significant mitral regurgitation.   4. The aortic valve is tricuspid. There is mild calcification of the  aortic valve. Aortic valve regurgitation is not visualized. No aortic  stenosis is present.   5. The inferior vena cava is normal in size with greater than 50%  respiratory variability, suggesting right atrial pressure of 3 mmHg.   6. There is a left pleural effusion. a small pericardial effusion is  present. The pericardial effusion is localized near the right atrium.   cMRI 07/22/2023 1. Severe decrease in left ventricular systolic function (LVEF =27%). No LV apical thrombus noted. Moderate dilation.   2. Normal right ventricular systolic function (RVEF =45%).   3. There is a 11 mm X 10 mm well circumscribed mass with attached to a central stalk to the pulmonary ridge. This is concerning for thrombus. Consider TEE evaluation.   4. Qualitatively, there is significant mitral regurgitation. Regurgitant fraction 42%. Suggestive of severe regurgitation.  Left and right heart cath 07/23/2023 1.  Angiographically normal coronary arteries (right dominant) 2.  Right heart catheterization data: Right atrial pressure mean 3 mmHg RV pressure 32 over 3 mmHg PA pressure  35/14 mean 20 mmHg Pulmonary capillary wedge pressure A-wave 14, V wave 15, mean 13 mmHg LVEDP 21 mmHg Cardiac output 5.2 L/min, cardiac index 3.1 L/min/m   Conclusion: Patient with normal coronary arteries and well compensated hemodynamics  EKG: ( No new  Telemetry: (personally reviewed by me) Sinus rhythm    Physical Exam .     General: Age appropriate, hemodynamically stable, no acute distress HEENT: Moist membranes, trachea midline, JVP(improving) Lungs: Clear to auscultation with rales at the bases, no rhonchi's or wheezing Heart: regular, positive S1-S2,  holosystolic murmur heard at the apex no gallops or rubs Abdomen: Soft, nontender, distended, positive bowel sounds all 4 quadrants Extremities: warm to touch, 1+ pitting edema bilaterally Neuro: Alert x 4, nonfocal Psych: Cooperative and pleasant  Assessment & Plan .    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 Hyperlipidemia   Recommendations:  Newly discovered heart failure with reduced EF Nonischemic Cardiomyopathy LBBB Stage C, NYHA class II/III LHC/RHC on 5/16 showed normal coronary arteries, will compensated hemodynamics (RA 3, RV 32/3, PA 35/14/2, PCWP 13, LVEDP 21, CI 3.1) 0 Appears euvolemic, will switch to p.o. Lasix  Cont. Jardiance   cont Losartan  25mg  po qPM Cont Aldactone  12.5mg  po QAM  Primary to look into medication cost coverage for Entresto, Jardiance , etc.  Resp panel positive for Rhinovirus  Will need echo in 3 months to re-evaluate LVEF    Questionable LV thrombus: Noted on 2D echo which lead to cMRI - that confirms no LV thrombus.  However, cmRI notes findings of a mass attached to the pulmonary ridge though atrial thrombus would be unusual in the absence of atrial arrhythmia.  TEE has been arranged for today Continue Lovenox  for now.  After careful review of history and examination, the risks and benefits of transesophageal echocardiogram have been explained including risks of esophageal damage, perforation (1:10,000 risk), bleeding, pharyngeal hematoma as well as other potential complications associated with conscious sedation including aspiration, arrhythmia, respiratory failure and death. Alternatives to treatment were discussed, questions were answered. Patient is willing to proceed.     Poorly controlled diabetes: A1c greater than 15 Management  per primary team   Hypertension: Blood pressures are well-controlled. Currently being diuresed and see above.    Hyperlipidemia: Most recent LDL 96 mg/dL. Given the fact that she is a diabetic would recommend a goal LDL <70 mg/dL.  Started Crestor  20 mg p.o. nightly 07/21/2023 recommend 6-week follow-up fasting lipids and CMP to evaluate LFTs.     Wendie Hamburg, MD [place]  [facility]  Pager: [phone] Office: [phone] 07/26/2023 8:46 AM

## 2023-07-26 NOTE — Transfer of Care (Signed)
 Immediate Anesthesia Transfer of Care Note  Patient: Sharol Croghan  Procedure(s) Performed: TRANSESOPHAGEAL ECHOCARDIOGRAM  Patient Location: Cath Lab  Anesthesia Type:MAC  Level of Consciousness: awake, alert , and oriented  Airway & Oxygen Therapy: Patient Spontanous Breathing and Patient connected to nasal cannula oxygen  Post-op Assessment: Report given to RN and Post -op Vital signs reviewed and stable  Post vital signs: Reviewed and stable  Last Vitals:  Vitals Value Taken Time  BP    Temp    Pulse 87 07/26/23 1354  Resp 21 07/26/23 1354  SpO2 96 % 07/26/23 1354  Vitals shown include unfiled device data.  Last Pain:  Vitals:   07/26/23 1248  TempSrc:   PainSc: 0-No pain      Patients Stated Pain Goal: 0 (07/23/23 0741)  Complications: No notable events documented.

## 2023-07-26 NOTE — Interval H&P Note (Signed)
 History and Physical Interval Note:  07/26/2023 12:43 PM  Natasha Hudson  has presented today for surgery, with the diagnosis of MR, LAA.  The various methods of treatment have been discussed with the patient and family. After consideration of risks, benefits and other options for treatment, the patient has consented to  Procedure(s): TRANSESOPHAGEAL ECHOCARDIOGRAM (N/A) as a surgical intervention.  The patient's history has been reviewed, patient examined, no change in status, stable for surgery.  I have reviewed the patient's chart and labs.  Questions were answered to the patient's satisfaction.     Gustav Knueppel

## 2023-07-26 NOTE — Progress Notes (Signed)
 Patient is having at TEE today at 1330 and was questioning whether to give the 4am dose of lovenox . Notified J. Sharion Davidson, NP. He stated that the 4am lovenox  dose should not be given d/t having a TEE today.

## 2023-07-26 NOTE — Anesthesia Preprocedure Evaluation (Signed)
 Anesthesia Evaluation  Patient identified by MRN, date of birth, ID band Patient awake    Reviewed: Allergy & Precautions, H&P , NPO status , Patient's Chart, lab work & pertinent test results  Airway Mallampati: II  TM Distance: >3 FB Neck ROM: Full    Dental no notable dental hx.    Pulmonary neg pulmonary ROS   Pulmonary exam normal breath sounds clear to auscultation       Cardiovascular hypertension, Pt. on medications +CHF  negative cardio ROS Normal cardiovascular exam Rhythm:Regular Rate:Normal     Neuro/Psych negative neurological ROS  negative psych ROS   GI/Hepatic negative GI ROS, Neg liver ROS,,,  Endo/Other  negative endocrine ROSdiabetes, Type 2    Renal/GU negative Renal ROS  negative genitourinary   Musculoskeletal negative musculoskeletal ROS (+)    Abdominal   Peds negative pediatric ROS (+)  Hematology negative hematology ROS (+)   Anesthesia Other Findings   Reproductive/Obstetrics negative OB ROS                             Anesthesia Physical Anesthesia Plan  ASA: 3  Anesthesia Plan: MAC   Post-op Pain Management: Minimal or no pain anticipated   Induction: Intravenous  PONV Risk Score and Plan: 2 and Propofol  infusion and Treatment may vary due to age or medical condition  Airway Management Planned:   Additional Equipment:   Intra-op Plan:   Post-operative Plan:   Informed Consent: I have reviewed the patients History and Physical, chart, labs and discussed the procedure including the risks, benefits and alternatives for the proposed anesthesia with the patient or authorized representative who has indicated his/her understanding and acceptance.     Dental advisory given  Plan Discussed with: CRNA  Anesthesia Plan Comments:        Anesthesia Quick Evaluation

## 2023-07-26 NOTE — Discharge Summary (Addendum)
 Physician Discharge Summary  Natasha Hudson ZOX:[id number] DOB: [birth year] DOA: 07/18/2023  PCP: Jackqueline Mason, NP  Admit date: 07/18/2023 Discharge date: 07/26/2023  Admitted From: Home Disposition:  Home  Recommendations for Outpatient Follow-up:  Follow up with PCP as scheduled 08/06/2023 Follow-up with cardiology as scheduled 08/13/2023  Discharge Condition: Stable CODE STATUS: Full code Diet recommendation: Heart healthy diet  Brief/Interim Summary: Natasha Hudson is a 58 year old female with diabetes mellitus and hypertension who presents to the hospital for shortness of breath, productive cough and is found to be hypoxic with a pulse ox of 86% on room air, tachypneic and tachycardic.   CT scan of the chest: Mild multifocal infiltrates in the upper lobe, large bilateral effusions and significant atelectasis.  Started on treatment for pneumonia and heart failure with ceftriaxone , doxycycline  and furosemide .  Also found to have an EF of 25 to 30%, and A1c of 15.4.  Insulin  teaching and cardiology consult requested.  Patient tested positive for rhinovirus.  Her respiratory status with pneumonia improved with supportive care as well as few days of antibiotics.  Due to her heart failure, patient underwent cardiac MRI as well as heart cath.  She was diuresed with IV Lasix .  Cardiac MRI revealed concern for thrombus.  She underwent TEE which was negative.  She was discharged home in stable condition.   Discharge Diagnoses:   Principal Problem:   Multifocal pneumonia Active Problems:   Acute hypoxic respiratory failure (HCC)   Non-insulin  dependent type 2 diabetes mellitus (HCC)   Essential hypertension   Acute hypoxemic respiratory failure (HCC)   Systolic CHF, acute (HCC)   Cardiomyopathy (HCC)   LBBB (left bundle branch block)   Mixed hyperlipidemia   Acute HFrEF (heart failure with reduced ejection fraction) (HCC)   Rhinovirus   Nonischemic cardiomyopathy (HCC)    Nonrheumatic mitral (valve) insufficiency   Left atrial mass   Multifocal pneumonia - Flu, RSV, Covid negative  - Procalcitonin low  - Respiratory viral panel +Rhinovirus  - Continue supportive care - Improved   Acute systolic HF Nonischemic cardiomyopathy  - EF 25-30%  - Cardiology following - Lasix , Jardiance , Losartan , Aldactone  - TOC consult for med assistance  - Heart cath with normal coronary arteries    Question LV thrombus - Cardiac MRI "11 mm X 10 mm well circumscribed mass with attached to a central stalk to the pulmonary ridge. This is concerning for thrombus. Consider TEE evaluation." - TEE negative for thrombus   DM with hyperglycemia - Ha1c 15.4  - Semglee , SSI     Discharge Instructions  Discharge Instructions     (HEART FAILURE PATIENTS) Call MD:  Anytime you have any of the following symptoms: 1) 3 pound weight gain in 24 hours or 5 pounds in 1 week 2) shortness of breath, with or without a dry hacking cough 3) swelling in the hands, feet or stomach 4) if you have to sleep on extra pillows at night in order to breathe.   Complete by: As directed    Amb Referral to Nutrition and Diabetic Education   Complete by: As directed    Call MD for:  difficulty breathing, headache or visual disturbances   Complete by: As directed    Call MD for:  extreme fatigue   Complete by: As directed    Call MD for:  persistant dizziness or light-headedness   Complete by: As directed    Call MD for:  persistant nausea and vomiting   Complete by: As directed  Call MD for:  severe uncontrolled pain   Complete by: As directed    Call MD for:  temperature >100.4   Complete by: As directed    Diet - low sodium heart healthy   Complete by: As directed    Discharge instructions   Complete by: As directed    You were cared for by a hospitalist during your hospital stay. If you have any questions about your discharge medications or the care you received while you were in the  hospital after you are discharged, you can call the unit and ask to speak with the hospitalist on call if the hospitalist that took care of you is not available. Once you are discharged, your primary care physician will handle any further medical issues. Please note that NO REFILLS for any discharge medications will be authorized once you are discharged, as it is imperative that you return to your primary care physician (or establish a relationship with a primary care physician if you do not have one) for your aftercare needs so that they can reassess your need for medications and monitor your lab values.   Increase activity slowly   Complete by: As directed       Allergies as of 07/26/2023       Reactions   Metformin Diarrhea   Only a high dose        Medication List     STOP taking these medications    azithromycin  250 MG tablet Commonly known as: ZITHROMAX        TAKE these medications    Blood Glucose Monitoring Suppl Devi 1 each by Does not apply route 3 (three) times daily. May dispense any manufacturer covered by patient's insurance.   BLOOD GLUCOSE TEST STRIPS Strp 1 each by Does not apply route 3 (three) times daily. Use as directed to check blood sugar. May dispense any manufacturer covered by patient's insurance and fits patient's device.   empagliflozin  10 MG Tabs tablet Commonly known as: JARDIANCE  Take 1 tablet (10 mg total) by mouth daily. Start taking on: Jul 27, 2023   furosemide  20 MG tablet Commonly known as: LASIX  Take 1 tablet (20 mg total) by mouth daily. Start taking on: Jul 27, 2023   insulin  aspart 100 UNIT/ML FlexPen Commonly known as: NOVOLOG  Inject 0-6 Units into the skin 3 (three) times daily with meals. Check Blood Glucose (BG) and inject per scale: BG <150= 0 unit; BG 150-200= 1 unit; BG 201-250= 2 unit; BG 251-300= 3 unit; BG 301-350= 4 unit; BG 351-400= 5 unit; BG >400= 6 unit and Call Primary Care.   insulin  glargine-yfgn 100 UNIT/ML  Pen Commonly known as: SEMGLEE  Inject 15 Units into the skin daily. May substitute as needed per insurance.   Lancet Device Misc 1 each by Does not apply route 3 (three) times daily. May dispense any manufacturer covered by patient's insurance.   Lancets Misc 1 each by Does not apply route 3 (three) times daily. Use as directed to check blood sugar. May dispense any manufacturer covered by patient's insurance and fits patient's device.   losartan  25 MG tablet Commonly known as: COZAAR  Take 1 tablet (25 mg total) by mouth daily at 10 pm.   Pen Needles 31G X 5 MM Misc 1 each by Does not apply route 3 (three) times daily. May dispense any manufacturer covered by patient's insurance.   rosuvastatin  20 MG tablet Commonly known as: CRESTOR  Take 1 tablet (20 mg total) by mouth daily. Start taking  on: Jul 27, 2023   spironolactone  25 MG tablet Commonly known as: ALDACTONE  Take 0.5 tablets (12.5 mg total) by mouth every morning. Start taking on: Jul 27, 2023        Follow-up Information     Jackqueline Mason, NP. Go on 08/06/2023.   Specialty: Nurse Practitioner Contact information: [address], [address] [place] [address] [phone]         Gerald Kitty., NP. Go on 08/13/2023.   Specialty: Cardiology Contact information: [address] [place] [address] [phone]                Allergies  Allergen Reactions   Metformin Diarrhea    Only a high dose      Procedures/Studies: EP STUDY Result Date: 07/26/2023 See surgical note for result.  CARDIAC CATHETERIZATION Result Date: 07/23/2023 1.  Angiographically normal coronary arteries (right dominant) 2.  Right heart catheterization data: Right atrial pressure mean 3 mmHg RV pressure 32 over 3 mmHg PA pressure 35/14 mean 20 mmHg Pulmonary capillary wedge pressure A-wave 14, V wave 15, mean 13 mmHg LVEDP 21 mmHg Cardiac output 5.2 L/min, cardiac index 3.1 L/min/m Conclusion:  Patient with normal coronary arteries and well compensated hemodynamics   MR CARDIAC MORPHOLOGY W WO CONTRAST Result Date: 07/22/2023 CLINICAL DATA:  Clinical question of left ventricular thrombus Study assumes HCT of 40 and BSA of 1.68 m2. EXAM: CARDIAC MRI TECHNIQUE: The patient was scanned on a 1.5 Tesla GE magnet. A dedicated cardiac coil was used. Functional imaging was done using Fiesta sequences. 2,3, and 4 chamber views were done to assess for RWMA's. Modified Simpson's rule using was used to calculate an ejection fraction on a dedicated work [employer], [employer]. The patient received 9 cc of Gadavist . After 10 minutes inversion recovery sequences were used to assess for infiltration and scar tissue. Flow quantification was performed 2 times during this examination with flow quantification performed at the levels of the ascending aorta above the valve, pulmonary artery above the valve. CONTRAST:  9 cc  of Gadavist  FINDINGS: 1. Moderate increase in left ventricular size, with LVEDD 56 mm, and LVEDVi 103 mL/m2. Mild left ventricular hypertrophy with septal thickness 11 mm and mass index of 67 g/m2. Severe decrease in left ventricular systolic function (LVEF =27%). Abnormal septal motion. Apical septal dyskinesis. Otherwise global dysfunction. Left ventricular parametric mapping notable for mild increase in native T1 (1212 ms) in the basal inferoseptal and normal T2. Cannot perform ECV due to post T2 acquisition. There is no late gadolinium enhancement in the left ventricular myocardium. No LV apical thrombus noted. 2. Normal right ventricular size with RVEDVI 44 mL/m2. Normal right ventricular thickness. Normal right ventricular systolic function (RVEF =45%). Septal hypokinesis. 3. Normal right atrial size. Moderate left atrial dilation, indexed volume 43 ml/m2. There is a 11 mm X 10 mm well circumscribed mass with attached to a central stalk to the pulmonary ridge. An thrombus. This is seen on the  four chamber view and in the LGE sequences. 4. Normal size of the aortic root, ascending aorta and pulmonary artery. 5. Valve assessment: Aortic Valve: Valve morphology is trileaflet. Qualitatively, there is no significant regurgitation. Regurgitant fraction 7%. Suggestive of mild regurgitation. Mean gradient 1.42 Mm Hg. Pulmonic Valve: Qualitatively, there is mild regurgitation. Regurgitant fraction 7%. Suggestive of mild regurgitation. Tricuspid Valve: Qualitatively, there is no significant regurgitation. Regurgitant fraction 8%. Suggestive of mild regurgitation. Mitral Valve: Multiple jets. Qualitatively, there is significant  regurgitation. Regurgitant fraction 42%. Suggestive of severe regurgitation. 6. Normal pericardium. Moderate pericardial effusion overlying the right atrial and ventricular without increase in pericardial pressures. 7. Bilateral pleural effusions. Recommended dedicated study if concerned for non-cardiac pathology. 8. Lash Artifacts noted. This decreased the sensitivity of LGE acquisitions IMPRESSION: 1. Severe decrease in left ventricular systolic function (LVEF =27%). No LV apical thrombus noted. Moderate dilation. 2. Normal right ventricular systolic function (RVEF =45%). 3. There is a 11 mm X 10 mm well circumscribed mass with attached to a central stalk to the pulmonary ridge. This is concerning for thrombus. Consider TEE evaluation. 4. Qualitatively, there is significant mitral regurgitation. Regurgitant fraction 42%. Suggestive of severe regurgitation. Gloriann Larger MD Electronically Signed   By: Gloriann Larger M.D.   On: 07/22/2023 16:25   MR CARDIAC VELOCITY FLOW MAP Result Date: 07/22/2023 CLINICAL DATA:  Clinical question of left ventricular thrombus Study assumes HCT of 40 and BSA of 1.68 m2. EXAM: CARDIAC MRI TECHNIQUE: The patient was scanned on a 1.5 Tesla GE magnet. A dedicated cardiac coil was used. Functional imaging was done using Fiesta sequences. 2,3,  and 4 chamber views were done to assess for RWMA's. Modified Simpson's rule using was used to calculate an ejection fraction on a dedicated work [employer] officer, [employer]. The patient received 9 cc of Gadavist . After 10 minutes inversion recovery sequences were used to assess for infiltration and scar tissue. Flow quantification was performed 2 times during this examination with flow quantification performed at the levels of the ascending aorta above the valve, pulmonary artery above the valve. CONTRAST:  9 cc  of Gadavist  FINDINGS: 1. Moderate increase in left ventricular size, with LVEDD 56 mm, and LVEDVi 103 mL/m2. Mild left ventricular hypertrophy with septal thickness 11 mm and mass index of 67 g/m2. Severe decrease in left ventricular systolic function (LVEF =27%). Abnormal septal motion. Apical septal dyskinesis. Otherwise global dysfunction. Left ventricular parametric mapping notable for mild increase in native T1 (1212 ms) in the basal inferoseptal and normal T2. Cannot perform ECV due to post T2 acquisition. There is no late gadolinium enhancement in the left ventricular myocardium. No LV apical thrombus noted. 2. Normal right ventricular size with RVEDVI 44 mL/m2. Normal right ventricular thickness. Normal right ventricular systolic function (RVEF =45%). Septal hypokinesis. 3. Normal right atrial size. Moderate left atrial dilation, indexed volume 43 ml/m2. There is a 11 mm X 10 mm well circumscribed mass with attached to a central stalk to the pulmonary ridge. An thrombus. This is seen on the four chamber view and in the LGE sequences. 4. Normal size of the aortic root, ascending aorta and pulmonary artery. 5. Valve assessment: Aortic Valve: Valve morphology is trileaflet. Qualitatively, there is no significant regurgitation. Regurgitant fraction 7%. Suggestive of mild regurgitation. Mean gradient 1.42 Mm Hg. Pulmonic Valve: Qualitatively, there is mild regurgitation. Regurgitant fraction 7%.  Suggestive of mild regurgitation. Tricuspid Valve: Qualitatively, there is no significant regurgitation. Regurgitant fraction 8%. Suggestive of mild regurgitation. Mitral Valve: Multiple jets. Qualitatively, there is significant regurgitation. Regurgitant fraction 42%. Suggestive of severe regurgitation. 6. Normal pericardium. Moderate pericardial effusion overlying the right atrial and ventricular without increase in pericardial pressures. 7. Bilateral pleural effusions. Recommended dedicated study if concerned for non-cardiac pathology. 8. Lash Artifacts noted. This decreased the sensitivity of LGE acquisitions IMPRESSION: 1. Severe decrease in left ventricular systolic function (LVEF =27%). No LV apical thrombus noted. Moderate dilation. 2. Normal right ventricular systolic function (RVEF =45%). 3. There is a 11  mm X 10 mm well circumscribed mass with attached to a central stalk to the pulmonary ridge. This is concerning for thrombus. Consider TEE evaluation. 4. Qualitatively, there is significant mitral regurgitation. Regurgitant fraction 42%. Suggestive of severe regurgitation. Gloriann Larger MD Electronically Signed   By: Gloriann Larger M.D.   On: 07/22/2023 16:25   MR CARDIAC VELOCITY FLOW MAP Result Date: 07/22/2023 CLINICAL DATA:  Clinical question of left ventricular thrombus Study assumes HCT of 40 and BSA of 1.68 m2. EXAM: CARDIAC MRI TECHNIQUE: The patient was scanned on a 1.5 Tesla GE magnet. A dedicated cardiac coil was used. Functional imaging was done using Fiesta sequences. 2,3, and 4 chamber views were done to assess for RWMA's. Modified Simpson's rule using was used to calculate an ejection fraction on a dedicated work [employer] officer, [employer]. The patient received 9 cc of Gadavist . After 10 minutes inversion recovery sequences were used to assess for infiltration and scar tissue. Flow quantification was performed 2 times during this examination with flow quantification  performed at the levels of the ascending aorta above the valve, pulmonary artery above the valve. CONTRAST:  9 cc  of Gadavist  FINDINGS: 1. Moderate increase in left ventricular size, with LVEDD 56 mm, and LVEDVi 103 mL/m2. Mild left ventricular hypertrophy with septal thickness 11 mm and mass index of 67 g/m2. Severe decrease in left ventricular systolic function (LVEF =27%). Abnormal septal motion. Apical septal dyskinesis. Otherwise global dysfunction. Left ventricular parametric mapping notable for mild increase in native T1 (1212 ms) in the basal inferoseptal and normal T2. Cannot perform ECV due to post T2 acquisition. There is no late gadolinium enhancement in the left ventricular myocardium. No LV apical thrombus noted. 2. Normal right ventricular size with RVEDVI 44 mL/m2. Normal right ventricular thickness. Normal right ventricular systolic function (RVEF =45%). Septal hypokinesis. 3. Normal right atrial size. Moderate left atrial dilation, indexed volume 43 ml/m2. There is a 11 mm X 10 mm well circumscribed mass with attached to a central stalk to the pulmonary ridge. An thrombus. This is seen on the four chamber view and in the LGE sequences. 4. Normal size of the aortic root, ascending aorta and pulmonary artery. 5. Valve assessment: Aortic Valve: Valve morphology is trileaflet. Qualitatively, there is no significant regurgitation. Regurgitant fraction 7%. Suggestive of mild regurgitation. Mean gradient 1.42 Mm Hg. Pulmonic Valve: Qualitatively, there is mild regurgitation. Regurgitant fraction 7%. Suggestive of mild regurgitation. Tricuspid Valve: Qualitatively, there is no significant regurgitation. Regurgitant fraction 8%. Suggestive of mild regurgitation. Mitral Valve: Multiple jets. Qualitatively, there is significant regurgitation. Regurgitant fraction 42%. Suggestive of severe regurgitation. 6. Normal pericardium. Moderate pericardial effusion overlying the right atrial and ventricular without  increase in pericardial pressures. 7. Bilateral pleural effusions. Recommended dedicated study if concerned for non-cardiac pathology. 8. Lash Artifacts noted. This decreased the sensitivity of LGE acquisitions IMPRESSION: 1. Severe decrease in left ventricular systolic function (LVEF =27%). No LV apical thrombus noted. Moderate dilation. 2. Normal right ventricular systolic function (RVEF =45%). 3. There is a 11 mm X 10 mm well circumscribed mass with attached to a central stalk to the pulmonary ridge. This is concerning for thrombus. Consider TEE evaluation. 4. Qualitatively, there is significant mitral regurgitation. Regurgitant fraction 42%. Suggestive of severe regurgitation. Gloriann Larger MD Electronically Signed   By: Gloriann Larger M.D.   On: 07/22/2023 16:25   DG CHEST PORT 1 VIEW Result Date: 07/20/2023 CLINICAL DATA:  Pleural effusion EXAM: PORTABLE CHEST 1 VIEW COMPARISON:  Jul 18, 2023 FINDINGS: Improving bilateral pulmonary infiltrates with residual bilateral pleural effusions and basilar atelectasis right more than left. Heart and mediastinum within normal limits IMPRESSION: Improving bilateral pulmonary infiltrates with residual bilateral pleural effusions and basilar atelectasis right more than left. Electronically Signed   By: Fredrich Jefferson M.D.   On: 07/20/2023 08:57   ECHOCARDIOGRAM COMPLETE Result Date: 07/19/2023    ECHOCARDIOGRAM REPORT   Patient Name:   Natasha Hudson Date of Exam: 07/19/2023 Medical Rec #:  [id number]      Height:       65.0 in Accession #:    [id number]     Weight:       155.0 lb Date of Birth:  09-10-1965      BSA:          1.775 m Patient Age:    58 years       BP:           150/84 mmHg Patient Gender: F              HR:           114 bpm. Exam Location:  Inpatient Procedure: 2D Echo, Cardiac Doppler, Color Doppler and Intracardiac            Opacification Agent (Both Spectral and Color Flow Doppler were            utilized during procedure).  Indications:    I50.40* Unspecified combined systolic (congestive) and diastolic                 (congestive) heart failure  History:        Patient has no prior history of Echocardiogram examinations.                 Signs/Symptoms:Shortness of Breath and Dyspnea; Risk                 Factors:Hypertension and Diabetes. Pneumonia.  Sonographer:    Raynelle Callow RDCS Referring Phys: [id number] SUBRINA SUNDIL IMPRESSIONS  1. Left ventricular ejection fraction, by estimation, is 25 to 30%. Left ventricular ejection fraction by 2D MOD biplane is 27.2 %. The left ventricle has severely decreased function. The left ventricle has no regional wall motion abnormalities with septal-lateral dyssynchrony consistent with LBBB and septal and inferior wall severe hypokinesis. Indeterminate diastolic filling due to E-A fusion. There is some concern for a small LV apical thrombus on Definity  images, I cannot find it on the non-contrast enhanced images. Consider cardiac MRI to investigate further.  2. Right ventricular systolic function is normal. The right ventricular size is normal. There is mildly elevated pulmonary artery systolic pressure. The estimated right ventricular systolic pressure is 37.3 mmHg.  3. The mitral valve is abnormal. Moderate to severe mitral valve regurgitation (severe visually, moderate by PISA ERO 0.21 cm^2). No evidence of mitral stenosis. The mean mitral valve gradient is 6.0 mmHg. Though mean gradient is elevated, visually valve opens well. Elevated gradient may be due to high flow with significant mitral regurgitation.  4. The aortic valve is tricuspid. There is mild calcification of the aortic valve. Aortic valve regurgitation is not visualized. No aortic stenosis is present.  5. The inferior vena cava is normal in size with greater than 50% respiratory variability, suggesting right atrial pressure of 3 mmHg.  6. There is a left pleural effusion. a small pericardial effusion is present. The pericardial  effusion is localized near the right atrium. FINDINGS  Left Ventricle: Left ventricular ejection  fraction, by estimation, is 25 to 30%. Left ventricular ejection fraction by 2D MOD biplane is 27.2 %. The left ventricle has severely decreased function. The left ventricle has no regional wall motion abnormalities. Definity  contrast agent was given IV to delineate the left ventricular endocardial borders. The left ventricular internal cavity size was normal in size. There is no left ventricular hypertrophy. Indeterminate diastolic filling due to E-A fusion. Right Ventricle: The right ventricular size is normal. No increase in right ventricular wall thickness. Right ventricular systolic function is normal. There is mildly elevated pulmonary artery systolic pressure. The tricuspid regurgitant velocity is 2.93  m/s, and with an assumed right atrial pressure of 3 mmHg, the estimated right ventricular systolic pressure is 37.3 mmHg. Left Atrium: Left atrial size was normal in size. Right Atrium: Right atrial size was normal in size. Pericardium: There is a left pleural effusion. A small pericardial effusion is present. The pericardial effusion is localized near the right atrium. Mitral Valve: The mitral valve is abnormal. Moderate to severe mitral valve regurgitation. No evidence of mitral valve stenosis. MV peak gradient, 15.2 mmHg. The mean mitral valve gradient is 6.0 mmHg. Tricuspid Valve: The tricuspid valve is normal in structure. Tricuspid valve regurgitation is trivial. Aortic Valve: The aortic valve is tricuspid. There is mild calcification of the aortic valve. Aortic valve regurgitation is not visualized. No aortic stenosis is present. Pulmonic Valve: The pulmonic valve was normal in structure. Pulmonic valve regurgitation is trivial. Aorta: The aortic root is normal in size and structure. Venous: The inferior vena cava is normal in size with greater than 50% respiratory variability, suggesting right atrial  pressure of 3 mmHg. IAS/Shunts: No atrial level shunt detected by color flow Doppler.  LEFT VENTRICLE PLAX 2D                        Biplane EF (MOD) LVIDd:         4.60 cm         LV Biplane EF:   Left LVIDs:         4.10 cm                          ventricular LV PW:         1.10 cm                          ejection LV IVS:        1.30 cm                          fraction by LVOT diam:     1.90 cm                          2D MOD LV SV:         47                               biplane is LV SV Index:   27                               27.2 %. LVOT Area:     2.84 cm  LV Volumes (MOD) LV vol d, MOD    147.0  ml A2C: LV vol d, MOD    121.0 ml A4C: LV vol s, MOD    110.0 ml A2C: LV vol s, MOD    87.0 ml A4C: LV SV MOD A2C:   37.0 ml LV SV MOD A4C:   121.0 ml LV SV MOD BP:    36.8 ml RIGHT VENTRICLE             IVC RV S prime:     18.60 cm/s  IVC diam: 1.40 cm TAPSE (M-mode): 2.1 cm LEFT ATRIUM             Index        RIGHT ATRIUM           Index LA diam:        3.90 cm 2.20 cm/m   RA Area:     12.30 cm LA Vol (A2C):   50.6 ml 28.51 ml/m  RA Volume:   28.50 ml  16.06 ml/m LA Vol (A4C):   42.2 ml 23.77 ml/m LA Biplane Vol: 45.6 ml 25.69 ml/m  AORTIC VALVE LVOT Vmax:   120.00 cm/s LVOT Vmean:  77.450 cm/s LVOT VTI:    0.166 m  AORTA Ao Root diam: 2.80 cm Ao Asc diam:  3.20 cm MITRAL VALVE                  TRICUSPID VALVE MV Area (PHT): 7.70 cm       TR Peak grad:   34.3 mmHg MV Area VTI:   2.16 cm       TR Vmax:        293.00 cm/s MV Peak grad:  15.2 mmHg MV Mean grad:  6.0 mmHg       SHUNTS MV Vmax:       1.95 m/s       Systemic VTI:  0.17 m MV Vmean:      108.0 cm/s     Systemic Diam: 1.90 cm MV Decel Time: 99 msec MR Peak grad:    131.3 mmHg MR Mean grad:    78.0 mmHg MR Vmax:         573.00 cm/s MR Vmean:        414.0 cm/s MR PISA:         3.08 cm MR PISA Eff ROA: 21 mm MR PISA Radius:  0.70 cm MV E velocity: 146.00 cm/s MV A velocity: 170.50 cm/s MV E/A ratio:  0.86 Dalton McleanMD Electronically signed by  Archer Bear Signature Date/Time: 07/19/2023/2:03:58 PM    Final    CT Angio Chest PE W and/or Wo Contrast Result Date: 07/18/2023 CLINICAL DATA:  Suspected pulmonary embolism. EXAM: CT ANGIOGRAPHY CHEST WITH CONTRAST TECHNIQUE: Multidetector CT imaging of the chest was performed using the standard protocol during bolus administration of intravenous contrast. Multiplanar CT image reconstructions and MIPs were obtained to evaluate the vascular anatomy. RADIATION DOSE REDUCTION: This exam was performed according to the departmental dose-optimization program which includes automated exposure control, adjustment of the mA and/or kV according to patient size and/or use of iterative reconstruction technique. CONTRAST:  75mL OMNIPAQUE  IOHEXOL  350 MG/ML SOLN COMPARISON:  None Available. FINDINGS: Cardiovascular: The thoracic aorta is unremarkable. Satisfactory opacification of the pulmonary arteries to the segmental level. No evidence of pulmonary embolism. Normal heart size. No pericardial effusion. Mediastinum/Nodes: No enlarged mediastinal, hilar, or axillary lymph nodes. Thyroid gland, trachea, and esophagus demonstrate no significant findings. Lungs/Pleura: Mild multifocal bilateral upper lobe infiltrates are seen. Moderate to marked severity bibasilar  atelectasis and/or infiltrate is also noted. There are large bilateral pleural effusions. No pneumothorax is identified. Upper Abdomen: No acute abnormality. Musculoskeletal: No chest wall abnormality. No acute or significant osseous findings. Review of the MIP images confirms the above findings. IMPRESSION: 1. No evidence of pulmonary embolism. 2. Mild multifocal bilateral upper lobe infiltrates. 3. Moderate to marked severity bibasilar atelectasis and/or infiltrate. 4. Large bilateral pleural effusions. Electronically Signed   By: Virgle Grime M.D.   On: 07/18/2023 23:49   DG Chest Port 1 View Result Date: 07/18/2023 CLINICAL DATA:  Shortness of breath.   Question pneumonia. EXAM: PORTABLE CHEST 1 VIEW COMPARISON:  Radiograph yesterday FINDINGS: Worsening bibasilar opacities with increasing pleural effusions and bibasilar airspace disease. Diffuse bronchial and interstitial thickening. Stable heart size and mediastinal contours. No pneumothorax. IMPRESSION: 1. Worsening bibasilar opacities with increasing pleural effusions and bibasilar airspace disease. This may represent compressive atelectasis or pneumonia. 2. Diffuse bronchial and interstitial thickening, favor pulmonary edema. Electronically Signed   By: Chadwick Colonel M.D.   On: 07/18/2023 23:07   DG Chest 2 View Result Date: 07/17/2023 CLINICAL DATA:  Shortness of breath.  Nonproductive cough. EXAM: CHEST - 2 VIEW COMPARISON:  None Available. FINDINGS: Generalized interstitial opacity with fissure thickening. Heart size is normal. Unremarkable mediastinal contours. No significant pleural fluid. No pneumothorax. IMPRESSION: Interstitial opacity usually from edema but atypical infection could also give a similar appearance. Electronically Signed   By: Ronnette Coke M.D.   On: 07/17/2023 09:40       Discharge Exam: Vitals:   07/26/23 1404 07/26/23 1414  BP: 104/62 117/67  Pulse: 85 83  Resp: 17 16  Temp:    SpO2: 97% 96%    General: Pt is alert, awake, not in acute distress Cardiovascular: RRR, S1/S2 +, no edema Respiratory: CTA bilaterally, no wheezing, no rhonchi, no respiratory distress, no conversational dyspnea  Abdominal: Soft, NT, ND, bowel sounds + Extremities: no edema, no cyanosis Psych: Normal mood and affect, stable judgement and insight     The results of significant diagnostics from this hospitalization (including imaging, microbiology, ancillary and laboratory) are listed below for reference.     Microbiology: Recent Results (from the past 240 hours)  Resp panel by RT-PCR (RSV, Flu A&B, Covid) Anterior Nasal Swab     Status: None   Collection Time: 07/17/23   8:30 AM   Specimen: Anterior Nasal Swab  Result Value Ref Range Status   SARS Coronavirus 2 by RT PCR NEGATIVE NEGATIVE Final    Comment: (NOTE) SARS-CoV-2 target nucleic acids are NOT DETECTED.  The SARS-CoV-2 RNA is generally detectable in upper respiratory specimens during the acute phase of infection. The lowest concentration of SARS-CoV-2 viral copies this assay can detect is 138 copies/mL. A negative result does not preclude SARS-Cov-2 infection and should not be used as the sole basis for treatment or other patient management decisions. A negative result may occur with  improper specimen collection/handling, submission of specimen other than nasopharyngeal swab, presence of viral mutation(s) within the areas targeted by this assay, and inadequate number of viral copies(<138 copies/mL). A negative result must be combined with clinical observations, patient history, and epidemiological information. The expected result is Negative.  Fact Sheet for Patients:  BloggerCourse.com  Fact Sheet for Healthcare Providers:  SeriousBroker.it  This test is no t yet approved or cleared by the [place]  FDA and  has been authorized for detection and/or diagnosis of SARS-CoV-2 by FDA under an Emergency Use Authorization (EUA). This  EUA will remain  in effect (meaning this test can be used) for the duration of the COVID-19 declaration under Section 564(b)(1) of the Act, 21 U.S.C.section 360bbb-3(b)(1), unless the authorization is terminated  or revoked sooner.       Influenza A by PCR NEGATIVE NEGATIVE Final   Influenza B by PCR NEGATIVE NEGATIVE Final    Comment: (NOTE) The Xpert Xpress SARS-CoV-2/FLU/RSV plus assay is intended as an aid in the diagnosis of influenza from Nasopharyngeal swab specimens and should not be used as a sole basis for treatment. Nasal washings and aspirates are unacceptable for Xpert Xpress  SARS-CoV-2/FLU/RSV testing.  Fact Sheet for Patients: BloggerCourse.com  Fact Sheet for Healthcare Providers: SeriousBroker.it  This test is not yet approved or cleared by the [place]  FDA and has been authorized for detection and/or diagnosis of SARS-CoV-2 by FDA under an Emergency Use Authorization (EUA). This EUA will remain in effect (meaning this test can be used) for the duration of the COVID-19 declaration under Section 564(b)(1) of the Act, 21 U.S.C. section 360bbb-3(b)(1), unless the authorization is terminated or revoked.     Resp Syncytial Virus by PCR NEGATIVE NEGATIVE Final    Comment: (NOTE) Fact Sheet for Patients: BloggerCourse.com  Fact Sheet for Healthcare Providers: SeriousBroker.it  This test is not yet approved or cleared by the [place]  FDA and has been authorized for detection and/or diagnosis of SARS-CoV-2 by FDA under an Emergency Use Authorization (EUA). This EUA will remain in effect (meaning this test can be used) for the duration of the COVID-19 declaration under Section 564(b)(1) of the Act, 21 U.S.C. section 360bbb-3(b)(1), unless the authorization is terminated or revoked.  Performed at [facility], [address] [place], [place] [address]   Culture, blood (routine x 2)     Status: None   Collection Time: 07/18/23 10:09 PM   Specimen: BLOOD  Result Value Ref Range Status   Specimen Description   Final    BLOOD RIGHT ANTECUBITAL Performed at [facility], [address] [place], [place] [address]    Special Requests   Final    BOTTLES DRAWN AEROBIC AND ANAEROBIC Blood Culture results may not be optimal due to an inadequate volume of blood received in culture bottles Performed at [facility], [address] [place], [place] [address]    Culture   Final    NO GROWTH 5 DAYS Performed at [facility], [address] [place], [place] [address]    Report Status 07/24/2023 FINAL  Final  Culture, blood (routine x 2) Call MD if unable to obtain prior to antibiotics being given     Status: None   Collection Time: 07/19/23  6:40 AM   Specimen: BLOOD  Result Value Ref Range Status   Specimen Description   Final    BLOOD BLOOD RIGHT ARM AEROBIC BOTTLE ONLY ANAEROBIC BOTTLE ONLY Performed at [facility], [address], [place], [place] [address]    Special Requests   Final    BOTTLES DRAWN AEROBIC AND ANAEROBIC Blood Culture results may not be optimal due to an inadequate volume of blood received in culture bottles Performed at [facility], [address] [place], [place] [address]    Culture   Final    NO GROWTH 5 DAYS Performed at [facility], [address] [place], [place] [address]    Report Status 07/24/2023 FINAL  Final  Culture, blood (routine x 2) Call MD if unable to obtain prior to antibiotics being given     Status: None   Collection Time: 07/19/23  6:44 AM   Specimen: BLOOD  Result Value Ref Range Status   Specimen Description   Final    BLOOD BLOOD LEFT ARM AEROBIC BOTTLE ONLY ANAEROBIC BOTTLE ONLY Performed at [facility], [address], [place], [place] [address]    Special Requests   Final    BOTTLES DRAWN AEROBIC AND ANAEROBIC Blood Culture results may not be optimal due to an inadequate volume of blood received in culture bottles Performed at [facility], [address] [place], [place] [address]    Culture   Final    NO GROWTH 5 DAYS Performed at [facility], [address], [place], [place] [address]    Report Status 07/24/2023 FINAL  Final  Respiratory (~20 pathogens) panel by PCR     Status: Abnormal   Collection Time: 07/22/23  1:37 PM   Specimen: Nasopharyngeal Swab; Respiratory  Result Value Ref Range Status   Adenovirus NOT DETECTED NOT  DETECTED Final   Coronavirus 229E NOT DETECTED NOT DETECTED Final    Comment: (NOTE) The Coronavirus on the Respiratory Panel, DOES NOT test for the novel  Coronavirus (2019 nCoV)    Coronavirus HKU1 NOT DETECTED NOT DETECTED Final   Coronavirus NL63 NOT DETECTED NOT DETECTED Final   Coronavirus OC43 NOT DETECTED NOT DETECTED Final   Metapneumovirus NOT DETECTED NOT DETECTED Final   Rhinovirus / Enterovirus DETECTED (A) NOT DETECTED Final   Influenza A NOT DETECTED NOT DETECTED Final   Influenza B NOT DETECTED NOT DETECTED Final   Parainfluenza Virus 1 NOT DETECTED NOT DETECTED Final   Parainfluenza Virus 2 NOT DETECTED NOT DETECTED Final   Parainfluenza Virus 3 NOT DETECTED NOT DETECTED Final   Parainfluenza Virus 4 NOT DETECTED NOT DETECTED Final   Respiratory Syncytial Virus NOT DETECTED NOT DETECTED Final   Bordetella pertussis NOT DETECTED NOT DETECTED Final   Bordetella Parapertussis NOT DETECTED NOT DETECTED Final   Chlamydophila pneumoniae NOT DETECTED NOT DETECTED Final   Mycoplasma pneumoniae NOT DETECTED NOT DETECTED Final    Comment: Performed at [facility], [address] [place], [place] [address]  Expectorated Sputum Assessment w Gram Stain, Rflx to Resp Cult     Status: None   Collection Time: 07/22/23  2:37 PM   Specimen: Expectorated Sputum  Result Value Ref Range Status   Specimen Description EXPECTORATED SPUTUM  Final   Special Requests NONE  Final   Sputum evaluation   Final    THIS SPECIMEN IS ACCEPTABLE FOR SPUTUM CULTURE Performed at [facility], [address], [place], [place] [address]    Report Status 07/22/2023 FINAL  Final  Culture, Respiratory w Gram Stain     Status: None   Collection Time: 07/22/23  2:37 PM  Result Value Ref Range Status   Specimen Description   Final    EXPECTORATED SPUTUM Performed at [facility], [address] [place], [place] [address]    Special Requests   Final    NONE  Reflexed from [phone] Performed at [facility], [address], [place], [place] [address]    Gram Stain   Final    FEW WBC PRESENT, PREDOMINANTLY PMN FEW BUDDING YEAST SEEN RARE GRAM POSITIVE COCCI IN CHAINS    Culture   Final  MODERATE Normal respiratory flora-no Staph aureus or Pseudomonas seen Performed at [facility], [address], [place], [place] [address]    Report Status 07/24/2023 FINAL  Final     Labs: BNP (last 3 results) No results for input(s): "BNP" in the last 8760 hours. Basic Metabolic Panel: Recent Labs  Lab 07/20/23 0403 07/20/23 0703 07/20/23 1606 07/21/23 0402 07/22/23 0411 07/23/23 0340 07/23/23 0345 07/23/23 0947 07/23/23 0948 07/24/23 0449  NA 135  --   --  139 141  --  138 141 141 139  K 2.8*  --    < > 3.4* 3.3*  --  3.9 3.8 3.8 3.7  CL 98  --   --  105 105  --  102  --   --  105  CO2 27  --   --  24 26  --  26  --   --  26  GLUCOSE 181*  --   --  92 93  --  115*  --   --  102*  BUN 8  --   --  11 17  --  16  --   --  14  CREATININE 0.44  --   --  0.44 0.51  --  0.61  --   --  0.63  CALCIUM  8.3*  --   --  8.4* 8.7*  --  8.1*  --   --  8.6*  MG  --  1.7  --   --  1.8 2.3  --   --   --  2.0   < > = values in this interval not displayed.   Liver Function Tests: Recent Labs  Lab 07/20/23 0403  AST 16  ALT 37  ALKPHOS 49  BILITOT 0.9  PROT 6.0*  ALBUMIN 2.6*   No results for input(s): "LIPASE", "AMYLASE" in the last 168 hours. No results for input(s): "AMMONIA" in the last 168 hours. CBC: Recent Labs  Lab 07/21/23 0402 07/22/23 0411 07/23/23 0345 07/23/23 0947 07/23/23 0948 07/23/23 1300 07/26/23 1011  WBC 6.8 5.2 6.7  --   --  6.2 6.6  HGB 11.3* 12.6 12.5 12.2 12.6 13.7 11.7*  HCT 37.1 40.4 40.8 36.0 37.0 44.2 39.6  MCV 89.0 86.9 88.3  --   --  88.2 91.9  PLT 331 369 411*  --   --  464* 453*   Cardiac Enzymes: No results for input(s): "CKTOTAL", "CKMB", "CKMBINDEX", "TROPONINI" in the last 168  hours. BNP: Invalid input(s): "POCBNP" CBG: Recent Labs  Lab 07/25/23 1225 07/25/23 1652 07/25/23 2016 07/26/23 0736 07/26/23 1129  GLUCAP 99 79 264* 132* 90   D-Dimer No results for input(s): "DDIMER" in the last 72 hours. Hgb A1c No results for input(s): "HGBA1C" in the last 72 hours. Lipid Profile No results for input(s): "CHOL", "HDL", "LDLCALC", "TRIG", "CHOLHDL", "LDLDIRECT" in the last 72 hours. Thyroid function studies No results for input(s): "TSH", "T4TOTAL", "T3FREE", "THYROIDAB" in the last 72 hours.  Invalid input(s): "FREET3" Anemia work up No results for input(s): "VITAMINB12", "FOLATE", "FERRITIN", "TIBC", "IRON", "RETICCTPCT" in the last 72 hours. Urinalysis No results found for: "COLORURINE", "APPEARANCEUR", "LABSPEC", "PHURINE", "GLUCOSEU", "HGBUR", "BILIRUBINUR", "KETONESUR", "PROTEINUR", "UROBILINOGEN", "NITRITE", "LEUKOCYTESUR" Sepsis Labs Recent Labs  Lab 07/22/23 0411 07/23/23 0345 07/23/23 1300 07/26/23 1011  WBC 5.2 6.7 6.2 6.6   Microbiology Recent Results (from the past 240 hours)  Resp panel by RT-PCR (RSV, Flu A&B, Covid) Anterior Nasal Swab     Status: None  Collection Time: 07/17/23  8:30 AM   Specimen: Anterior Nasal Swab  Result Value Ref Range Status   SARS Coronavirus 2 by RT PCR NEGATIVE NEGATIVE Final    Comment: (NOTE) SARS-CoV-2 target nucleic acids are NOT DETECTED.  The SARS-CoV-2 RNA is generally detectable in upper respiratory specimens during the acute phase of infection. The lowest concentration of SARS-CoV-2 viral copies this assay can detect is 138 copies/mL. A negative result does not preclude SARS-Cov-2 infection and should not be used as the sole basis for treatment or other patient management decisions. A negative result may occur with  improper specimen collection/handling, submission of specimen other than nasopharyngeal swab, presence of viral mutation(s) within the areas targeted by this assay, and  inadequate number of viral copies(<138 copies/mL). A negative result must be combined with clinical observations, patient history, and epidemiological information. The expected result is Negative.  Fact Sheet for Patients:  BloggerCourse.com  Fact Sheet for Healthcare Providers:  SeriousBroker.it  This test is no t yet approved or cleared by the [place]  FDA and  has been authorized for detection and/or diagnosis of SARS-CoV-2 by FDA under an Emergency Use Authorization (EUA). This EUA will remain  in effect (meaning this test can be used) for the duration of the COVID-19 declaration under Section 564(b)(1) of the Act, 21 U.S.C.section 360bbb-3(b)(1), unless the authorization is terminated  or revoked sooner.       Influenza A by PCR NEGATIVE NEGATIVE Final   Influenza B by PCR NEGATIVE NEGATIVE Final    Comment: (NOTE) The Xpert Xpress SARS-CoV-2/FLU/RSV plus assay is intended as an aid in the diagnosis of influenza from Nasopharyngeal swab specimens and should not be used as a sole basis for treatment. Nasal washings and aspirates are unacceptable for Xpert Xpress SARS-CoV-2/FLU/RSV testing.  Fact Sheet for Patients: BloggerCourse.com  Fact Sheet for Healthcare Providers: SeriousBroker.it  This test is not yet approved or cleared by the [place]  FDA and has been authorized for detection and/or diagnosis of SARS-CoV-2 by FDA under an Emergency Use Authorization (EUA). This EUA will remain in effect (meaning this test can be used) for the duration of the COVID-19 declaration under Section 564(b)(1) of the Act, 21 U.S.C. section 360bbb-3(b)(1), unless the authorization is terminated or revoked.     Resp Syncytial Virus by PCR NEGATIVE NEGATIVE Final    Comment: (NOTE) Fact Sheet for Patients: BloggerCourse.com  Fact Sheet for Healthcare  Providers: SeriousBroker.it  This test is not yet approved or cleared by the [place]  FDA and has been authorized for detection and/or diagnosis of SARS-CoV-2 by FDA under an Emergency Use Authorization (EUA). This EUA will remain in effect (meaning this test can be used) for the duration of the COVID-19 declaration under Section 564(b)(1) of the Act, 21 U.S.C. section 360bbb-3(b)(1), unless the authorization is terminated or revoked.  Performed at [facility], [address] [place], [place] [address]   Culture, blood (routine x 2)     Status: None   Collection Time: 07/18/23 10:09 PM   Specimen: BLOOD  Result Value Ref Range Status   Specimen Description   Final    BLOOD RIGHT ANTECUBITAL Performed at [facility], [address] [place], [place] [address]    Special Requests   Final    BOTTLES DRAWN AEROBIC AND ANAEROBIC Blood Culture results may not be optimal due to an inadequate volume of blood received in culture bottles Performed at [facility], [address] [place], [place] [address]    Culture   Final    NO GROWTH 5 DAYS Performed at [facility], [address] [place], [place] [address]    Report Status 07/24/2023 FINAL  Final  Culture, blood (routine x 2) Call MD if unable to obtain prior to antibiotics being given     Status: None   Collection Time: 07/19/23  6:40 AM   Specimen: BLOOD  Result Value Ref Range Status   Specimen Description   Final    BLOOD BLOOD RIGHT ARM AEROBIC BOTTLE ONLY ANAEROBIC BOTTLE ONLY Performed at [facility], [address] [place], [place] [address]    Special Requests   Final    BOTTLES DRAWN AEROBIC AND ANAEROBIC Blood Culture results may not be optimal due to an inadequate volume of blood received in culture bottles Performed at [facility], [address] [place], [place] [address]    Culture   Final    NO  GROWTH 5 DAYS Performed at [facility], [address], [place], [place] [address]    Report Status 07/24/2023 FINAL  Final  Culture, blood (routine x 2) Call MD if unable to obtain prior to antibiotics being given     Status: None   Collection Time: 07/19/23  6:44 AM   Specimen: BLOOD  Result Value Ref Range Status   Specimen Description   Final    BLOOD BLOOD LEFT ARM AEROBIC BOTTLE ONLY ANAEROBIC BOTTLE ONLY Performed at [facility], [address] [place], [place] [address]    Special Requests   Final    BOTTLES DRAWN AEROBIC AND ANAEROBIC Blood Culture results may not be optimal due to an inadequate volume of blood received in culture bottles Performed at [facility], [address] [place], [place] [address]    Culture   Final    NO GROWTH 5 DAYS Performed at [facility], [address], [place], [place] [address]    Report Status 07/24/2023 FINAL  Final  Respiratory (~20 pathogens) panel by PCR     Status: Abnormal   Collection Time: 07/22/23  1:37 PM   Specimen: Nasopharyngeal Swab; Respiratory  Result Value Ref Range Status   Adenovirus NOT DETECTED NOT DETECTED Final   Coronavirus 229E NOT DETECTED NOT DETECTED Final    Comment: (NOTE) The Coronavirus on the Respiratory Panel, DOES NOT test for the novel  Coronavirus (2019 nCoV)    Coronavirus HKU1 NOT DETECTED NOT DETECTED Final   Coronavirus NL63 NOT DETECTED NOT DETECTED Final   Coronavirus OC43 NOT DETECTED NOT DETECTED Final   Metapneumovirus NOT DETECTED NOT DETECTED Final   Rhinovirus / Enterovirus DETECTED (A) NOT DETECTED Final   Influenza A NOT DETECTED NOT DETECTED Final   Influenza B NOT DETECTED NOT DETECTED Final   Parainfluenza Virus 1 NOT DETECTED NOT DETECTED Final   Parainfluenza Virus 2 NOT DETECTED NOT DETECTED Final   Parainfluenza Virus 3 NOT DETECTED NOT DETECTED Final   Parainfluenza Virus 4 NOT DETECTED NOT DETECTED Final   Respiratory  Syncytial Virus NOT DETECTED NOT DETECTED Final   Bordetella pertussis NOT DETECTED NOT DETECTED Final   Bordetella Parapertussis NOT DETECTED NOT DETECTED Final   Chlamydophila pneumoniae NOT DETECTED NOT DETECTED Final   Mycoplasma pneumoniae NOT DETECTED NOT DETECTED Final    Comment: Performed at [facility], [address] [place], [place] [address]  Expectorated Sputum Assessment  w Gram Stain, Rflx to Resp Cult     Status: None   Collection Time: 07/22/23  2:37 PM   Specimen: Expectorated Sputum  Result Value Ref Range Status   Specimen Description EXPECTORATED SPUTUM  Final   Special Requests NONE  Final   Sputum evaluation   Final    THIS SPECIMEN IS ACCEPTABLE FOR SPUTUM CULTURE Performed at [id number], [address], [place], [place] [address]    Report Status 07/22/2023 FINAL  Final  Culture, Respiratory w Gram Stain     Status: None   Collection Time: 07/22/23  2:37 PM  Result Value Ref Range Status   Specimen Description   Final    EXPECTORATED SPUTUM Performed at [facility], [address] [place], [place] [address]    Special Requests   Final    NONE Reflexed from [id number] Performed at [facility], [address], [place], [place] [address]    Gram Stain   Final    FEW WBC PRESENT, PREDOMINANTLY PMN FEW BUDDING YEAST SEEN RARE GRAM POSITIVE COCCI IN CHAINS    Culture   Final    MODERATE Normal respiratory flora-no Staph aureus or Pseudomonas seen Performed at [facility], [address], [place], [place] [address]    Report Status 07/24/2023 FINAL  Final     Patient was seen and examined on the day of discharge and was found to be in stable condition. Time coordinating discharge: 40 minutes including assessment and coordination of care, as well as examination of the patient.   SIGNED:  Daren Eck, DO [facility] 07/26/2023, 3:23 PM

## 2023-07-26 NOTE — Progress Notes (Signed)
 Echocardiogram Echocardiogram Transesophageal has been performed.  Natasha Hudson 07/26/2023, 2:31 PM

## 2023-07-26 NOTE — Plan of Care (Signed)
  Problem: Health Behavior/Discharge Planning: Goal: Ability to manage health-related needs will improve Outcome: Progressing   Problem: Clinical Measurements: Goal: Ability to maintain clinical measurements within normal limits will improve Outcome: Progressing Goal: Diagnostic test results will improve Outcome: Progressing Goal: Cardiovascular complication will be avoided Outcome: Progressing   Problem: Activity: Goal: Risk for activity intolerance will decrease Outcome: Progressing   Problem: Nutrition: Goal: Adequate nutrition will be maintained Outcome: Progressing   Problem: Coping: Goal: Level of anxiety will decrease Outcome: Progressing

## 2023-07-26 NOTE — Op Note (Signed)
 INDICATIONS: Mitral insufficiency  PROCEDURE:   Informed consent was obtained prior to the procedure. The risks, benefits and alternatives for the procedure were discussed and the patient comprehended these risks.  Risks include, but are not limited to, cough, sore throat, vomiting, nausea, somnolence, esophageal and stomach trauma or perforation, bleeding, low blood pressure, aspiration, pneumonia, infection, trauma to the teeth and death.    After a procedural time-out, the oropharynx was anesthetized with 20% benzocaine spray.   During this procedure the patient was administered IV propofol  by Anesthesiology, Dr. Annabell Key  The transesophageal probe was inserted in the esophagus and stomach without difficulty and multiple views were obtained.  The patient was kept under observation until the patient left the procedure room.  The patient left the procedure room in stable condition.   Agitated microbubble saline contrast was not administered.  COMPLICATIONS:    There were no immediate complications.  FINDINGS:  Severely depressed left ventricular function with global hypokinesis and profound septal-lateral dyssynchrony. EF 25%. There is no evidence of LV thrombus or LA thrombus. Moderate-severe mitral insufficiency with a central jet.   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 Morphologicaly amenable to TEER (Mitraclip), but may improve with medical therapy for cardiomyopathy and CRT.  Time Spent Directly with the Patient:  45 minutes   Natasha Hudson 07/26/2023, 1:48 PM

## 2023-07-26 NOTE — Anesthesia Postprocedure Evaluation (Signed)
 Anesthesia Post Note  Patient: Zylpha Poynor  Procedure(s) Performed: TRANSESOPHAGEAL ECHOCARDIOGRAM     Patient location during evaluation: PACU Anesthesia Type: MAC Level of consciousness: awake and alert Pain management: pain level controlled Vital Signs Assessment: post-procedure vital signs reviewed and stable Respiratory status: spontaneous breathing, nonlabored ventilation and respiratory function stable Cardiovascular status: blood pressure returned to baseline and stable Postop Assessment: no apparent nausea or vomiting Anesthetic complications: no   No notable events documented.  Last Vitals:  Vitals:   07/26/23 1404 07/26/23 1414  BP: 104/62 117/67  Pulse: 85 83  Resp: 17 16  Temp:    SpO2: 97% 96%    Last Pain:  Vitals:   07/26/23 1414  TempSrc:   PainSc: 0-No pain                 Earvin Goldberg

## 2023-07-26 NOTE — Progress Notes (Signed)
 Patient Name: Natasha Hudson Date of Encounter: 07/26/2023 [facility] Cardiologist: Olinda Bertrand, DO (NEW)  Interval Summary  .    BP 107/60. Cr 0.63.  Denies any chest pain or dyspnea  Vital Signs .    Vitals:   07/25/23 0657 07/25/23 1012 07/25/23 2018 07/26/23 0449  BP: (!) 146/96 108/68 107/63 107/60  Pulse: 94 97 96 90  Resp:  16 20 20   Temp:  98.6 F (37 C) 98.3 F (36.8 C) 98.5 F (36.9 C)  TempSrc:  Oral Oral Oral  SpO2:  100% 98% 98%  Weight:    63.8 kg  Height:        Intake/Output Summary (Last 24 hours) at 07/26/2023 0846 Last data filed at 07/25/2023 1811 Gross per 24 hour  Intake 480 ml  Output 800 ml  Net -320 ml      07/26/2023    4:49 AM 07/25/2023    4:26 AM 07/24/2023    4:56 AM  Last 3 Weights  Weight (lbs) 140 lb 10.5 oz 132 lb 11.2 oz 132 lb 11.5 oz  Weight (kg) 63.8 kg 60.192 kg 60.2 kg         Telemetry/ECG Normal sinus rhythm - Personally Reviewed  Echo  07/19/2023  1. Left ventricular ejection fraction, by estimation, is 25 to 30%. Left  ventricular ejection fraction by 2D MOD biplane is 27.2 %. The left  ventricle has severely decreased function. The left ventricle has no  regional wall motion abnormalities with  septal-lateral dyssynchrony consistent with LBBB and septal and inferior  wall severe hypokinesis. Indeterminate diastolic filling due to E-A  fusion. There is some concern for a small LV apical thrombus on Definity   images, I cannot find it on the  non-contrast enhanced images. Consider cardiac MRI to investigate further.   2. Right ventricular systolic function is normal. The right ventricular  size is normal. There is mildly elevated pulmonary artery systolic  pressure. The estimated right ventricular systolic pressure is 37.3 mmHg.   3. The mitral valve is abnormal. Moderate to severe mitral valve  regurgitation (severe visually, moderate by PISA ERO 0.21 cm^2). No  evidence of mitral stenosis. The mean  mitral valve gradient is 6.0 mmHg.  Though mean gradient is elevated, visually  valve opens well. Elevated gradient may be due to high flow with  significant mitral regurgitation.   4. The aortic valve is tricuspid. There is mild calcification of the  aortic valve. Aortic valve regurgitation is not visualized. No aortic  stenosis is present.   5. The inferior vena cava is normal in size with greater than 50%  respiratory variability, suggesting right atrial pressure of 3 mmHg.   6. There is a left pleural effusion. a small pericardial effusion is  present. The pericardial effusion is localized near the right atrium.   cMRI 07/22/2023 1. Severe decrease in left ventricular systolic function (LVEF =27%). No LV apical thrombus noted. Moderate dilation.   2. Normal right ventricular systolic function (RVEF =45%).   3. There is a 11 mm X 10 mm well circumscribed mass with attached to a central stalk to the pulmonary ridge. This is concerning for thrombus. Consider TEE evaluation.   4. Qualitatively, there is significant mitral regurgitation. Regurgitant fraction 42%. Suggestive of severe regurgitation.  Left and right heart cath 07/23/2023 1.  Angiographically normal coronary arteries (right dominant) 2.  Right heart catheterization data: Right atrial pressure mean 3 mmHg RV pressure 32 over 3 mmHg PA pressure  35/14 mean 20 mmHg Pulmonary capillary wedge pressure A-wave 14, V wave 15, mean 13 mmHg LVEDP 21 mmHg Cardiac output 5.2 L/min, cardiac index 3.1 L/min/m   Conclusion: Patient with normal coronary arteries and well compensated hemodynamics  EKG: ( No new  Telemetry: (personally reviewed by me) Sinus rhythm    Physical Exam .     General: Age appropriate, hemodynamically stable, no acute distress HEENT: Moist membranes, trachea midline, JVP(improving) Lungs: Clear to auscultation with rales at the bases, no rhonchi's or wheezing Heart: regular, positive S1-S2,  holosystolic murmur heard at the apex no gallops or rubs Abdomen: Soft, nontender, distended, positive bowel sounds all 4 quadrants Extremities: warm to touch, 1+ pitting edema bilaterally Neuro: Alert x 4, nonfocal Psych: Cooperative and pleasant  Assessment & Plan .    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 Hyperlipidemia   Recommendations:  Newly discovered heart failure with reduced EF Nonischemic Cardiomyopathy LBBB Stage C, NYHA class II/III LHC/RHC on 5/16 showed normal coronary arteries, will compensated hemodynamics (RA 3, RV 32/3, PA 35/14/2, PCWP 13, LVEDP 21, CI 3.1) 0 Appears euvolemic, will switch to p.o. Lasix  Cont. Jardiance   cont Losartan  25mg  po qPM Cont Aldactone  12.5mg  po QAM  Primary to look into medication cost coverage for Entresto, Jardiance , etc.  Resp panel positive for Rhinovirus  Will need echo in 3 months to re-evaluate LVEF    Questionable LV thrombus: Noted on 2D echo which lead to cMRI - that confirms no LV thrombus.  However, cmRI notes findings of a mass attached to the pulmonary ridge though atrial thrombus would be unusual in the absence of atrial arrhythmia.  TEE has been arranged for today Continue Lovenox  for now.  After careful review of history and examination, the risks and benefits of transesophageal echocardiogram have been explained including risks of esophageal damage, perforation (1:10,000 risk), bleeding, pharyngeal hematoma as well as other potential complications associated with conscious sedation including aspiration, arrhythmia, respiratory failure and death. Alternatives to treatment were discussed, questions were answered. Patient is willing to proceed.     Poorly controlled diabetes: A1c greater than 15 Management  per primary team   Hypertension: Blood pressures are well-controlled. Currently being diuresed and see above.    Hyperlipidemia: Most recent LDL 96 mg/dL. Given the fact that she is a diabetic would recommend a goal LDL <70 mg/dL.  Started Crestor  20 mg p.o. nightly 07/21/2023 recommend 6-week follow-up fasting lipids and CMP to evaluate LFTs.     Wendie Hamburg, MD [place]  [facility]  Pager: [phone] Office: [phone] 07/26/2023 8:46 AM

## 2023-08-06 ENCOUNTER — Ambulatory Visit: Admitting: Student

## 2023-08-09 LAB — HM DIABETES EYE EXAM

## 2023-08-12 NOTE — Progress Notes (Deleted)
 Cardiology Office Note    Patient Name: Natasha Hudson Date of Encounter: 08/12/2023  Primary Care Provider:  Jackqueline Mason, NP Primary Cardiologist:  Olinda Bertrand, DO Primary Electrophysiologist: None   Past Medical History    Past Medical History:  Diagnosis Date   Diabetes mellitus without complication (HCC)    Hypertension     History of Present Illness  Natasha Hudson is a 58 y.o. female with a PMH of HFrEF, DM type II, HTN, HLD, GERD LBBB, NICM, nonrheumatic MR who presents today for posthospital follow-up.    Natasha Hudson presented to the ED on 07/17/2023 at [facility] with complaint of shortness of breath.  She endorsed symptoms for 2 weeks that were worst over the last few days.  She had a chest x-ray completed that showed atypical pneumonia and was discharged with antibiotics.  She presented back to the ED on 07/18/2023 with orthopnea and cough.  She was satting 86% on room air requiring O2 with crackles on exam.  She tested positive for rhinovirus she was admitted for further evaluation with CTA negative for PE but showing mild multifocal bilateral upper lobe infiltrates with large bilateral pleural effusions.  2D echo was completed showing EF of 25 to 30% with RWMA including septal lateral dyssynchrony consistent with LBBB and septal and inferior wall severe hypokinesis with concern for small LV apical thrombus with moderate to severe MR.  She completed cardiac MR that showed severe decreased LV function with circumscribed mass attached to the central stalk of pulmonary ridge concerning for thrombus.  She completed R/LHC that showed angiographically normal coronaries with compensated hemodynamics.  TEE was performed on 07/26/2023 showing severely depressed LV function with profound septal lateral dyssynchrony and EF of 25% with no evidence of LV thrombus and moderate to severe mitral insufficiency.  She was discharged with GDMT consisting of Jardiance , losartan ,  spironolactone , Lasix   Patient denies chest pain, palpitations, dyspnea, PND, orthopnea, nausea, vomiting, dizziness, syncope, edema, weight gain, or early satiety.   Discussed the use of AI scribe software for clinical note transcription with the patient, who gave verbal consent to proceed.  History of Present Illness    ***Notes:   Review of Systems  Please see the history of present illness.    All other systems reviewed and are otherwise negative except as noted above.  Physical Exam     Wt Readings from Last 3 Encounters:  07/26/23 140 lb 10.5 oz (63.8 kg)  07/17/23 155 lb (70.3 kg)  08/13/20 150 lb (68 kg)   NW:GNFAO were no vitals filed for this visit.,There is no height or weight on file to calculate BMI. GEN: Well nourished, well developed in no acute distress Neck: No JVD; No carotid bruits Pulmonary: Clear to auscultation without rales, wheezing or rhonchi  Cardiovascular: Normal rate. Regular rhythm. Normal S1. Normal S2.   Murmurs: There is no murmur.  ABDOMEN: Soft, non-tender, non-distended EXTREMITIES:  No edema; No deformity   EKG/LABS/ Recent Cardiac Studies   ECG personally reviewed by me today - ***  Risk Assessment/Calculations:   {Does this patient have ATRIAL FIBRILLATION?:[phone]}      Lab Results  Component Value Date   WBC 6.6 07/26/2023   HGB 11.7 (L) 07/26/2023   HCT 39.6 07/26/2023   MCV 91.9 07/26/2023   PLT 453 (H) 07/26/2023   Lab Results  Component Value Date   CREATININE 0.63 07/24/2023   BUN 14 07/24/2023   NA 139 07/24/2023   K 3.7  07/24/2023   CL 105 07/24/2023   CO2 26 07/24/2023   Lab Results  Component Value Date   CHOL 152 07/20/2023   HDL 41 07/20/2023   LDLCALC 96 07/20/2023   TRIG 73 07/20/2023   CHOLHDL 3.7 07/20/2023    Lab Results  Component Value Date   HGBA1C 15.4 (H) 07/19/2023   Assessment & Plan    Assessment and Plan Assessment & Plan     1.  HFrEF/NICM  2.  Nonobstructive  CAD  3.  LBBB  4.  Hyperlipidemia  5.  Essential hypertension      Disposition: Follow-up with Sunit Tolia, DO or APP in *** months {Are you ordering a CV Procedure (e.g. stress test, cath, DCCV, TEE, etc)?   Press F2        :[id number]}   Signed, Francene Ing, Retha Cast, NP 08/12/2023, 6:27 PM [facility]

## 2023-08-13 ENCOUNTER — Ambulatory Visit: Attending: Nurse Practitioner | Admitting: Nurse Practitioner

## 2023-08-13 DIAGNOSIS — I447 Left bundle-branch block, unspecified: Secondary | ICD-10-CM

## 2023-08-13 DIAGNOSIS — I2510 Atherosclerotic heart disease of native coronary artery without angina pectoris: Secondary | ICD-10-CM

## 2023-08-13 DIAGNOSIS — E785 Hyperlipidemia, unspecified: Secondary | ICD-10-CM

## 2023-08-13 DIAGNOSIS — I428 Other cardiomyopathies: Secondary | ICD-10-CM

## 2023-08-13 DIAGNOSIS — I5020 Unspecified systolic (congestive) heart failure: Secondary | ICD-10-CM

## 2023-08-19 NOTE — Progress Notes (Signed)
 Subjective:     Patient ID: Natasha Hudson, female    DOB: 10/17/1965, 58 y.o.   MRN: [id number]  Chief Complaint  Patient presents with   Establish Care        follow up urgent care visit    Was dx with flu on 6/10.  Now has been hoarse    HPI Natasha Hudson is a-year-old female who presents to establish care. She is not married and has 4 children. Currently Lives with daughter in [place]. 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  Recently treated at ED 07/2023 for atypical PNA. Urgent Care 3 days ago- Influenza  A positive.  By urgent care 3 days ago diagnosed with Influenza A, atypical PNA 07/2023. Education: [employer]  Records Obtained:  In process  Prior PCP: [facility]- Dr. Pearle Bottoms  FH: Denies Family Hx CVA, Denies Thyroid Dz, Colon Ca, Breast Ca  ETOH:  Denies  Drug Use: Denies Smoking:  NO    Type 2 Diabetes Empagliflozin  (Jardiance ) 10 mg daily Insulin  Aspart (NovoLog ) Insulin  glargine 15 u compliant with medications  Hypertension Lasix  20 mg Spironolactone  disease (Aldactone ) 12.5 mg daily losartan  (Cozaar ) 25 mg at bedtime Compliant with medications  Hyperlipidemia Rosuvastatin  (Crestor ) 20 mg daily Lab Results  Component Value Date   CHOL 152 07/20/2023   HDL 41 07/20/2023   LDLCALC 96 07/20/2023   TRIG 73 07/20/2023   CHOLHDL 3.7 07/20/2023    CHF Recent Echo 08/2023: LVEF- 30%   Vision: Seen by ophthalmology  Allergies:  metformin  Additional C/O Increased nasal congestion occurring over the past week. Clear/ watery. +sneezing. Denies SOB, fever, ear pain, sinus pressure. Patient denies history of allergies.  Patient denies fever, chills, SOB, CP, palpitations, dyspnea, edema, HA, vision changes, N/V/D, abdominal pain, urinary symptoms, rash, weight changes. History of Present Illness              Health Maintenance Due  Topic Date Due   FOOT EXAM  Never  done   Diabetic kidney evaluation - Urine ACR  Never done   Hepatitis C Screening  Never done   Colonoscopy  Never done   MAMMOGRAM  Never done    Past Medical History:  Diagnosis Date   Diabetes mellitus without complication (HCC)    Hypertension     Past Surgical History:  Procedure Laterality Date   RIGHT/LEFT HEART CATH AND CORONARY ANGIOGRAPHY N/A 07/23/2023   Procedure: RIGHT/LEFT HEART CATH AND CORONARY ANGIOGRAPHY;  Surgeon: Arnoldo Lapping, MD;  Location: [facility] INVASIVE CV LAB;  Service: Cardiovascular;  Laterality: N/A;   TRANSESOPHAGEAL ECHOCARDIOGRAM (CATH LAB) N/A 07/26/2023   Procedure: TRANSESOPHAGEAL ECHOCARDIOGRAM;  Surgeon: Luana Rumple, MD;  Location: [facility] INVASIVE CV LAB;  Service: Cardiovascular;  Laterality: N/A;    Family History  Problem Relation Age of Onset   Hypertension Mother    Diabetes Mother    Diabetes Sister    Diabetes Brother    Diabetes Other    Hypertension Other     Social History   Socioeconomic History   Marital status: Single    Spouse name: Not on file   Number of children: Not on file   Years of education: Not on file   Highest education level: Not on file  Occupational History   Not on file  Tobacco Use   Smoking status: Never   Smokeless tobacco: Never  Vaping Use   Vaping status: Never Used  Substance and Sexual Activity   Alcohol use: No   Drug use: No   Sexual activity: Not on file  Other Topics Concern   Not on file  Social History Narrative   Not on file   Social Drivers of Health   Financial Resource Strain: Not on file  Food Insecurity: No Food Insecurity (07/19/2023)   Hunger Vital Sign    Worried About Running Out of Food in the Last Year: Never true    Ran Out of Food in the Last Year: Never true  Transportation Needs: No Transportation Needs (07/19/2023)   PRAPARE - Administrator, Civil Service (Medical): No    Lack of Transportation (Non-Medical): No  Physical Activity: Not on file   Stress: Not on file  Social Connections: Unknown (07/19/2023)   Social Connection and Isolation Panel    Frequency of Communication with Friends and Family: Three times a week    Frequency of Social Gatherings with Friends and Family: Three times a week    Attends Religious Services: 1 to 4 times per year    Active Member of Clubs or Organizations: No    Attends Banker Meetings: Never    Marital Status: Patient declined  Intimate Partner Violence: Not At Risk (07/19/2023)   Humiliation, Afraid, Rape, and Kick questionnaire    Fear of Current or Ex-Partner: No    Emotionally Abused: No    Physically Abused: No    Sexually Abused: No    Outpatient Medications Prior to Visit  Medication Sig Dispense Refill   Blood Glucose Monitoring Suppl (BLOOD GLUCOSE MONITOR SYSTEM) w/Device KIT Use to check blood sugar as directed 1 kit 0   empagliflozin  (JARDIANCE ) 10 MG TABS tablet Take 1 tablet (10 mg total) by mouth daily. 90 tablet 0   furosemide  (LASIX ) 20 MG tablet Take 1 tablet (20 mg total) by mouth daily. 90 tablet 0   Glucose Blood (BLOOD GLUCOSE TEST STRIPS) STRP Use to check blood sugar three times daily 100 strip 0   insulin  aspart (NOVOLOG ) 100 UNIT/ML FlexPen Inject 0-6 Units into the skin 3 (three) times daily with meals. Check Blood Glucose (BG) and inject per scale: BG <150= 0 unit; BG 150-200= 1 unit; BG 201-250= 2 unit; BG 251-300= 3 unit; BG 301-350= 4 unit; BG 351-400= 5 unit; BG >400= 6 unit and Call Primary Care. 15 mL 0   insulin  glargine-yfgn (SEMGLEE ) 100 UNIT/ML Pen Inject 15 Units into the skin daily. 15 mL 0   Insulin  Pen Needle (PEN NEEDLES) 31G X 5 MM MISC Use to inject insulin  3 times daily 100 each 0   Lancet Device MISC 1 each by Does not apply route 3 (three) times daily. May dispense any manufacturer covered by patient's insurance. 1 each 0   Lancets MISC Use to check blood sugar three times daily 100 each 0   losartan  (COZAAR ) 25 MG tablet Take 1  tablet (25 mg total) by mouth daily at 10 pm. 90 tablet 0   rosuvastatin  (CRESTOR ) 20 MG tablet Take 1 tablet (20 mg total) by mouth daily. 90 tablet 0   spironolactone  (ALDACTONE ) 25 MG tablet Take 0.5 tablets (12.5 mg total) by mouth every morning. 45 tablet 0   No facility-administered medications prior to visit.    Allergies  Allergen Reactions   Metformin Diarrhea    Only a high dose    ROS See HPI    Objective:    Physical Exam  General: No  acute distress. Awake and conversant.  Eyes: Normal conjunctiva, anicteric. Round symmetric pupils.  ENT: Hearing grossly intact. +rhinorrhea- clear   Neck: Neck is supple. No masses or thyromegaly.  Respiratory: CTAB. Respirations are non-labored. No wheezing.  Skin: Warm. No rashes or ulcers.  Psych: Alert and oriented. Cooperative, Appropriate mood and affect, Normal judgment.  CV: RRR. + Murmur. No lower extremity edema.  MSK: Normal ambulation. No clubbing or cyanosis.     BP (!) 142/82 (BP Location: Right Arm, Patient Position: Sitting, Cuff Size: Normal)   Pulse 97   Temp 98.2 F (36.8 C) (Oral)   Resp 12   Ht 5' 5 (1.651 m)   Wt 141 lb (64 kg)   LMP 10/03/2012   SpO2 96%   BMI 23.46 kg/m  Wt Readings from Last 3 Encounters:  08/20/23 141 lb (64 kg)  07/26/23 140 lb 10.5 oz (63.8 kg)  07/17/23 155 lb (70.3 kg)       Assessment & Plan:   Problem List Items Addressed This Visit     Acute HFrEF (heart failure with reduced ejection fraction) (HCC) - Primary   Recent echo 30% EF.  Referral placed to cardiology.      Relevant Orders   Ambulatory referral to Cardiology   Allergies   Patient has increased rhinorrhea clear for last week Flonase 1 spray daily sent to pharmacy.      Relevant Medications   fluticasone (FLONASE) 50 MCG/ACT nasal spray   Chronic laryngitis   Per prior PCP patient has chronic hoarseness and lesion on true vocal cord      Essential hypertension   BP Readings from Last 1  Encounters:  08/20/23 (!) 142/82    Currently taking Lasix  20 mg daily, losartan  25 mg daily, spironolactone  12.5 mg daily in the morning.  Blood pressure not controlled.  Advised patient to monitor blood pressure at home and return to clinic next week for blood pressure recheck.      Type 2 diabetes mellitus with hyperglycemia (HCC)   Lab Results  Component Value Date   HGBA1C 15.4 (H) 07/19/2023   Patient currently on Jardiance  10 mg daily, insulin  aspart 3 times daily, insulin  glargine 15 units daily.  She recently started all of these medications in May.  History of noncompliance per her PCP note.  She reports taking these medications.  Encouraged patient to write down blood sugar readings and bring to FU appointment next week.       I am having Natasha Hudson start on fluticasone. I am also having her maintain her furosemide , losartan , rosuvastatin , spironolactone , empagliflozin , insulin  glargine-yfgn, insulin  aspart, Blood Glucose Monitor System, BLOOD GLUCOSE TEST STRIPS, Lancet Device, Lancets, and Pen Needles.  Meds ordered this encounter  Medications   fluticasone (FLONASE) 50 MCG/ACT nasal spray    Sig: Place 1 spray into both nostrils daily.    Dispense:  16 g    Refill:  1    Supervising Provider:   Randie Bustle A [[id number]]

## 2023-08-20 ENCOUNTER — Ambulatory Visit: Admitting: Student

## 2023-08-20 ENCOUNTER — Encounter: Payer: Self-pay | Admitting: Student

## 2023-08-20 ENCOUNTER — Encounter: Payer: Self-pay | Admitting: *Deleted

## 2023-08-20 VITALS — BP 142/82 | HR 97 | Temp 98.2°F | Resp 12 | Ht 65.0 in | Wt 141.0 lb

## 2023-08-20 DIAGNOSIS — I10 Essential (primary) hypertension: Secondary | ICD-10-CM | POA: Diagnosis not present

## 2023-08-20 DIAGNOSIS — J370 Chronic laryngitis: Secondary | ICD-10-CM | POA: Insufficient documentation

## 2023-08-20 DIAGNOSIS — I5021 Acute systolic (congestive) heart failure: Secondary | ICD-10-CM | POA: Diagnosis not present

## 2023-08-20 DIAGNOSIS — Z794 Long term (current) use of insulin: Secondary | ICD-10-CM

## 2023-08-20 DIAGNOSIS — E1165 Type 2 diabetes mellitus with hyperglycemia: Secondary | ICD-10-CM | POA: Insufficient documentation

## 2023-08-20 DIAGNOSIS — Z7984 Long term (current) use of oral hypoglycemic drugs: Secondary | ICD-10-CM

## 2023-08-20 DIAGNOSIS — T7840XA Allergy, unspecified, initial encounter: Secondary | ICD-10-CM | POA: Insufficient documentation

## 2023-08-20 MED ORDER — FLUTICASONE PROPIONATE 50 MCG/ACT NA SUSP: 1.0000 | Freq: Every day | NASAL | 1 refills | Status: DC

## 2023-08-20 NOTE — Assessment & Plan Note (Signed)
 Patient has increased rhinorrhea clear for last week Flonase 1 spray daily sent to pharmacy.

## 2023-08-20 NOTE — Patient Instructions (Signed)
 Thank you for choosing [facility] at [facility] for your Primary Care needs. I am excited for the opportunity to partner with you to meet your health care goals. It was a pleasure meeting you today!  Information on diet, exercise, and health maintenance recommendations are listed below. This is information to help you be sure you are on track for optimal health and monitoring.   Please look over this and let us  know if you have any questions or if you have completed any of the health maintenance outside of [facility] so that we can be sure your records are up to date.  ___________________________________________________________  MyChart:  For all urgent or time sensitive needs we ask that you please call the office to avoid delays. Our number is [phone]. MyChart is not constantly monitored and due to the large volume of messages a day, replies may take up to 72 business hours.  MyChart Policy: MyChart allows for you to see your visit notes, after visit summary, provider recommendations, lab and tests results, make an appointment, request refills, and contact your provider or the office for non-urgent questions or concerns. Providers are seeing patients during normal business hours and do not have built in time to review MyChart messages.  We ask that you allow a minimum of 3 business days for responses to [employer]. For this reason, please do not send urgent requests through MyChart. Please call the office at [phone]. New and ongoing conditions may require a visit. We have virtual and in-person visits available for your convenience.  Complex MyChart concerns may require a visit. Your provider may request you schedule a virtual or in-person visit to ensure we are providing the best care possible. MyChart messages sent after 11:00 AM on Friday may not be received by the provider until Monday morning.    Lab and Test Results: You will receive your lab and test  results on MyChart as soon as they are completed and results have been sent by the lab or testing facility. Due to this service, you will receive your results BEFORE your provider.  I review lab and test results each morning prior to seeing patients. Some results require collaboration with other providers to ensure you are receiving the most appropriate care. For this reason, we ask that you please allow a minimum of 3-5 business days from the time that ALL results have been received for your provider to receive and review lab and test results and contact you about these.  Most lab and test result comments from the provider will be sent through MyChart. Your provider may recommend changes to the plan of care, follow-up visits, repeat testing, ask questions, or request an office visit to discuss these results. You may reply directly to this message or call the office to provide information for the provider or set up an appointment. In some instances, you will be called with test results and recommendations. Please let us  know if this is preferred and we will make note of this in your chart to provide this for you.    If you have not heard a response to your lab or test results in 5 business days from all results returning to MyChart, please call the office to let us  know. We ask that you please avoid calling prior to this time unless there is an emergent concern. Due to high call volumes, this can delay the resulting process.  After Hours: For all non-emergency after hours needs, please  call the office at [phone] and select the option to reach the on-call  service. On-call services are shared between multiple [facility] offices and therefore it will not be possible to speak directly with your provider. On-call providers may provide medical advice and recommendations, but are unable to provide refills for maintenance medications.  For all emergency or urgent medical needs after normal business hours, we  recommend that you seek care at the closest Urgent Care or Emergency Department to ensure appropriate treatment in a timely manner.  [facility] has a 24 hour emergency room located on the ground floor for your convenience.   Urgent Concerns During the Business Day Providers are seeing patients from 8AM to 5PM with a busy schedule and are most often not able to respond to non-urgent calls until the end of the day or the next business day. If you should have URGENT concerns during the day, please call and speak to the nurse or schedule a same day appointment so that we can address your concern without delay.   Thank you, again, for choosing me as your health care partner. I appreciate your trust and look forward to learning more about you!   Ica Daye L. Korene Pert, DNP, AGNP-C ___________________________________________________________  Health Maintenance Recommendations Screening Testing Mammogram Every 1-2 years based on history and risk factors Starting at age 7 Pap Smear Ages 21-39 every 3 years Ages 64-65 every 5 years with HPV testing More frequent testing may be required based on results and history Colon Cancer Screening Every 1-10 years based on test performed, risk factors, and history Starting at age 86 Bone Density Screening Every 2-10 years based on history Starting at age 50 for women Recommendations for men differ based on medication usage, history, and risk factors AAA Screening One time ultrasound Men 1-57 years old who have ever smoked Lung Cancer Screening Low Dose Lung CT every 12 months Age 27-80 years with a 20 pack-year smoking history who still smoke or who have quit within the last 15 years  Screening Labs Routine  Labs: Complete Blood Count (CBC), Complete Metabolic Panel (CMP), Cholesterol (Lipid Panel) Every 6-12 months based on history and medications May be recommended more frequently based on current conditions or previous  results Hemoglobin A1c Lab Every 3-12 months based on history and previous results Starting at age 29 or earlier with diagnosis of diabetes, high cholesterol, BMI >26, and/or risk factors Frequent monitoring for patients with diabetes to ensure blood sugar control Thyroid  Panel  Every 6 months based on history, symptoms, and risk factors May be repeated more often if on medication HIV One time testing for all patients 88 and older May be repeated more frequently for patients with increased risk factors or exposure Hepatitis C One time testing for all patients 78 and older May be repeated more frequently for patients with increased risk factors or exposure Gonorrhea, Chlamydia Every 12 months for all sexually active persons 13-24 years Additional monitoring may be recommended for those who are considered high risk or who have symptoms PSA Men 66-41 years old with risk factors Additional screening may be recommended from age 36-69 based on risk factors, symptoms, and history  Vaccine Recommendations Tetanus Booster All adults every 10 years Flu Vaccine All patients 6 months and older every year COVID Vaccine All patients 12 years and older Initial dosing with booster May recommend additional booster based on age and health history HPV Vaccine 2 doses all patients age 35-26 Dosing may be considered for  patients over 26 Shingles Vaccine (Shingrix) 2 doses all adults 50 years and older Pneumonia (Pneumovax 23) All adults 65 years and older May recommend earlier dosing based on health history Pneumonia (Prevnar 18) All adults 65 years and older Dosed 1 year after Pneumovax 23 Pneumonia (Prevnar 20) All adults 65 years and older (adults 19-64 with certain conditions or risk factors) 1 dose  For those who have not received Prevnar 13 vaccine previously   Additional Screening, Testing, and Vaccinations may be recommended on an individualized basis based on family history, health  history, risk factors, and/or exposure.  __________________________________________________________  Diet Recommendations for All Patients  I recommend that all patients maintain a diet low in saturated fats, carbohydrates, and cholesterol. While this can be challenging at first, it is not impossible and small changes can make big differences.  Things to try: Decreasing the amount of soda, sweet tea, and/or juice to one or less per day and replace with water While water is always the first choice, if you do not like water you may consider adding a water additive without sugar to improve the taste other sugar free drinks Replace potatoes with a brightly colored vegetable  Use healthy oils, such as canola oil or olive oil, instead of butter or hard margarine Limit your bread intake to two pieces or less a day Replace regular pasta with low carb pasta options Bake, broil, or grill foods instead of frying Monitor portion sizes  Eat smaller, more frequent meals throughout the day instead of large meals  An important thing to remember is, if you love foods that are not great for your health, you don't have to give them up completely. Instead, allow these foods to be a reward when you have done well. Allowing yourself to still have special treats every once in a while is a nice way to tell yourself thank you for working hard to keep yourself healthy.   Also remember that every day is a new day. If you have a bad day and fall off the wagon, you can still climb right back up and keep moving along on your journey!  We have resources available to help you!  Some websites that may be helpful include: www.http://www.wall-moore.info/  Www.VeryWellFit.com _____________________________________________________________  Activity Recommendations for All Patients  I recommend that all adults get at least 30 minutes of moderate physical activity that elevates your heart rate at least 5 days out of the week.  Some  examples include: Walking or jogging at a pace that allows you to carry on a conversation Cycling (stationary bike or outdoors) Water aerobics Yoga Weight lifting Dancing If physical limitations prevent you from putting stress on your joints, exercise in a pool or seated in a chair are excellent options.  Do determine your MAXIMUM heart rate for activity: 220 - YOUR AGE = MAX Heart Rate   Remember! Do not push yourself too hard.  Start slowly and build up your pace, speed, weight, time in exercise, etc.  Allow your body to rest between exercise and get good sleep. You will need more water than normal when you are exerting yourself. Do not wait until you are thirsty to drink. Drink with a purpose of getting in at least 8, 8 ounce glasses of water a day plus more depending on how much you exercise and sweat.    If you begin to develop dizziness, chest pain, abdominal pain, jaw pain, shortness of breath, headache, vision changes, lightheadedness, or other concerning symptoms, stop  the activity and allow your body to rest. If your symptoms are severe, seek emergency evaluation immediately. If your symptoms are concerning, but not severe, please let us  know so that we can recommend further evaluation.

## 2023-08-20 NOTE — Assessment & Plan Note (Addendum)
 Recent echo 30% EF.  Referral placed to cardiology.

## 2023-08-20 NOTE — Assessment & Plan Note (Signed)
 BP Readings from Last 1 Encounters:  08/20/23 (!) 142/82    Currently taking Lasix  20 mg daily, losartan  25 mg daily, spironolactone  12.5 mg daily in the morning.  Blood pressure not controlled.  Advised patient to monitor blood pressure at home and return to clinic next week for blood pressure recheck.

## 2023-08-20 NOTE — Assessment & Plan Note (Signed)
 Lab Results  Component Value Date   HGBA1C 15.4 (H) 07/19/2023   Patient currently on Jardiance  10 mg daily, insulin  aspart 3 times daily, insulin  glargine 15 units daily.  She recently started all of these medications in May.  History of noncompliance per her PCP note.  She reports taking these medications.  Encouraged patient to write down blood sugar readings and bring to FU appointment next week.

## 2023-08-20 NOTE — Assessment & Plan Note (Signed)
 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

## 2023-08-26 DIAGNOSIS — I10 Essential (primary) hypertension: Secondary | ICD-10-CM | POA: Insufficient documentation

## 2023-08-26 NOTE — Assessment & Plan Note (Signed)
 Recent echo 30% EF.  Encouraged patient to participate in cardiology refferal

## 2023-08-26 NOTE — Assessment & Plan Note (Signed)
 Well controlled, no changes to meds.   Encouraged heart healthy diet such as the DASH diet and exercise as tolerated.      - BP goal <130/80 - monitor and log blood pressures at home - check around the same time each day in a relaxed setting - Limit salt to <2000 mg/day - Follow DASH eating plan (heart healthy diet) - limit alcohol to 2 standard drinks per day for men and 1 per day for women - avoid tobacco products - get at least 2 hours of regular aerobic exercise weekly Patient aware of signs/symptoms requiring further/urgent evaluation.

## 2023-08-26 NOTE — Assessment & Plan Note (Addendum)
 Lab Results  Component Value Date   HGBA1C 15.4 (H) 07/19/2023  Referral sent to endocrinology for further evaluation. Patient currently on Jardiance  10 mg daily, insulin  aspart 3 times daily, insulin  glargine 15 units daily.  She was recently started on these medications in May 2025 following ED visit.  Adjust your long-acting insulin  glargine (SEMGLEE ) by increasing the dose by 2 units q 2 days if your fasting morning blood sugar is consistently over 130 mg/dL. If your fasting blood sugar falls below 80 mg/dL, reduce the dose by 2 units.

## 2023-08-26 NOTE — Progress Notes (Deleted)
 Subjective:     Patient ID: Natasha Hudson, female    DOB: 1966/01/05, 58 y.o.   MRN: [id number]  No chief complaint on file.   HPI Natasha Hudson presents for follow-up chronic conditions.  Type 2 Diabetes Empagliflozin  (Jardiance ) 10 mg daily Insulin  Aspart (NovoLog ) Insulin  glargine 15 u compliant with medications  Patient reports fasting BS: *** Denies hypoglycemic events, polyuria, polydipsia, or polyphagia. Denies unexplained weight changes, visual changes, or neuropathy symptoms. Denies foot ulcers, wounds, or pain  Hypertension Lasix  20 mg Spironolactone  disease (Aldactone ) 12.5 mg daily losartan  (Cozaar ) 25 mg at bedtime Compliant with medications BP at home *** ***   BP Readings from Last 1 Encounters:  08/20/23 (!) 142/82    Allergy Fluticasone  nasal spray Compliant  CHF Recent Echo 08/2023: LVEF- 25-30%.  She was referred to cardiology- no show for June 6th appointment.  Hyperlipidemia Rosuvastatin  (Crestor ) 20 mg daily, compliant Lab Results  Component Value Date   CHOL 152 07/20/2023   HDL 41 07/20/2023   LDLCALC 96 07/20/2023   TRIG 73 07/20/2023   CHOLHDL 3.7 07/20/2023     Vision: Seen by ophthalmology, upcoming appointment   Allergies:  metformin  Additional C/O   Patient denies fever, chills, SOB, CP, palpitations, dyspnea, edema, HA, vision changes, N/V/D, abdominal pain, urinary symptoms, rash, weight changes. History of Present Illness              Health Maintenance Due  Topic Date Due  . FOOT EXAM  Never done  . Diabetic kidney evaluation - Urine ACR  Never done  . Hepatitis C Screening  Never done  . Colonoscopy  Never done  . MAMMOGRAM  Never done    Past Medical History:  Diagnosis Date  . Diabetes mellitus without complication (HCC)   . Hypertension     Past Surgical History:  Procedure Laterality Date  . RIGHT/LEFT HEART CATH AND CORONARY ANGIOGRAPHY N/A 07/23/2023   Procedure: RIGHT/LEFT HEART CATH AND  CORONARY ANGIOGRAPHY;  Surgeon: Wonda Sharper, MD;  [facility] INVASIVE CV LAB;  Service: Cardiovascular;  Laterality: N/A;  . TRANSESOPHAGEAL ECHOCARDIOGRAM (CATH LAB) N/A 07/26/2023   Procedure: TRANSESOPHAGEAL ECHOCARDIOGRAM;  Surgeon: Francyne Headland, MD;  Location: [facility] INVASIVE CV LAB;  Service: Cardiovascular;  Laterality: N/A;    Family History  Problem Relation Age of Onset  . Hypertension Mother   . Diabetes Mother   . Diabetes Sister   . Diabetes Brother   . Diabetes Other   . Hypertension Other     Social History   Socioeconomic History  . Marital status: Single    Spouse name: Not on file  . Number of children: Not on file  . Years of education: Not on file  . Highest education level: Not on file  Occupational History  . Not on file  Tobacco Use  . Smoking status: Never  . Smokeless tobacco: Never  Vaping Use  . Vaping status: Never Used  Substance and Sexual Activity  . Alcohol use: No  . Drug use: No  . Sexual activity: Not on file  Other Topics Concern  . Not on file  Social History Narrative  . Not on file   Social Drivers of Health   Financial Resource Strain: Not on file  Food Insecurity: No Food Insecurity (07/19/2023)   Hunger Vital Sign   . Worried About Programme researcher, broadcasting/film/video in the Last Year: Never true   . Ran Out of Food in the Last Year: Never true  Transportation Needs: No Transportation Needs (07/19/2023)   PRAPARE - Transportation   . Lack of Transportation (Medical): No   . Lack of Transportation (Non-Medical): No  Physical Activity: Not on file  Stress: Not on file  Social Connections: Unknown (07/19/2023)   Social Connection and Isolation Panel   . Frequency of Communication with Friends and Family: Three times a week   . Frequency of Social Gatherings with Friends and Family: Three times a week   . Attends Religious Services: 1 to 4 times per year   . Active Member of Clubs or Organizations: No   . Attends Banker  Meetings: Never   . Marital Status: Patient declined  Intimate Partner Violence: Not At Risk (07/19/2023)   Humiliation, Afraid, Rape, and Kick questionnaire   . Fear of Current or Ex-Partner: No   . Emotionally Abused: No   . Physically Abused: No   . Sexually Abused: No    Outpatient Medications Prior to Visit  Medication Sig Dispense Refill  . Blood Glucose Monitoring Suppl (BLOOD GLUCOSE MONITOR SYSTEM) w/Device KIT Use to check blood sugar as directed 1 kit 0  . empagliflozin  (JARDIANCE ) 10 MG TABS tablet Take 1 tablet (10 mg total) by mouth daily. 90 tablet 0  . fluticasone  (FLONASE ) 50 MCG/ACT nasal spray Place 1 spray into both nostrils daily. 16 g 1  . furosemide  (LASIX ) 20 MG tablet Take 1 tablet (20 mg total) by mouth daily. 90 tablet 0  . Glucose Blood (BLOOD GLUCOSE TEST STRIPS) STRP Use to check blood sugar three times daily 100 strip 0  . insulin  aspart (NOVOLOG ) 100 UNIT/ML FlexPen Inject 0-6 Units into the skin 3 (three) times daily with meals. Check Blood Glucose (BG) and inject per scale: BG <150= 0 unit; BG 150-200= 1 unit; BG 201-250= 2 unit; BG 251-300= 3 unit; BG 301-350= 4 unit; BG 351-400= 5 unit; BG >400= 6 unit and Call Primary Care. 15 mL 0  . insulin  glargine-yfgn (SEMGLEE ) 100 UNIT/ML Pen Inject 15 Units into the skin daily. 15 mL 0  . Insulin  Pen Needle (PEN NEEDLES) 31G X 5 MM MISC Use to inject insulin  3 times daily 100 each 0  . Lancet Device MISC 1 each by Does not apply route 3 (three) times daily. May dispense any manufacturer covered by patient's insurance. 1 each 0  . Lancets MISC Use to check blood sugar three times daily 100 each 0  . losartan  (COZAAR ) 25 MG tablet Take 1 tablet (25 mg total) by mouth daily at 10 pm. 90 tablet 0  . rosuvastatin  (CRESTOR ) 20 MG tablet Take 1 tablet (20 mg total) by mouth daily. 90 tablet 0  . spironolactone  (ALDACTONE ) 25 MG tablet Take 0.5 tablets (12.5 mg total) by mouth every morning. 45 tablet 0   No  facility-administered medications prior to visit.    Allergies  Allergen Reactions  . Metformin Diarrhea    Only a high dose    ROS See HPI    Objective:    Physical Exam  General: No acute distress. Awake and conversant.  Eyes: Normal conjunctiva, anicteric. Round symmetric pupils.  ENT: Hearing grossly intact. +rhinorrhea- clear   Neck: Neck is supple. No masses or thyromegaly.  Respiratory: CTAB. Respirations are non-labored. No wheezing.  Skin: Warm. No rashes or ulcers.  Psych: Alert and oriented. Cooperative, Appropriate mood and affect, Normal judgment.  CV: RRR. SABRANo lower extremity edema.  MSK: Normal ambulation. No clubbing or cyanosis.  LMP 10/03/2012  Wt Readings from Last 3 Encounters:  08/20/23 141 lb (64 kg)  07/26/23 140 lb 10.5 oz (63.8 kg)  07/17/23 155 lb (70.3 kg)       Assessment & Plan:   Problem List Items Addressed This Visit     Acute HFrEF (heart failure with reduced ejection fraction) (HCC)   Recent echo 30% EF.  Encouraged patient to participate in cardiology refferal        Hypertension   Well controlled, no changes to meds.   Encouraged heart healthy diet such as the DASH diet and exercise as tolerated.      - BP goal <130/80 - monitor and log blood pressures at home - check around the same time each day in a relaxed setting - Limit salt to <2000 mg/day - Follow DASH eating plan (heart healthy diet) - limit alcohol to 2 standard drinks per day for men and 1 per day for women - avoid tobacco products - get at least 2 hours of regular aerobic exercise weekly Patient aware of signs/symptoms requiring further/urgent evaluation.       Type 2 diabetes mellitus with hyperglycemia (HCC) - Primary   Lab Results  Component Value Date   HGBA1C 15.4 (H) 07/19/2023  Referral sent to endocrinology for further evaluation. Patient currently on Jardiance  10 mg daily, insulin  aspart 3 times daily, insulin  glargine 15 units daily.  She  was recently started on these medications in May 2025 following ED visit.  Adjust your long-acting insulin  glargine (SEMGLEE ) by increasing the dose by 2 units q 2 days if your fasting morning blood sugar is consistently over 130 mg/dL. If your fasting blood sugar falls below 80 mg/dL, reduce the dose by 2 units.         I am having [name] maintain her furosemide , losartan , rosuvastatin , spironolactone , empagliflozin , insulin  glargine-yfgn, insulin  aspart, Blood Glucose Monitor System, BLOOD GLUCOSE TEST STRIPS, Lancet Device, Lancets, Pen Needles, and fluticasone .  No orders of the defined types were placed in this encounter.

## 2023-08-27 ENCOUNTER — Ambulatory Visit: Admitting: Student

## 2023-08-27 ENCOUNTER — Encounter: Payer: Self-pay | Admitting: Student

## 2023-08-27 DIAGNOSIS — I5021 Acute systolic (congestive) heart failure: Secondary | ICD-10-CM

## 2023-08-27 DIAGNOSIS — E1165 Type 2 diabetes mellitus with hyperglycemia: Secondary | ICD-10-CM

## 2023-08-27 DIAGNOSIS — I10 Essential (primary) hypertension: Secondary | ICD-10-CM

## 2023-08-27 NOTE — Patient Instructions (Addendum)
 Insulin  Glargine (SEMGLEE )- Long Acting insulin  Insulin  Adjustment Instructions:  -Adjust your long-acting insulin  (glargine) based on your morning (fasting) blood sugar levels: -If your morning blood sugar is consistently over 130 mg/dL, increase your glargine dose by 2 units every 3 days. -If your morning blood sugar drops below 80 mg/dL, decrease your dose by 2 units.  Contact the clinic if you have questions or experience low blood sugars.

## 2023-09-03 ENCOUNTER — Other Ambulatory Visit: Payer: Self-pay | Admitting: Student

## 2023-09-03 ENCOUNTER — Ambulatory Visit: Admitting: Student

## 2023-09-03 ENCOUNTER — Encounter: Payer: Self-pay | Admitting: Student

## 2023-09-03 VITALS — BP 160/90 | HR 110 | Temp 98.0°F | Ht 65.0 in | Wt 153.0 lb

## 2023-09-03 DIAGNOSIS — R053 Chronic cough: Secondary | ICD-10-CM | POA: Insufficient documentation

## 2023-09-03 DIAGNOSIS — I10 Essential (primary) hypertension: Secondary | ICD-10-CM

## 2023-09-03 DIAGNOSIS — K219 Gastro-esophageal reflux disease without esophagitis: Secondary | ICD-10-CM

## 2023-09-03 DIAGNOSIS — Z794 Long term (current) use of insulin: Secondary | ICD-10-CM

## 2023-09-03 DIAGNOSIS — E1165 Type 2 diabetes mellitus with hyperglycemia: Secondary | ICD-10-CM | POA: Diagnosis not present

## 2023-09-03 DIAGNOSIS — R0602 Shortness of breath: Secondary | ICD-10-CM | POA: Insufficient documentation

## 2023-09-03 MED ORDER — LOSARTAN POTASSIUM 50 MG PO TABS: 50.0000 mg | ORAL_TABLET | Freq: Every day | ORAL | 1 refills | Status: DC

## 2023-09-03 MED ORDER — ALBUTEROL SULFATE HFA 108 (90 BASE) MCG/ACT IN AERS: 1.0000 | INHALATION_SPRAY | RESPIRATORY_TRACT | 2 refills | Status: DC | PRN

## 2023-09-03 MED ORDER — INSULIN GLARGINE-YFGN 100 UNIT/ML ~~LOC~~ SOPN: 15.0000 [IU] | PEN_INJECTOR | Freq: Every day | SUBCUTANEOUS | 1 refills | Status: DC

## 2023-09-03 MED ORDER — INSULIN ASPART 100 UNIT/ML FLEXPEN
0.0000 [IU] | PEN_INJECTOR | Freq: Three times a day (TID) | SUBCUTANEOUS | 1 refills | Status: AC
Start: 2023-09-03 — End: ?

## 2023-09-03 MED ORDER — BLOOD GLUCOSE TEST VI STRP: 1.0000 | ORAL_STRIP | Freq: Three times a day (TID) | 0 refills | Status: DC

## 2023-09-03 MED ORDER — OMEPRAZOLE 20 MG PO CPDR: 20.0000 mg | DELAYED_RELEASE_CAPSULE | Freq: Every day | ORAL | 3 refills | Status: DC

## 2023-09-03 NOTE — Progress Notes (Signed)
 Subjective:     Patient ID: Natasha Hudson, female    DOB: 12/21/1965, 58 y.o.   MRN: [id number]  Chief Complaint  Patient presents with   Cough    X2wks; dry mostly but have coughed up some mucus; no congestion, no sore throat, no other symptoms; just feels like I can't breathe sometimes; been doing sinus rinse but not helping   Joint Swelling    Starting swelling yesterday; nothing done differently; not painful to walk     HPI Natasha Hudson presents for follow-up.  Prevents with her daughter Natasha Hudson for OV. She has complaints of shortness of breath with exertion. Recent Echo 08/2023: LVEF- 25-30%.  She denies upper respiratory symptoms  or sick contacts.  She denies fever, nasal drainage, sinus pressure.  Report a dry, nagging cough occurring for past few weeks.  Nonproductive cough.  Patient denies fever, chills, SOB, CP, palpitations, dyspnea, HA, vision changes, N/V/D, abdominal pain, urinary symptoms, rash, weight changes, and recent illness or hospitalizations.   Type 2 Diabetes Empagliflozin  (Jardiance ) 10 mg daily, Insulin  Aspart (NovoLog ), Insulin  glargine 15 u Reports compliant with medications Fasting AM BS-150-160  Denies hypoglycemic events, polyuria, polydipsia, or polyphagia. Denies unexplained weight changes, visual changes, or neuropathy symptoms. Denies foot ulcers, wounds, or pain  Hypertension Lasix  20 mg Spironolactone  disease (Aldactone ) 12.5 mg daily losartan  (Cozaar ) 25 mg at bedtime Compliant with medications BPs at home 140s/90s  BP Readings from Last 2 Encounters:  09/03/23 (!) 160/90  08/20/23 (!) 142/82    CHF Recent Echo 08/2023: LVEF- 25-30%.  She was referred to cardiology- no show for June 6th appointment.  Hyperlipidemia Rosuvastatin  (Crestor ) 20 mg daily, compliant Lab Results  Component Value Date   CHOL 152 07/20/2023   HDL 41 07/20/2023   LDLCALC 96 07/20/2023   TRIG 73 07/20/2023   CHOLHDL 3.7 07/20/2023    Vision: Seen by  ophthalmology, upcoming appointment    History of Present Illness              Health Maintenance Due  Topic Date Due   FOOT EXAM  Never done   Diabetic kidney evaluation - Urine ACR  Never done   Hepatitis C Screening  Never done   Colonoscopy  Never done   MAMMOGRAM  Never done    Past Medical History:  Diagnosis Date   Diabetes mellitus without complication (HCC)    Hypertension     Past Surgical History:  Procedure Laterality Date   RIGHT/LEFT HEART CATH AND CORONARY ANGIOGRAPHY N/A 07/23/2023   Procedure: RIGHT/LEFT HEART CATH AND CORONARY ANGIOGRAPHY;  Surgeon: Wonda Sharper, MD;  Location: [facility] INVASIVE CV LAB;  Service: Cardiovascular;  Laterality: N/A;   TRANSESOPHAGEAL ECHOCARDIOGRAM (CATH LAB) N/A 07/26/2023   Procedure: TRANSESOPHAGEAL ECHOCARDIOGRAM;  Surgeon: Francyne Headland, MD;  Location: [facility] INVASIVE CV LAB;  Service: Cardiovascular;  Laterality: N/A;    Family History  Problem Relation Age of Onset   Hypertension Mother    Diabetes Mother    Diabetes Sister    Diabetes Brother    Diabetes Other    Hypertension Other     Social History   Socioeconomic History   Marital status: Single    Spouse name: Not on file   Number of children: Not on file   Years of education: Not on file   Highest education level: Not on file  Occupational History   Not on file  Tobacco Use   Smoking status: Never   Smokeless tobacco: Never  Vaping  Use   Vaping status: Never Used  Substance and Sexual Activity   Alcohol use: No   Drug use: No   Sexual activity: Not on file  Other Topics Concern   Not on file  Social History Narrative   Not on file   Social Drivers of Health   Financial Resource Strain: Not on file  Food Insecurity: No Food Insecurity (07/19/2023)   Hunger Vital Sign    Worried About Running Out of Food in the Last Year: Never true    Ran Out of Food in the Last Year: Never true  Transportation Needs: No Transportation Needs (07/19/2023)    PRAPARE - Administrator, Civil Service (Medical): No    Lack of Transportation (Non-Medical): No  Physical Activity: Not on file  Stress: Not on file  Social Connections: Unknown (07/19/2023)   Social Connection and Isolation Panel    Frequency of Communication with Friends and Family: Three times a week    Frequency of Social Gatherings with Friends and Family: Three times a week    Attends Religious Services: 1 to 4 times per year    Active Member of Clubs or Organizations: No    Attends Banker Meetings: Never    Marital Status: Patient declined  Intimate Partner Violence: Not At Risk (07/19/2023)   Humiliation, Afraid, Rape, and Kick questionnaire    Fear of Current or Ex-Partner: No    Emotionally Abused: No    Physically Abused: No    Sexually Abused: No    Outpatient Medications Prior to Visit  Medication Sig Dispense Refill   Blood Glucose Monitoring Suppl (BLOOD GLUCOSE MONITOR SYSTEM) w/Device KIT Use to check blood sugar as directed 1 kit 0   empagliflozin  (JARDIANCE ) 10 MG TABS tablet Take 1 tablet (10 mg total) by mouth daily. 90 tablet 0   fluticasone  (FLONASE ) 50 MCG/ACT nasal spray Place 1 spray into both nostrils daily. 16 g 1   furosemide  (LASIX ) 20 MG tablet Take 1 tablet (20 mg total) by mouth daily. 90 tablet 0   Insulin  Pen Needle (PEN NEEDLES) 31G X 5 MM MISC Use to inject insulin  3 times daily 100 each 0   Lancet Device MISC 1 each by Does not apply route 3 (three) times daily. May dispense any manufacturer covered by patient's insurance. 1 each 0   Lancets MISC Use to check blood sugar three times daily 100 each 0   rosuvastatin  (CRESTOR ) 20 MG tablet Take 1 tablet (20 mg total) by mouth daily. 90 tablet 0   spironolactone  (ALDACTONE ) 25 MG tablet Take 0.5 tablets (12.5 mg total) by mouth every morning. 45 tablet 0   Glucose Blood (BLOOD GLUCOSE TEST STRIPS) STRP Use to check blood sugar three times daily 100 strip 0   insulin  aspart  (NOVOLOG ) 100 UNIT/ML FlexPen Inject 0-6 Units into the skin 3 (three) times daily with meals. Check Blood Glucose (BG) and inject per scale: BG <150= 0 unit; BG 150-200= 1 unit; BG 201-250= 2 unit; BG 251-300= 3 unit; BG 301-350= 4 unit; BG 351-400= 5 unit; BG >400= 6 unit and Call Primary Care. 15 mL 0   insulin  glargine-yfgn (SEMGLEE ) 100 UNIT/ML Pen Inject 15 Units into the skin daily. 15 mL 0   losartan  (COZAAR ) 25 MG tablet Take 1 tablet (25 mg total) by mouth daily at 10 pm. 90 tablet 0   No facility-administered medications prior to visit.    Allergies  Allergen Reactions   Metformin  Diarrhea    Only a high dose    ROS See HPI    Objective:    Physical Exam  General: No acute distress. Awake and conversant.  Eyes: Normal conjunctiva, anicteric. Round symmetric pupils.  ENT: Hearing grossly intact.  No nasal drainage. Respiratory: CTAB. Respirations are non-labored. No wheezing.  Skin: Warm. No rashes or ulcers.  Psych: Alert and oriented. Cooperative, Appropriate mood and affect, Normal judgment.  CV: RRR.  +1 Bilateral LE edema MSK: Normal ambulation. No clubbing or cyanosis.     BP (!) 160/90   Pulse (!) 110   Temp 98 F (36.7 C)   Ht 5' 5 (1.651 m)   Wt 153 lb (69.4 kg)   LMP 10/03/2012   SpO2 98%   BMI 25.46 kg/m  Wt Readings from Last 3 Encounters:  09/03/23 153 lb (69.4 kg)  08/20/23 141 lb (64 kg)  07/26/23 140 lb 10.5 oz (63.8 kg)       Assessment & Plan:   Problem List Items Addressed This Visit     GERD (gastroesophageal reflux disease)   Pt reports Hx of GERD.  Rx- omeprazole 20 mg daily.  Cough may be linked to untreated GERD and will reassess at FU visit. Counseled on lifestyle modifications including avoiding trigger foods (spicy, fatty, acidic), eating smaller meals, not lying down within 2-3 hours of eating, elevating head of bed, and weight management if applicable. Will reassess symptom control and medication response at FU. Advised to  return sooner if symptoms worsen or new concerns arise.       Relevant Medications   omeprazole (PRILOSEC) 20 MG capsule   Hypertension - Primary   Uncontrolled.  Losartan  increased to 50 mg daily.  Patient to follow-up with cardiology. Return to clinic in 2 weeks for nurse visit- for BP recheck and BMP.      Relevant Medications   losartan  (COZAAR ) 50 MG tablet   Other Relevant Orders   Basic metabolic panel with GFR   SOB (shortness of breath)   Patient has CHF, and reports history of asthma. Rx- Albuterol  prn.  Referral sent for pulmonology. Patient advised to seek emergency care if shortness of breath or other symptoms worsen.      Relevant Medications   albuterol  (VENTOLIN  HFA) 108 (90 Base) MCG/ACT inhaler   Other Relevant Orders   Ambulatory referral to Pulmonology   Type 2 diabetes mellitus with hyperglycemia (HCC)   Uncontrolled.  Referral sent for endocrinology.  Provided patient and patient daughter education on titration of long-acting insulin .  All questions answered answered.  Minimize simple carbs. Increase exercise as tolerated. Continue current meds.      Relevant Medications   losartan  (COZAAR ) 50 MG tablet   Glucose Blood (BLOOD GLUCOSE TEST STRIPS) STRP   insulin  glargine-yfgn (SEMGLEE ) 100 UNIT/ML Pen   insulin  aspart (NOVOLOG ) 100 UNIT/ML FlexPen   Other Relevant Orders   Ambulatory referral to Endocrinology   Patient was educated on the diagnosis, treatment options, potential risks, benefits, and alternatives. All questions were addressed. Patient verbalized understanding and agrees with the plan of care. Will follow up as advised or sooner if symptoms worsen or new concerns arise.   Portions of this note were dictated using DRAGON voice recognition software. Please disregard any errors in transcription.     I have discontinued Scotlyn Markarian's losartan . I am also having her start on losartan , albuterol , and omeprazole. Additionally, I am having her  maintain her furosemide , rosuvastatin , spironolactone , empagliflozin , Blood Glucose  Monitor System, Lancet Device, Lancets, Pen Needles, fluticasone , BLOOD GLUCOSE TEST STRIPS, insulin  glargine-yfgn, and insulin  aspart.  Meds ordered this encounter  Medications   losartan  (COZAAR ) 50 MG tablet    Sig: Take 1 tablet (50 mg total) by mouth daily.    Dispense:  90 tablet    Refill:  1    Supervising Provider:   DOMENICA BLACKBIRD A [[id number]]   albuterol  (VENTOLIN  HFA) 108 (90 Base) MCG/ACT inhaler    Sig: Inhale 1-2 puffs into the lungs every 4 (four) hours as needed for wheezing or shortness of breath.    Dispense:  8 g    Refill:  2    Supervising Provider:   DOMENICA BLACKBIRD A [[id number]]   Glucose Blood (BLOOD GLUCOSE TEST STRIPS) STRP    Sig: Use to check blood sugar three times daily    Dispense:  100 strip    Refill:  0    Supervising Provider:   DOMENICA BLACKBIRD A [[id number]]   insulin  glargine-yfgn (SEMGLEE ) 100 UNIT/ML Pen    Sig: Inject 15 Units into the skin daily.    Dispense:  15 mL    Refill:  1   insulin  aspart (NOVOLOG ) 100 UNIT/ML FlexPen    Sig: Inject 0-6 Units into the skin 3 (three) times daily with meals. Check Blood Glucose (BG) and inject per scale: BG <150= 0 unit; BG 150-200= 1 unit; BG 201-250= 2 unit; BG 251-300= 3 unit; BG 301-350= 4 unit; BG 351-400= 5 unit; BG >400= 6 unit and Call Primary Care.    Dispense:  15 mL    Refill:  1   omeprazole (PRILOSEC) 20 MG capsule    Sig: Take 1 capsule (20 mg total) by mouth daily.    Dispense:  30 capsule    Refill:  3    Supervising Provider:   DOMENICA BLACKBIRD A [[id number]]

## 2023-09-03 NOTE — Assessment & Plan Note (Signed)
 Uncontrolled.  Referral sent for endocrinology.  Provided patient and patient daughter education on titration of long-acting insulin .  All questions answered answered.  Minimize simple carbs. Increase exercise as tolerated. Continue current meds.

## 2023-09-03 NOTE — Assessment & Plan Note (Addendum)
 Patient has CHF, and reports history of asthma. Rx- Albuterol  prn.  Referral sent for pulmonology.Recent Echo 08/2023: LVEF- 25-30%. Encouraged pt compliance with medications and monitor weight. RTC if gain more than 2-3 pounds a day or 5 pounds a week. Pt was referred to cardiology- no show for June 6th appointment. Encouraged Pt to FU with Cardiology. Patient advised to seek emergency care if shortness of breath or other symptoms worsen.

## 2023-09-03 NOTE — Assessment & Plan Note (Signed)
 Pt reports Hx of GERD.  Rx- omeprazole 20 mg daily.  Cough may be linked to untreated GERD and will reassess at FU visit. Counseled on lifestyle modifications including avoiding trigger foods (spicy, fatty, acidic), eating smaller meals, not lying down within 2-3 hours of eating, elevating head of bed, and weight management if applicable. Will reassess symptom control and medication response at FU. Advised to return sooner if symptoms worsen or new concerns arise.

## 2023-09-03 NOTE — Assessment & Plan Note (Signed)
 Uncontrolled.  Losartan  increased to 50 mg daily.  Patient to follow-up with cardiology. Return to clinic in 2 weeks for nurse visit- for BP recheck and BMP.

## 2023-09-06 ENCOUNTER — Other Ambulatory Visit: Payer: Self-pay

## 2023-09-06 ENCOUNTER — Encounter (HOSPITAL_COMMUNITY): Payer: Self-pay

## 2023-09-06 ENCOUNTER — Inpatient Hospital Stay (HOSPITAL_COMMUNITY)
Admission: EM | Admit: 2023-09-06 | Discharge: 2023-09-08 | DRG: 291 | Disposition: A | Discharge status: Home / Self Care | Attending: Internal Medicine | Admitting: Internal Medicine

## 2023-09-06 ENCOUNTER — Emergency Department (HOSPITAL_COMMUNITY)

## 2023-09-06 DIAGNOSIS — I509 Heart failure, unspecified: Principal | ICD-10-CM

## 2023-09-06 DIAGNOSIS — I110 Hypertensive heart disease with heart failure: Secondary | ICD-10-CM | POA: Diagnosis present

## 2023-09-06 DIAGNOSIS — Z833 Family history of diabetes mellitus: Secondary | ICD-10-CM | POA: Diagnosis not present

## 2023-09-06 DIAGNOSIS — I428 Other cardiomyopathies: Secondary | ICD-10-CM | POA: Diagnosis present

## 2023-09-06 DIAGNOSIS — E1165 Type 2 diabetes mellitus with hyperglycemia: Secondary | ICD-10-CM

## 2023-09-06 DIAGNOSIS — I429 Cardiomyopathy, unspecified: Secondary | ICD-10-CM

## 2023-09-06 DIAGNOSIS — I10 Essential (primary) hypertension: Secondary | ICD-10-CM | POA: Diagnosis present

## 2023-09-06 DIAGNOSIS — J45909 Unspecified asthma, uncomplicated: Secondary | ICD-10-CM | POA: Diagnosis present

## 2023-09-06 DIAGNOSIS — Z79899 Other long term (current) drug therapy: Secondary | ICD-10-CM

## 2023-09-06 DIAGNOSIS — E119 Type 2 diabetes mellitus without complications: Secondary | ICD-10-CM | POA: Diagnosis not present

## 2023-09-06 DIAGNOSIS — Z7984 Long term (current) use of oral hypoglycemic drugs: Secondary | ICD-10-CM

## 2023-09-06 DIAGNOSIS — Z794 Long term (current) use of insulin: Secondary | ICD-10-CM

## 2023-09-06 DIAGNOSIS — J4520 Mild intermittent asthma, uncomplicated: Secondary | ICD-10-CM | POA: Diagnosis not present

## 2023-09-06 DIAGNOSIS — Z8249 Family history of ischemic heart disease and other diseases of the circulatory system: Secondary | ICD-10-CM | POA: Diagnosis not present

## 2023-09-06 DIAGNOSIS — I5021 Acute systolic (congestive) heart failure: Secondary | ICD-10-CM | POA: Diagnosis present

## 2023-09-06 DIAGNOSIS — Z91148 Patient's other noncompliance with medication regimen for other reason: Secondary | ICD-10-CM | POA: Diagnosis not present

## 2023-09-06 DIAGNOSIS — I5023 Acute on chronic systolic (congestive) heart failure: Secondary | ICD-10-CM | POA: Diagnosis not present

## 2023-09-06 DIAGNOSIS — Z888 Allergy status to other drugs, medicaments and biological substances status: Secondary | ICD-10-CM | POA: Diagnosis not present

## 2023-09-06 DIAGNOSIS — K219 Gastro-esophageal reflux disease without esophagitis: Secondary | ICD-10-CM

## 2023-09-06 LAB — CBC WITH DIFFERENTIAL/PLATELET
Abs Immature Granulocytes: 0.03 10*3/uL (ref 0.00–0.07)
Basophils Absolute: 0 10*3/uL (ref 0.0–0.1)
Basophils Relative: 0 %
Eosinophils Absolute: 0.1 10*3/uL (ref 0.0–0.5)
Eosinophils Relative: 1 %
HCT: 39.1 % (ref 36.0–46.0)
Hemoglobin: 11.8 g/dL — ABNORMAL LOW (ref 12.0–15.0)
Immature Granulocytes: 0 %
Lymphocytes Relative: 22 %
Lymphs Abs: 1.7 10*3/uL (ref 0.7–4.0)
MCH: 26.9 pg (ref 26.0–34.0)
MCHC: 30.2 g/dL (ref 30.0–36.0)
MCV: 89.3 fL (ref 80.0–100.0)
Monocytes Absolute: 0.7 10*3/uL (ref 0.1–1.0)
Monocytes Relative: 9 %
Neutro Abs: 5.4 10*3/uL (ref 1.7–7.7)
Neutrophils Relative %: 68 %
Platelets: 467 10*3/uL — ABNORMAL HIGH (ref 150–400)
RBC: 4.38 MIL/uL (ref 3.87–5.11)
RDW: 14.8 % (ref 11.5–15.5)
WBC: 8 10*3/uL (ref 4.0–10.5)
nRBC: 0 % (ref 0.0–0.2)

## 2023-09-06 LAB — TROPONIN I (HIGH SENSITIVITY)
Troponin I (High Sensitivity): 24 ng/L — ABNORMAL HIGH (ref <18)
Troponin I (High Sensitivity): 24 ng/L — ABNORMAL HIGH (ref <18)

## 2023-09-06 LAB — CBC
HCT: 40 % (ref 36.0–46.0)
Hemoglobin: 12.4 g/dL (ref 12.0–15.0)
MCH: 27.6 pg (ref 26.0–34.0)
MCHC: 31 g/dL (ref 30.0–36.0)
MCV: 89.1 fL (ref 80.0–100.0)
Platelets: 521 10*3/uL — ABNORMAL HIGH (ref 150–400)
RBC: 4.49 MIL/uL (ref 3.87–5.11)
RDW: 14.8 % (ref 11.5–15.5)
WBC: 8.8 10*3/uL (ref 4.0–10.5)
nRBC: 0 % (ref 0.0–0.2)

## 2023-09-06 LAB — COMPREHENSIVE METABOLIC PANEL WITH GFR
ALT: 287 U/L — ABNORMAL HIGH (ref 0–44)
AST: 90 U/L — ABNORMAL HIGH (ref 15–41)
Albumin: 3.1 g/dL — ABNORMAL LOW (ref 3.5–5.0)
Alkaline Phosphatase: 140 U/L — ABNORMAL HIGH (ref 38–126)
Anion gap: 10 (ref 5–15)
BUN: 10 mg/dL (ref 6–20)
CO2: 24 mmol/L (ref 22–32)
Calcium: 8.8 mg/dL — ABNORMAL LOW (ref 8.9–10.3)
Chloride: 105 mmol/L (ref 98–111)
Creatinine, Ser: 0.38 mg/dL — ABNORMAL LOW (ref 0.44–1.00)
GFR, Estimated: >60 mL/min (ref >60)
Glucose, Bld: 113 mg/dL — ABNORMAL HIGH (ref 70–99)
Potassium: 3.5 mmol/L (ref 3.5–5.1)
Sodium: 139 mmol/L (ref 135–145)
Total Bilirubin: 0.9 mg/dL (ref 0.0–1.2)
Total Protein: 6.3 g/dL — ABNORMAL LOW (ref 6.5–8.1)

## 2023-09-06 LAB — BRAIN NATRIURETIC PEPTIDE: B Natriuretic Peptide: 937.1 pg/mL — ABNORMAL HIGH (ref 0.0–100.0)

## 2023-09-06 LAB — GLUCOSE, CAPILLARY
Glucose-Capillary: 146 mg/dL — ABNORMAL HIGH (ref 70–99)
Glucose-Capillary: 225 mg/dL — ABNORMAL HIGH (ref 70–99)

## 2023-09-06 LAB — CREATININE, SERUM
Creatinine, Ser: 0.57 mg/dL (ref 0.44–1.00)
GFR, Estimated: >60 mL/min (ref >60)

## 2023-09-06 MED ORDER — FUROSEMIDE 10 MG/ML IJ SOLN
40.0000 mg | Freq: Once | INTRAMUSCULAR | Status: AC
  Administered 2023-09-06: 40 mg via INTRAVENOUS
  Filled 2023-09-06: qty 4

## 2023-09-06 MED ORDER — ACETAMINOPHEN 325 MG PO TABS: 650.0000 mg | ORAL_TABLET | Freq: Four times a day (QID) | ORAL | Status: DC | PRN

## 2023-09-06 MED ORDER — INSULIN ASPART 100 UNIT/ML IJ SOLN
0.0000 [IU] | Freq: Every day | INTRAMUSCULAR | Status: DC
  Administered 2023-09-06: 2 [IU] via SUBCUTANEOUS
  Filled 2023-09-06: qty 0.05

## 2023-09-06 MED ORDER — LOSARTAN POTASSIUM 50 MG PO TABS
50.0000 mg | ORAL_TABLET | Freq: Every day | ORAL | Status: DC
  Administered 2023-09-07 – 2023-09-08 (×2): 50 mg via ORAL
  Filled 2023-09-06 (×2): qty 1

## 2023-09-06 MED ORDER — FUROSEMIDE 10 MG/ML IJ SOLN
40.0000 mg | Freq: Two times a day (BID) | INTRAMUSCULAR | Status: DC
  Administered 2023-09-06 – 2023-09-08 (×5): 40 mg via INTRAVENOUS
  Filled 2023-09-06 (×5): qty 4

## 2023-09-06 MED ORDER — ACETAMINOPHEN 650 MG RE SUPP: 650.0000 mg | Freq: Four times a day (QID) | RECTAL | Status: DC | PRN

## 2023-09-06 MED ORDER — INSULIN ASPART 100 UNIT/ML IJ SOLN
0.0000 [IU] | Freq: Three times a day (TID) | INTRAMUSCULAR | Status: DC
  Administered 2023-09-06: 2 [IU] via SUBCUTANEOUS
  Administered 2023-09-07: 5 [IU] via SUBCUTANEOUS
  Administered 2023-09-07: 2 [IU] via SUBCUTANEOUS
  Administered 2023-09-07: 5 [IU] via SUBCUTANEOUS
  Administered 2023-09-08: 2 [IU] via SUBCUTANEOUS
  Administered 2023-09-08: 3 [IU] via SUBCUTANEOUS
  Filled 2023-09-06: qty 0.15

## 2023-09-06 MED ORDER — SPIRONOLACTONE 12.5 MG HALF TABLET
12.5000 mg | ORAL_TABLET | Freq: Every morning | ORAL | Status: DC
  Administered 2023-09-07 – 2023-09-08 (×2): 12.5 mg via ORAL
  Filled 2023-09-06 (×2): qty 1

## 2023-09-06 MED ORDER — ENOXAPARIN SODIUM 40 MG/0.4ML IJ SOSY
40.0000 mg | PREFILLED_SYRINGE | Freq: Every day | INTRAMUSCULAR | Status: DC
  Administered 2023-09-06 – 2023-09-08 (×3): 40 mg via SUBCUTANEOUS
  Filled 2023-09-06 (×3): qty 0.4

## 2023-09-06 MED ORDER — ROSUVASTATIN CALCIUM 20 MG PO TABS
20.0000 mg | ORAL_TABLET | Freq: Every day | ORAL | Status: DC
  Administered 2023-09-07 – 2023-09-08 (×2): 20 mg via ORAL
  Filled 2023-09-06 (×2): qty 1

## 2023-09-06 MED ORDER — INSULIN GLARGINE-YFGN 100 UNIT/ML ~~LOC~~ SOPN: 15.0000 [IU] | PEN_INJECTOR | Freq: Every day | SUBCUTANEOUS | Status: DC

## 2023-09-06 MED ORDER — POTASSIUM CHLORIDE CRYS ER 20 MEQ PO TBCR
20.0000 meq | EXTENDED_RELEASE_TABLET | Freq: Once | ORAL | Status: AC
  Administered 2023-09-06: 20 meq via ORAL
  Filled 2023-09-06: qty 1

## 2023-09-06 MED ORDER — INSULIN GLARGINE-YFGN 100 UNIT/ML ~~LOC~~ SOLN
15.0000 [IU] | Freq: Every day | SUBCUTANEOUS | Status: DC
  Administered 2023-09-07 – 2023-09-08 (×2): 15 [IU] via SUBCUTANEOUS
  Filled 2023-09-06 (×2): qty 0.15

## 2023-09-06 MED ORDER — PANTOPRAZOLE SODIUM 40 MG PO TBEC
40.0000 mg | DELAYED_RELEASE_TABLET | Freq: Every day | ORAL | Status: DC
  Administered 2023-09-07 – 2023-09-08 (×2): 40 mg via ORAL
  Filled 2023-09-06 (×2): qty 1

## 2023-09-06 NOTE — ED Provider Notes (Signed)
 [facility] EMERGENCY DEPARTMENT AT [facility] Provider Note   CSN: [id number] Arrival date & time: 09/06/23  1259     Patient presents with: Cough   Natasha Hudson is a 58 y.o. female.    Cough    Patient has a history of multifocal pneumonia diabetes hypertension CHF cardiomyopathy bundle branch block asthma hoarseness.  Patient had a follow-up visit with her primary care doctor on June 27.  At that time patient was complaining of shortness of breath persistent cough.  She had had a cough ongoing for couple of weeks that was nonproductive.  She also noticed some hoarseness.  Patient has history of CHF with ejection fraction of 25 to 30%.  Patient states at the doctor's office she was given a prescription for an inhaler.  Patient was also referred to pulmonology.  Patient feels like her symptoms have been getting worse.  She continues to cough and feels short of breath.  She has noticed increasing leg swelling.  Prior to Admission medications   Medication Sig Start Date End Date Taking? Authorizing Provider  albuterol  (VENTOLIN  HFA) 108 (90 Base) MCG/ACT inhaler Inhale 1-2 puffs into the lungs every 4 (four) hours as needed for wheezing or shortness of breath. 09/03/23   Wheeler Harlene CROME, NP  Blood Glucose Monitoring Suppl (BLOOD GLUCOSE MONITOR SYSTEM) w/Device KIT Use to check blood sugar as directed 07/26/23   Rojelio Nest, DO  empagliflozin  (JARDIANCE ) 10 MG TABS tablet Take 1 tablet (10 mg total) by mouth daily. 07/27/23   Rojelio Nest, DO  fluticasone  (FLONASE ) 50 MCG/ACT nasal spray Place 1 spray into both nostrils daily. 08/20/23   Yacopino, Jessica L, NP  furosemide  (LASIX ) 20 MG tablet Take 1 tablet (20 mg total) by mouth daily. 07/27/23   Rojelio Nest, DO  Glucose Blood (BLOOD GLUCOSE TEST STRIPS) STRP Use to check blood sugar three times daily 09/03/23   Yacopino, Jessica L, NP  insulin  aspart (NOVOLOG ) 100 UNIT/ML FlexPen Inject 0-6 Units into the skin 3  (three) times daily with meals. Check Blood Glucose (BG) and inject per scale: BG <150= 0 unit; BG 150-200= 1 unit; BG 201-250= 2 unit; BG 251-300= 3 unit; BG 301-350= 4 unit; BG 351-400= 5 unit; BG >400= 6 unit and Call Primary Care. 09/03/23   Wheeler Harlene CROME, NP  insulin  glargine-yfgn (SEMGLEE ) 100 UNIT/ML Pen Inject 15 Units into the skin daily. 09/03/23   Wheeler Harlene CROME, NP  Insulin  Pen Needle (PEN NEEDLES) 31G X 5 MM MISC Use to inject insulin  3 times daily 07/26/23   Rojelio Nest, DO  Lancet Device MISC 1 each by Does not apply route 3 (three) times daily. May dispense any manufacturer covered by patient's insurance. 07/26/23   Rojelio Nest, DO  Lancets MISC Use to check blood sugar three times daily 07/26/23   Rojelio Nest, DO  losartan  (COZAAR ) 50 MG tablet Take 1 tablet (50 mg total) by mouth daily. 09/03/23   Yacopino, Jessica L, NP  omeprazole (PRILOSEC) 20 MG capsule TAKE 1 CAPSULE BY MOUTH EVERY DAY 09/06/23   Yacopino, Jessica L, NP  rosuvastatin  (CRESTOR ) 20 MG tablet Take 1 tablet (20 mg total) by mouth daily. 07/27/23   Rojelio Nest, DO  spironolactone  (ALDACTONE ) 25 MG tablet Take 0.5 tablets (12.5 mg total) by mouth every morning. 07/27/23   Rojelio Nest, DO    Allergies: Metformin    Review of Systems  Respiratory:  Positive for cough.     Updated Vital Signs BP ROLLEN)  161/103 (BP Location: Left Arm)   Pulse 98   Temp 98.4 F (36.9 C) (Oral)   Resp 20   Wt 70.8 kg   LMP 10/03/2012   SpO2 98%   BMI 25.96 kg/m   Physical Exam Vitals and nursing note reviewed.  Constitutional:      General: She is not in acute distress.    Appearance: She is well-developed.  HENT:     Head: Normocephalic and atraumatic.     Right Ear: External ear normal.     Left Ear: External ear normal.   Eyes:     General: No scleral icterus.       Right eye: No discharge.        Left eye: No discharge.     Conjunctiva/sclera: Conjunctivae normal.   Neck:     Trachea: No  tracheal deviation.   Cardiovascular:     Rate and Rhythm: Normal rate and regular rhythm.  Pulmonary:     Effort: Pulmonary effort is normal. No respiratory distress.     Breath sounds: No stridor. Rales present. No wheezing.  Abdominal:     General: Bowel sounds are normal. There is no distension.     Palpations: Abdomen is soft.     Tenderness: There is no abdominal tenderness. There is no guarding or rebound.   Musculoskeletal:        General: No tenderness or deformity.     Cervical back: Neck supple.     Right lower leg: Edema present.     Left lower leg: Edema present.   Skin:    General: Skin is warm and dry.     Findings: No rash.   Neurological:     General: No focal deficit present.     Mental Status: She is alert.     Cranial Nerves: No cranial nerve deficit, dysarthria or facial asymmetry.     Sensory: No sensory deficit.     Motor: No abnormal muscle tone or seizure activity.     Coordination: Coordination normal.   Psychiatric:        Mood and Affect: Mood normal.     (all labs ordered are listed, but only abnormal results are displayed) Labs Reviewed  CBC WITH DIFFERENTIAL/PLATELET - Abnormal; Notable for the following components:      Result Value   Hemoglobin 11.8 (*)    Platelets 467 (*)    All other components within normal limits  COMPREHENSIVE METABOLIC PANEL WITH GFR - Abnormal; Notable for the following components:   Glucose, Bld 113 (*)    Creatinine, Ser 0.38 (*)    Calcium  8.8 (*)    Total Protein 6.3 (*)    Albumin 3.1 (*)    AST 90 (*)    ALT 287 (*)    Alkaline Phosphatase 140 (*)    All other components within normal limits  BRAIN NATRIURETIC PEPTIDE  TROPONIN I (HIGH SENSITIVITY)    EKG: EKG Interpretation Date/Time:  Monday September 06 2023 13:23:06 EDT Ventricular Rate:  106 PR Interval:  137 QRS Duration:  132 QT Interval:  376 QTC Calculation: 500 R Axis:   71  Text Interpretation: Sinus tachycardia Anterior infarct,  old Borderline T abnormalities, inferior leads Baseline wander in lead(s) V1 since last tracing no significant change Confirmed by Lenor Hollering [phone]) on 09/06/2023 1:26:12 PM  Radiology: DG Chest 2 View Result Date: 09/06/2023 CLINICAL DATA:  Cough. EXAM: CHEST - 2 VIEW COMPARISON:  07/20/2023 and CT chest 07/18/2023. FINDINGS:  Trachea is midline. Heart is enlarged, stable. Patchy left perihilar opacification with moderate bilateral pleural effusions and bibasilar mixed interstitial and airspace opacification. IMPRESSION: 1. Congestive heart failure. 2. Probable bibasilar atelectasis. 3. Difficult to exclude left perihilar pneumonia. Electronically Signed   By: Newell Eke M.D.   On: 09/06/2023 13:43     Procedures   Medications Ordered in the ED  furosemide  (LASIX ) injection 40 mg (has no administration in time range)  potassium chloride  SA (KLOR-CON  M) CR tablet 20 mEq (has no administration in time range)    Clinical Course as of 09/06/23 1416  Mon Sep 06, 2023  1407 CBC normal.  Metabolic panel shows increased LFTs [JK]  1408 Chest x-ray shows findings consistent with CHF.  Patient has bibasilar atelectasis [JK]    Clinical Course User Index [JK] Randol Simmonds, MD                                 Medical Decision Making Problems Addressed: Acute congestive heart failure, unspecified heart failure type [facility]): acute illness or injury that poses a threat to life or bodily functions  Amount and/or Complexity of Data Reviewed Labs: ordered. Decision-making details documented in ED Course. Radiology: ordered and independent interpretation performed.  Risk Prescription drug management.   Patient presented to the ED with complaints of increasing shortness of breath.  Patient saw her medical provider recently and was given a prescription for an inhaler.  Patient states her symptoms are not getting better.  In the ED patient noted to have increasing peripheral edema.  Her x-rays  does show findings consistent with CHF.  Patient noted to be tachycardic in the ED.  With her worsening symptoms I think she would benefit from hospitalization IV diuresis.  I have ordered Lasix .  LFTs also noted to be slightly elevated.  Could be related to hepatic congestion.  Patient's not having abdominal pain.  Will continue to monitor.     Final diagnoses:  Acute congestive heart failure, unspecified heart failure type [facility])    ED Discharge Orders     None          Randol Simmonds, MD 09/06/23 1441

## 2023-09-06 NOTE — ED Triage Notes (Signed)
 POV/ hx of asthma/ PCP started her on new inhaler and its not helping/ pt does not appear to be in any distress at this time/ pt is hoarse/ ambulatory/ A&OX4

## 2023-09-06 NOTE — H&P (Signed)
 History and Physical    Patient: Natasha Hudson FMW:[id number] DOB: 1966/01/31 DOA: 09/06/2023 DOS: the patient was seen and examined on 09/06/2023 PCP: Wheeler Harlene CROME, NP  Patient coming from: Home  Chief Complaint:  Chief Complaint  Patient presents with   Cough   HPI: Natasha Hudson is a 58 y.o. female with medical history significant of chf with EF 25%, dm2. Presented to ED with progressive sob with LE edema. Found to have CHF on CXR. Given dose of lasix . Hospitalist consutled for consideration for medical admission. Denies chest pain. Is sob.   On further questioning, drank roughly 2 16oz waters a day. Gained 20lb since last discharge. Stated wt gradually went up, thus pt did not really notice it   Review of Systems: As mentioned in the history of present illness. All other systems reviewed and are negative. Past Medical History:  Diagnosis Date   Diabetes mellitus without complication (HCC)    Hypertension    Past Surgical History:  Procedure Laterality Date   RIGHT/LEFT HEART CATH AND CORONARY ANGIOGRAPHY N/A 07/23/2023   Procedure: RIGHT/LEFT HEART CATH AND CORONARY ANGIOGRAPHY;  Surgeon: Wonda Sharper, MD;  Location: [facility]AS[facility];  Service: Cardiovascular;  Laterality: N/A;   TRANSESOPHAGEAL ECHOCARDIOGRAM (CATH LAB) N/A 07/26/2023   Procedure: TRANSESOPHAGEAL ECHOCARDIOGRAM;  Surgeon: Francyne Headland, MD;  Location: [facility] INVASIVE CV LAB;  Service: Cardiovascular;  Laterality: N/A;   Social History:  reports that she has never smoked. She has never used smokeless tobacco. She reports that she does not drink alcohol and does not use drugs.  Allergies  Allergen Reactions   Metformin Diarrhea    Only a high dose    Family History  Problem Relation Age of Onset   Hypertension Mother    Diabetes Mother    Diabetes Sister    Diabetes Brother    Diabetes Other    Hypertension Other     Prior to Admission medications   Medication Sig Start Date End Date  Taking? Authorizing Provider  albuterol  (VENTOLIN  HFA) 108 (90 Base) MCG/ACT inhaler Inhale 1-2 puffs into the lungs every 4 (four) hours as needed for wheezing or shortness of breath. 09/03/23   Wheeler Harlene CROME, NP  Blood Glucose Monitoring Suppl (BLOOD GLUCOSE MONITOR SYSTEM) w/Device KIT Use to check blood sugar as directed 07/26/23   Rojelio Nest, DO  empagliflozin  (JARDIANCE ) 10 MG TABS tablet Take 1 tablet (10 mg total) by mouth daily. 07/27/23   Rojelio Nest, DO  fluticasone  (FLONASE ) 50 MCG/ACT nasal spray Place 1 spray into both nostrils daily. 08/20/23   Yacopino, Jessica L, NP  furosemide  (LASIX ) 20 MG tablet Take 1 tablet (20 mg total) by mouth daily. 07/27/23   Rojelio Nest, DO  Glucose Blood (BLOOD GLUCOSE TEST STRIPS) STRP Use to check blood sugar three times daily 09/03/23   Yacopino, Jessica L, NP  insulin  aspart (NOVOLOG ) 100 UNIT/ML FlexPen Inject 0-6 Units into the skin 3 (three) times daily with meals. Check Blood Glucose (BG) and inject per scale: BG <150= 0 unit; BG 150-200= 1 unit; BG 201-250= 2 unit; BG 251-300= 3 unit; BG 301-350= 4 unit; BG 351-400= 5 unit; BG >400= 6 unit and Call Primary Care. 09/03/23   Wheeler Harlene CROME, NP  insulin  glargine-yfgn (SEMGLEE ) 100 UNIT/ML Pen Inject 15 Units into the skin daily. 09/03/23   Wheeler Harlene CROME, NP  Insulin  Pen Needle (PEN NEEDLES) 31G X 5 MM MISC Use to inject insulin  3 times daily 07/26/23   Rojelio Nest,  DO  Lancet Device MISC 1 each by Does not apply route 3 (three) times daily. May dispense any manufacturer covered by patient's insurance. 07/26/23   Rojelio Nest, DO  Lancets MISC Use to check blood sugar three times daily 07/26/23   Rojelio Nest, DO  losartan  (COZAAR ) 50 MG tablet Take 1 tablet (50 mg total) by mouth daily. 09/03/23   Yacopino, Jessica L, NP  omeprazole (PRILOSEC) 20 MG capsule TAKE 1 CAPSULE BY MOUTH EVERY DAY 09/06/23   Yacopino, Jessica L, NP  rosuvastatin  (CRESTOR ) 20 MG tablet Take 1 tablet (20  mg total) by mouth daily. 07/27/23   Rojelio Nest, DO  spironolactone  (ALDACTONE ) 25 MG tablet Take 0.5 tablets (12.5 mg total) by mouth every morning. 07/27/23   Rojelio Nest, DO    Physical Exam: Vitals:   09/06/23 1305 09/06/23 1308  BP: (!) 161/103   Pulse: 98   Resp: 20   Temp: 98.4 F (36.9 C)   TempSrc: Oral   SpO2: 98%   Weight:  70.8 kg   General exam: Awake, laying in bed, in nad Respiratory system: Normal respiratory effort, no wheezing Cardiovascular system: regular rate, s1, s2 Gastrointestinal system: Soft, nondistended, positive BS Central nervous system: CN2-12 grossly intact, strength intact Extremities: Perfused, no clubbing, BLE edema Skin: Normal skin turgor, no notable skin lesions seen Psychiatry: Mood normal // no visual hallucinations    Data Reviewed:  Labs reviewed: Na 139, K 3.5, Cr 0.38, WBC 8.0, Hgb 11.8  Assessment and Plan: Acute systolic HF Nonischemic cardiomyopathy  - Recent EF 25-30%  - PTA on Lasix , Jardiance , Losartan , Aldactone , plan to continue once confirmed by pharmacy - Recent heart cath with normal coronary arteries  -continue IV lasix  bid. Follow I/o and daily wts -Recheck bmet in AM   DM with hyperglycemia type 2 - Recent Ha1c 15.4  - Cont SSI  -cont semglee  and home semglee   Asthma -no audible wheezing at this time  HTN BP stable -Cont home meds once confirmed by pharmacy     Advance Care Planning:   Code Status: Prior Full  Consults:   Family Communication: Pt in room, family not at bedside  Severity of Illness: The appropriate patient status for this patient is INPATIENT. Inpatient status is judged to be reasonable and necessary in order to provide the required intensity of service to ensure the patient's safety. The patient's presenting symptoms, physical exam findings, and initial radiographic and laboratory data in the context of their chronic comorbidities is felt to place them at high risk for further  clinical deterioration. Furthermore, it is not anticipated that the patient will be medically stable for discharge from the hospital within 2 midnights of admission.   * I certify that at the point of admission it is my clinical judgment that the patient will require inpatient hospital care spanning beyond 2 midnights from the point of admission due to high intensity of service, high risk for further deterioration and high frequency of surveillance required.*  Author: Garnette Pelt, MD 09/06/2023 2:52 PM  For on call review [email or web address].

## 2023-09-07 DIAGNOSIS — I509 Heart failure, unspecified: Secondary | ICD-10-CM | POA: Diagnosis not present

## 2023-09-07 LAB — GLUCOSE, CAPILLARY
Glucose-Capillary: 212 mg/dL — ABNORMAL HIGH (ref 70–99)
Glucose-Capillary: 146 mg/dL — ABNORMAL HIGH (ref 70–99)
Glucose-Capillary: 214 mg/dL — ABNORMAL HIGH (ref 70–99)
Glucose-Capillary: 150 mg/dL — ABNORMAL HIGH (ref 70–99)

## 2023-09-07 LAB — CBC
HCT: 35.7 % — ABNORMAL LOW (ref 36.0–46.0)
Hemoglobin: 11 g/dL — ABNORMAL LOW (ref 12.0–15.0)
MCH: 27.5 pg (ref 26.0–34.0)
MCHC: 30.8 g/dL (ref 30.0–36.0)
MCV: 89.3 fL (ref 80.0–100.0)
Platelets: 433 10*3/uL — ABNORMAL HIGH (ref 150–400)
RBC: 4 MIL/uL (ref 3.87–5.11)
RDW: 14.9 % (ref 11.5–15.5)
WBC: 7.9 10*3/uL (ref 4.0–10.5)
nRBC: 0 % (ref 0.0–0.2)

## 2023-09-07 LAB — COMPREHENSIVE METABOLIC PANEL WITH GFR
ALT: 204 U/L — ABNORMAL HIGH (ref 0–44)
AST: 50 U/L — ABNORMAL HIGH (ref 15–41)
Albumin: 2.8 g/dL — ABNORMAL LOW (ref 3.5–5.0)
Alkaline Phosphatase: 114 U/L (ref 38–126)
Anion gap: 9 (ref 5–15)
BUN: 14 mg/dL (ref 6–20)
CO2: 27 mmol/L (ref 22–32)
Calcium: 8.8 mg/dL — ABNORMAL LOW (ref 8.9–10.3)
Chloride: 103 mmol/L (ref 98–111)
Creatinine, Ser: 0.74 mg/dL (ref 0.44–1.00)
GFR, Estimated: >60 mL/min (ref >60)
Glucose, Bld: 248 mg/dL — ABNORMAL HIGH (ref 70–99)
Potassium: 3.5 mmol/L (ref 3.5–5.1)
Sodium: 139 mmol/L (ref 135–145)
Total Bilirubin: 0.7 mg/dL (ref 0.0–1.2)
Total Protein: 5.9 g/dL — ABNORMAL LOW (ref 6.5–8.1)

## 2023-09-07 MED ORDER — EMPAGLIFLOZIN 10 MG PO TABS
10.0000 mg | ORAL_TABLET | Freq: Every day | ORAL | Status: DC
  Administered 2023-09-07 – 2023-09-08 (×2): 10 mg via ORAL
  Filled 2023-09-07 (×2): qty 1

## 2023-09-07 MED ORDER — LIVING WELL WITH DIABETES BOOK
Freq: Once | Status: AC
  Filled 2023-09-07: qty 1

## 2023-09-07 MED ORDER — GUAIFENESIN-DM 100-10 MG/5ML PO SYRP
5.0000 mL | ORAL_SOLUTION | ORAL | Status: DC | PRN
  Administered 2023-09-07 – 2023-09-08 (×5): 5 mL via ORAL
  Filled 2023-09-07 (×5): qty 10

## 2023-09-07 MED ORDER — POTASSIUM CHLORIDE CRYS ER 20 MEQ PO TBCR
60.0000 meq | EXTENDED_RELEASE_TABLET | Freq: Once | ORAL | Status: AC
  Administered 2023-09-07: 60 meq via ORAL
  Filled 2023-09-07: qty 3

## 2023-09-07 NOTE — Hospital Course (Signed)
 58 y.o. female with medical history significant of chf with EF 25%, dm2. Presented to ED with progressive sob with LE edema. Found to have CHF on CXR. Given dose of lasix . Hospitalist consutled for consideration for medical admission. Denies chest pain. Is sob.   Pt was admitted for diuresis and management for acute CHF

## 2023-09-07 NOTE — Plan of Care (Signed)
  Problem: Coping: Goal: Ability to adjust to condition or change in health will improve Outcome: Progressing   Problem: Health Behavior/Discharge Planning: Goal: Ability to manage health-related needs will improve Outcome: Progressing   Problem: Clinical Measurements: Goal: Ability to maintain clinical measurements within normal limits will improve Outcome: Progressing   Problem: Safety: Goal: Ability to remain free from injury will improve Outcome: Progressing

## 2023-09-07 NOTE — TOC Initial Note (Signed)
 Transition of Care [facility]) - Initial/Assessment Note    Patient Details  Name: Natasha Hudson MRN: [id number] Date of Birth: 06/04/1965  Transition of [facility] CM/SW Contact:    Bascom Service, RN Phone Number: 09/07/2023, 1:47 PM  Clinical Narrative: d/c plan home.                  Expected Discharge Plan: Home/Self Care Barriers to Discharge: Continued Medical Work up   Patient Goals and CMS Choice Patient states their goals for this hospitalization and ongoing recovery are:: Home CMS Medicare.gov Compare Post Acute Care list provided to:: Patient Choice offered to / list presented to : Patient      Expected Discharge Plan and Services                                              Prior Living Arrangements/Services                       Activities of Daily Living   ADL Screening (condition at time of admission) Independently performs ADLs?: Yes (appropriate for developmental age) Is the patient deaf or have difficulty hearing?: No Does the patient have difficulty seeing, even when wearing glasses/contacts?: No Does the patient have difficulty concentrating, remembering, or making decisions?: No  Permission Sought/Granted                  Emotional Assessment              Admission diagnosis:  CHF exacerbation (HCC) [I50.9] Acute congestive heart failure, unspecified heart failure type (HCC) [I50.9] Patient Active Problem List   Diagnosis Date Noted   CHF exacerbation (HCC) 09/06/2023   SOB (shortness of breath) 09/03/2023   Persistent dry cough 09/03/2023   Hypertension 08/26/2023   Chronic laryngitis 08/20/2023   Allergies 08/20/2023   Type 2 diabetes mellitus with hyperglycemia (HCC) 08/20/2023   Nonrheumatic mitral (valve) insufficiency 07/26/2023   Left atrial mass 07/26/2023   Rhinovirus 07/23/2023   Nonischemic cardiomyopathy (HCC) 07/23/2023   Systolic CHF, acute (HCC) 07/21/2023   Cardiomyopathy (HCC) 07/21/2023    LBBB (left bundle branch block) 07/21/2023   Mixed hyperlipidemia 07/21/2023   Acute HFrEF (heart failure with reduced ejection fraction) (HCC) 07/21/2023   Acute hypoxemic respiratory failure (HCC) 07/20/2023   Multifocal pneumonia 07/19/2023   Acute hypoxic respiratory failure (HCC) 07/19/2023   Non-insulin  dependent type 2 diabetes mellitus (HCC) 07/19/2023   Essential hypertension 07/19/2023   Vitamin D insufficiency 01/09/2019   Lesion of true vocal cord 11/03/2016   GERD (gastroesophageal reflux disease) 03/10/2016   Hoarseness 03/10/2016   Primary osteoarthritis of right knee 02/07/2016   Nail dystrophy 10/25/2015   Mild intermittent asthma 02/06/2015   Primary osteoarthritis of left knee 11/21/2013   Perennial allergic rhinitis with seasonal variation 04/07/2013   PCP:  Wheeler Harlene CROME, NP Pharmacy:   [id number] - [place], [place] - [address] [place] [address] Phone: [phone] Fax: [phone]     Social Drivers of Health (SDOH) Social History: SDOH Screenings   Food Insecurity: No Food Insecurity (09/06/2023)  Housing: Low Risk  (09/06/2023)  Transportation Needs: No Transportation Needs (09/06/2023)  Utilities: Not At Risk (09/06/2023)  Depression (PHQ2-9): Low Risk  (08/24/2023)  Social Connections: Unknown (07/19/2023)  Tobacco Use: Low  Risk  (09/06/2023)   SDOH Interventions:     Readmission Risk Interventions    07/24/2023    1:42 PM  Readmission Risk Prevention Plan  Post Dischage Appt Complete  Medication Screening Complete  Transportation Screening Complete

## 2023-09-07 NOTE — Progress Notes (Signed)
  Progress Note   Patient: Natasha Hudson FMW:[id number] DOB: 05-29-1965 DOA: 09/06/2023     1 DOS: the patient was seen and examined on 09/07/2023   Brief hospital course: 58 y.o. female with medical history significant of chf with EF 25%, dm2. Presented to ED with progressive sob with LE edema. Found to have CHF on CXR. Given dose of lasix . Hospitalist consutled for consideration for medical admission. Denies chest pain. Is sob.   Pt was admitted for diuresis and management for acute CHF  Assessment and Plan: Acute systolic HF Nonischemic cardiomyopathy  - Recent EF 25-30%  - Continue home Jardiance , Losartan , Aldactone  - Recent heart cath with normal coronary arteries  - continue IV lasix  bid. Diuresing well - Dry wt on last d/c: 63kg - Wt on admit: 70kg. Current wt: 67kg - Of note. On admit, pt claimed to be compliant with all cardiac meds. Today, pt asked for refills of all cardiac meds because she ran out   DM with hyperglycemia type 2 - Recent Ha1c 15.4  - Cont SSI  -cont home semglee  - resumed Jardiance    Asthma -no audible wheezing    HTN BP stable -Cont meds per above     Subjective: Feeling better. LE edema improving. Pt eager to go home soon  Physical Exam: Vitals:   09/06/23 2013 09/07/23 0105 09/07/23 0500 09/07/23 1317  BP: (!) 148/89 (!) 150/92  132/81  Pulse: (!) 109 (!) 103  100  Resp: 18 19  16   Temp: 98.6 F (37 C) 98.6 F (37 C)  98 F (36.7 C)  TempSrc: Oral Oral  Oral  SpO2: 95% 96%  99%  Weight:   67.3 kg   Height:       General exam: Awake, laying in bed, in nad Respiratory system: Normal respiratory effort, no wheezing Cardiovascular system: regular rate, s1, s2 Gastrointestinal system: Soft, nondistended, positive BS Central nervous system: CN2-12 grossly intact, strength intact Extremities: Perfused, no clubbing, BLE edema improving Skin: Normal skin turgor, no notable skin lesions seen Psychiatry: Mood normal // no visual  hallucinations   Data Reviewed:  Labs reviewed: Na 139, K 3.5, Cr 0.74, WBC 7.9, Hgb 11.0, Plts 433  Family Communication: Pt in room, family not at bedside  Disposition: Status is: Inpatient Remains inpatient appropriate because: severity of illness  Planned Discharge Destination: Home    Author: Garnette Pelt, MD 09/07/2023 4:57 PM  For on call review [email or web address].

## 2023-09-08 ENCOUNTER — Other Ambulatory Visit (HOSPITAL_COMMUNITY): Payer: Self-pay

## 2023-09-08 DIAGNOSIS — I10 Essential (primary) hypertension: Secondary | ICD-10-CM

## 2023-09-08 DIAGNOSIS — J45909 Unspecified asthma, uncomplicated: Secondary | ICD-10-CM | POA: Diagnosis present

## 2023-09-08 DIAGNOSIS — E119 Type 2 diabetes mellitus without complications: Secondary | ICD-10-CM

## 2023-09-08 DIAGNOSIS — I5023 Acute on chronic systolic (congestive) heart failure: Secondary | ICD-10-CM

## 2023-09-08 DIAGNOSIS — J4520 Mild intermittent asthma, uncomplicated: Secondary | ICD-10-CM | POA: Diagnosis not present

## 2023-09-08 DIAGNOSIS — Z794 Long term (current) use of insulin: Secondary | ICD-10-CM

## 2023-09-08 LAB — MAGNESIUM: Magnesium: 1.8 mg/dL (ref 1.7–2.4)

## 2023-09-08 LAB — COMPREHENSIVE METABOLIC PANEL WITH GFR
ALT: 149 U/L — ABNORMAL HIGH (ref 0–44)
AST: 27 U/L (ref 15–41)
Albumin: 2.8 g/dL — ABNORMAL LOW (ref 3.5–5.0)
Alkaline Phosphatase: 99 U/L (ref 38–126)
Anion gap: 8 (ref 5–15)
BUN: 15 mg/dL (ref 6–20)
CO2: 27 mmol/L (ref 22–32)
Calcium: 8.8 mg/dL — ABNORMAL LOW (ref 8.9–10.3)
Chloride: 106 mmol/L (ref 98–111)
Creatinine, Ser: 0.7 mg/dL (ref 0.44–1.00)
GFR, Estimated: >60 mL/min (ref >60)
Glucose, Bld: 100 mg/dL — ABNORMAL HIGH (ref 70–99)
Potassium: 3.8 mmol/L (ref 3.5–5.1)
Sodium: 141 mmol/L (ref 135–145)
Total Bilirubin: 0.8 mg/dL (ref 0.0–1.2)
Total Protein: 6 g/dL — ABNORMAL LOW (ref 6.5–8.1)

## 2023-09-08 LAB — GLUCOSE, CAPILLARY
Glucose-Capillary: 112 mg/dL — ABNORMAL HIGH (ref 70–99)
Glucose-Capillary: 134 mg/dL — ABNORMAL HIGH (ref 70–99)
Glucose-Capillary: 155 mg/dL — ABNORMAL HIGH (ref 70–99)

## 2023-09-08 MED ORDER — ALBUTEROL SULFATE (2.5 MG/3ML) 0.083% IN NEBU: 2.5000 mg | INHALATION_SOLUTION | RESPIRATORY_TRACT | Status: DC | PRN

## 2023-09-08 MED ORDER — OMEPRAZOLE 20 MG PO CPDR
40.0000 mg | DELAYED_RELEASE_CAPSULE | Freq: Every day | ORAL | 2 refills | Status: DC
  Filled 2023-09-08: qty 60, 30d supply, fill #0

## 2023-09-08 MED ORDER — EMPAGLIFLOZIN 10 MG PO TABS
10.0000 mg | ORAL_TABLET | Freq: Every day | ORAL | 2 refills | Status: DC
  Filled 2023-09-08: qty 30, 30d supply, fill #0
  Filled 2023-10-08: qty 30, 30d supply, fill #1
  Filled 2023-11-05: qty 30, 30d supply, fill #2

## 2023-09-08 MED ORDER — FUROSEMIDE 20 MG PO TABS
20.0000 mg | ORAL_TABLET | Freq: Every day | ORAL | 2 refills | Status: DC
  Filled 2023-09-08: qty 30, 30d supply, fill #0
  Filled 2023-10-08: qty 30, 30d supply, fill #1

## 2023-09-08 MED ORDER — FLUTICASONE PROPIONATE 50 MCG/ACT NA SUSP: 1.0000 | Freq: Every day | NASAL | Status: DC | PRN

## 2023-09-08 MED ORDER — LOSARTAN POTASSIUM 50 MG PO TABS
50.0000 mg | ORAL_TABLET | Freq: Every day | ORAL | 2 refills | Status: DC
  Filled 2023-09-08: qty 30, 30d supply, fill #0

## 2023-09-08 MED ORDER — ROSUVASTATIN CALCIUM 20 MG PO TABS
20.0000 mg | ORAL_TABLET | Freq: Every day | ORAL | 2 refills | Status: DC
  Filled 2023-09-08: qty 30, 30d supply, fill #0
  Filled 2023-10-08: qty 30, 30d supply, fill #1
  Filled 2023-11-05: qty 30, 30d supply, fill #2

## 2023-09-08 MED ORDER — SPIRONOLACTONE 25 MG PO TABS
12.5000 mg | ORAL_TABLET | Freq: Every morning | ORAL | 1 refills | Status: DC
  Filled 2023-09-08: qty 15, 30d supply, fill #0
  Filled 2023-09-22 – 2023-09-23 (×2): qty 15, 30d supply, fill #1
  Filled 2023-10-08 – 2023-10-09 (×2): qty 15, 30d supply, fill #2
  Filled 2023-10-12: qty 30, 60d supply, fill #2
  Filled 2023-11-05: qty 30, 60d supply, fill #3

## 2023-09-08 MED ORDER — ASPIRIN 81 MG PO TBEC
81.0000 mg | DELAYED_RELEASE_TABLET | Freq: Every day | ORAL | Status: DC
  Administered 2023-09-08: 81 mg via ORAL
  Filled 2023-09-08: qty 1

## 2023-09-08 NOTE — Plan of Care (Signed)
  Problem: Coping: Goal: Ability to adjust to condition or change in health will improve Outcome: Progressing   Problem: Health Behavior/Discharge Planning: Goal: Ability to identify and utilize available resources and services will improve Outcome: Progressing   Problem: Metabolic: Goal: Ability to maintain appropriate glucose levels will improve Outcome: Progressing

## 2023-09-08 NOTE — Discharge Summary (Signed)
 Physician Discharge Summary  Natasha Hudson FMW:[id number] DOB: 07/02/1965 DOA: 09/06/2023  PCP: Wheeler Harlene CROME, NP  Admit date: 09/06/2023 Discharge date: 09/08/2023  Time spent: 60 minutes  Recommendations for Outpatient Follow-up:  Follow-up with Wheeler Harlene CROME, NP in 2 weeks.  On follow-up patient will need a basic metabolic profile done to follow-up on electrolytes and renal function.  Patient's CHF will need to be followed up upon as well as follow-up on referral to outpatient cardiology.  Patient's diabetes will also need to be reassessed on follow-up.   Discharge Diagnoses:  Principal Problem:   Acute HFrEF (heart failure with reduced ejection fraction) (HCC) Active Problems:   Essential hypertension   Cardiomyopathy (HCC)   Nonischemic cardiomyopathy (HCC)   Type 2 diabetes mellitus with hyperglycemia (HCC)   CHF exacerbation (HCC)   Chronic asthma   Type 2 diabetes mellitus without complication, with long-term current use of insulin  (HCC)   Discharge Condition: Stable and improved.  Diet recommendation: Heart healthy  Filed Weights   09/06/23 1701 09/07/23 0500 09/08/23 0500  Weight: 67.2 kg 67.3 kg 68 kg    History of present illness:  HPI per Dr.Chiu Natasha Hudson is a 58 y.o. female with medical history significant of chf with EF 25%, dm2. Presented to ED with progressive sob with LE edema. Found to have CHF on CXR. Given dose of lasix . Hospitalist consutled for consideration for medical admission. Denies chest pain. Is sob.    On further questioning, drank roughly 2 16oz waters a day. Gained 20lb since last discharge. Stated wt gradually went up, thus pt did not really notice it.  Hospital Course:  #1 acute systolic heart failure/nonischemic cardiomyopathy -Patient had presented with progressive shortness of breath and lower extremity edema felt likely secondary to medication noncompliance as patient did state that she had run out of her medications for  over a week and thought PCP had prescribed refills however at pharmacy she did not have any refills left. - Patient recently had a 2D echo with a EF of 25 to 30%, underwent recent right heart cath with normal coronaries noted - Patient does state he missed hospital follow-up appointment with cardiology in the outpatient setting due to scheduling issues. - Patient noted to have a dry weight on last discharge of 63 kg on admission noted at 70 kg. - Patient placed on Lasix  40 mg IV every 12 hours, also maintained on home regimen of Jardiance  as well as losartan  and spironolactone  and Crestor . - Patient improved clinically.  Urine output not properly recorded. - Patient noted improvement in lower extremity edema and resolution of shortness of breath on ambulation with sats of 95% on ambulation per patient. - Patient insistent on being discharged home with clinical improvement and as such patient will be discharged home to resume home regimen of Lasix  20 mg daily, losartan  50 mg daily, spironolactone  12.5 mg daily in addition to Jardiance . - Prescriptions have been sent to pharmacy at [facility] outpatient pharmacy which will be given to patient prior to discharge. - Outpatient follow-up with PCP. - Outpatient follow-up with cardiology as noted that patient's PCP made a outpatient cardiology referral. - Compliance with medications stressed to patient. -Weight on day of discharge noted at 68 kg. - Patient will be discharged in stable and improved condition.  2.  Diabetes mellitus type 2 with hyperglycemia -Patient noted with recent A1c of 15.4. - Patient maintained on home regimen of Semglee  15 units daily, SSI as well as Jardiance . -  CBGs were well-controlled during the hospitalization by day of discharge. - Outpatient follow-up with PCP.  3.  Asthma -Remained stable during the hospitalization.  4.  Hypertension -Patient maintained on IV Lasix  during the hospitalization, home regimen of losartan   50 mg daily as well as per lactone 12.5 mg daily. - Outpatient follow-up with PCP.  Procedures: Chest x-ray 09/06/2023   Consultations: None  Discharge Exam: Vitals:   09/08/23 0948 09/08/23 1137  BP:  136/87  Pulse:  95  Resp:  19  Temp:  98 F (36.7 C)  SpO2: 96% 100%    General: NAD Cardiovascular: RRR no murmurs rubs or gallops.  No JVD.  1+ pedal edema. Respiratory: Clear to auscultation bilaterally.  No wheezes, no crackles, no rhonchi.  Fair air movement.  Speaking full sentences.  Discharge Instructions   Discharge Instructions     Diet - low sodium heart healthy   Complete by: As directed    Increase activity slowly   Complete by: As directed       Allergies as of 09/08/2023       Reactions   Metformin Diarrhea, Other (See Comments)   Only with a high dose (diarrhea)        Medication List     TAKE these medications    Accu-Chek Guide w/Device Kit Use to check blood sugar as directed   Accu-Chek Softclix Lancets lancets Use to check blood sugar three times daily   albuterol  108 (90 Base) MCG/ACT inhaler Commonly known as: VENTOLIN  HFA Inhale 1-2 puffs into the lungs every 4 (four) hours as needed for wheezing or shortness of breath. What changed: when to take this   Bayer Low Dose 81 MG tablet Generic drug: aspirin  EC Take 81 mg by mouth in the morning. Swallow whole.   BD Pen Needle Mini Ultrafine 31G X 5 MM Misc Generic drug: Insulin  Pen Needle Use to inject insulin  3 times daily   BLOOD GLUCOSE TEST STRIPS Strp Use to check blood sugar three times daily   fluticasone  50 MCG/ACT nasal spray Commonly known as: FLONASE  Place 1 spray into both nostrils daily. What changed:  when to take this reasons to take this   furosemide  20 MG tablet Commonly known as: LASIX  Take 1 tablet (20 mg total) by mouth daily. Start taking on: September 09, 2023   insulin  aspart 100 UNIT/ML FlexPen Commonly known as: NOVOLOG  Inject 0-6 Units into the  skin 3 (three) times daily with meals. Check Blood Glucose (BG) and inject per scale: BG <150= 0 unit; BG 150-200= 1 unit; BG 201-250= 2 unit; BG 251-300= 3 unit; BG 301-350= 4 unit; BG 351-400= 5 unit; BG >400= 6 unit and Call Primary Care.   insulin  glargine-yfgn 100 UNIT/ML Pen Commonly known as: SEMGLEE  Inject 15 Units into the skin daily.   Jardiance  10 MG Tabs tablet Generic drug: empagliflozin  Take 1 tablet (10 mg total) by mouth daily.   losartan  50 MG tablet Commonly known as: COZAAR  Take 1 tablet (50 mg total) by mouth at bedtime.   omeprazole 20 MG capsule Commonly known as: PRILOSEC Take 2 capsules (40 mg total) by mouth daily. What changed: how much to take   OneTouch Delica Plus Lancing Misc 1 each by Does not apply route 3 (three) times daily. May dispense any manufacturer covered by patient's insurance.   rosuvastatin  20 MG tablet Commonly known as: CRESTOR  Take 1 tablet (20 mg total) by mouth daily.   spironolactone  25 MG tablet Commonly known  as: ALDACTONE  Take 0.5 tablets (12.5 mg total) by mouth every morning.       Allergies  Allergen Reactions   Metformin Diarrhea and Other (See Comments)    Only with a high dose (diarrhea)    Follow-up Information     Wheeler Harlene CROME, NP. Schedule an appointment as soon as possible for a visit in 2 week(s).   Specialty: Nurse Practitioner Contact information: [address] [place] [address] [phone]                  The results of significant diagnostics from this hospitalization (including imaging, microbiology, ancillary and laboratory) are listed below for reference.    Significant Diagnostic Studies: DG Chest 2 View Result Date: 09/06/2023 CLINICAL DATA:  Cough. EXAM: CHEST - 2 VIEW COMPARISON:  07/20/2023 and CT chest 07/18/2023. FINDINGS: Trachea is midline. Heart is enlarged, stable. Patchy left perihilar opacification with moderate bilateral pleural effusions and  bibasilar mixed interstitial and airspace opacification. IMPRESSION: 1. Congestive heart failure. 2. Probable bibasilar atelectasis. 3. Difficult to exclude left perihilar pneumonia. Electronically Signed   By: Newell Eke M.D.   On: 09/06/2023 13:43    Microbiology: No results found for this or any previous visit (from the past 240 hours).   Labs: Basic Metabolic Panel: Recent Labs  Lab 09/06/23 1311 09/06/23 1731 09/07/23 0504 09/08/23 0509  NA 139  --  139 141  K 3.5  --  3.5 3.8  CL 105  --  103 106  CO2 24  --  27 27  GLUCOSE 113*  --  248* 100*  BUN 10  --  14 15  CREATININE 0.38* 0.57 0.74 0.70  CALCIUM  8.8*  --  8.8* 8.8*  MG  --   --   --  1.8   Liver Function Tests: Recent Labs  Lab 09/06/23 1311 09/07/23 0504 09/08/23 0509  AST 90* 50* 27  ALT 287* 204* 149*  ALKPHOS 140* 114 99  BILITOT 0.9 0.7 0.8  PROT 6.3* 5.9* 6.0*  ALBUMIN 3.1* 2.8* 2.8*   No results for input(s): LIPASE, AMYLASE in the last 168 hours. No results for input(s): AMMONIA in the last 168 hours. CBC: Recent Labs  Lab 09/06/23 1311 09/06/23 1731 09/07/23 0504  WBC 8.0 8.8 7.9  NEUTROABS 5.4  --   --   HGB 11.8* 12.4 11.0*  HCT 39.1 40.0 35.7*  MCV 89.3 89.1 89.3  PLT 467* 521* 433*   Cardiac Enzymes: No results for input(s): CKTOTAL, CKMB, CKMBINDEX, TROPONINI in the last 168 hours. BNP: BNP (last 3 results) Recent Labs    09/06/23 1501  BNP 937.1*    ProBNP (last 3 results) Recent Labs    07/18/23 2222  PROBNP 2,515.0*    CBG: Recent Labs  Lab 09/07/23 1628 09/07/23 2011 09/08/23 0732 09/08/23 1133 09/08/23 1639  GLUCAP 214* 150* 112* 134* 155*       Signed:  Toribio Hummer MD.  [facility] 09/08/2023, 5:41 PM

## 2023-09-08 NOTE — Progress Notes (Incomplete)
 PROGRESS NOTE    Natasha Hudson  FMW:[id number] DOB: Nov 30, 1965 DOA: 09/06/2023 PCP: Wheeler Harlene CROME, NP (Confirm with patient/family/NH records and if not entered, this HAS to be entered at [facility] point of entry. No PCP if truly none.)   Chief Complaint  Patient presents with   Cough    Brief Narrative: (Start on day 1 of progress note - keep it brief and live) ***   Assessment & Plan:   Principal Problem:   CHF exacerbation (HCC)   ***   DVT prophylaxis: (Lovenox /Heparin /SCD's/anticoagulated/None (if comfort care) Code Status: (Full/Partial - specify details) Family Communication: (Specify name, relationship & date discussed. NO discussed with patient) Disposition:   Status is: Inpatient {Inpatient:23812}   Consultants:  ***  Procedures: (Don't include imaging studies which can be auto populated. Include things that cannot be auto populated i.e. Echo, Carotid and venous dopplers, Foley, Bipap, HD, tubes/drains, wound vac, central lines etc) ***  Antimicrobials: (specify start and planned stop date. Auto populated tables are space occupying and do not give end dates) ***    Subjective: Patient feeling much better.  Was hoping to be able to be discharged today.  Feels shortness of breath has improved and close to baseline.  States ambulated in hallway and did not feel significantly short of breath.  Some improvement with lower extremity edema.  Objective: Vitals:   09/08/23 0435 09/08/23 0500 09/08/23 0948 09/08/23 1137  BP: (!) 135/95   136/87  Pulse: 96   95  Resp: 16   19  Temp: 98.4 F (36.9 C)   98 F (36.7 C)  TempSrc: Oral   Oral  SpO2: 92%  96% 100%  Weight:  68 kg    Height:        Intake/Output Summary (Last 24 hours) at 09/08/2023 1215 Last data filed at 09/08/2023 1039 Gross per 24 hour  Intake 240 ml  Output 500 ml  Net -260 ml   Filed Weights   09/06/23 1701 09/07/23 0500 09/08/23 0500  Weight: 67.2 kg 67.3 kg 68 kg     Examination:  General exam: Appears calm and comfortable  Respiratory system: Clear to auscultation. Respiratory effort normal. Cardiovascular system: S1 & S2 heard, RRR. No JVD, murmurs, rubs, gallops or clicks. No pedal edema. Gastrointestinal system: Abdomen is nondistended, soft and nontender. No organomegaly or masses felt. Normal bowel sounds heard. Central nervous system: Alert and oriented. No focal neurological deficits. Extremities: Symmetric 5 x 5 power. Skin: No rashes, lesions or ulcers Psychiatry: Judgement and insight appear normal. Mood & affect appropriate.     Data Reviewed: I have personally reviewed following labs and imaging studies  CBC: Recent Labs  Lab 09/06/23 1311 09/06/23 1731 09/07/23 0504  WBC 8.0 8.8 7.9  NEUTROABS 5.4  --   --   HGB 11.8* 12.4 11.0*  HCT 39.1 40.0 35.7*  MCV 89.3 89.1 89.3  PLT 467* 521* 433*    Basic Metabolic Panel: Recent Labs  Lab 09/06/23 1311 09/06/23 1731 09/07/23 0504 09/08/23 0509  NA 139  --  139 141  K 3.5  --  3.5 3.8  CL 105  --  103 106  CO2 24  --  27 27  GLUCOSE 113*  --  248* 100*  BUN 10  --  14 15  CREATININE 0.38* 0.57 0.74 0.70  CALCIUM  8.8*  --  8.8* 8.8*  MG  --   --   --  1.8    GFR: Estimated Creatinine Clearance: 67.3  mL/min (by C-G formula based on SCr of 0.7 mg/dL).  Liver Function Tests: Recent Labs  Lab 09/06/23 1311 09/07/23 0504 09/08/23 0509  AST 90* 50* 27  ALT 287* 204* 149*  ALKPHOS 140* 114 99  BILITOT 0.9 0.7 0.8  PROT 6.3* 5.9* 6.0*  ALBUMIN 3.1* 2.8* 2.8*    CBG: Recent Labs  Lab 09/07/23 1126 09/07/23 1628 09/07/23 2011 09/08/23 0732 09/08/23 1133  GLUCAP 146* 214* 150* 112* 134*     No results found for this or any previous visit (from the past 240 hours).       Radiology Studies: DG Chest 2 View Result Date: 09/06/2023 CLINICAL DATA:  Cough. EXAM: CHEST - 2 VIEW COMPARISON:  07/20/2023 and CT chest 07/18/2023. FINDINGS: Trachea is  midline. Heart is enlarged, stable. Patchy left perihilar opacification with moderate bilateral pleural effusions and bibasilar mixed interstitial and airspace opacification. IMPRESSION: 1. Congestive heart failure. 2. Probable bibasilar atelectasis. 3. Difficult to exclude left perihilar pneumonia. Electronically Signed   By: Newell Eke M.D.   On: 09/06/2023 13:43        Scheduled Meds:  aspirin  EC  81 mg Oral Daily   empagliflozin   10 mg Oral Daily   enoxaparin  (LOVENOX ) injection  40 mg Subcutaneous Daily   furosemide   40 mg Intravenous BID   insulin  aspart  0-15 Units Subcutaneous TID WC   insulin  aspart  0-5 Units Subcutaneous QHS   insulin  glargine-yfgn  15 Units Subcutaneous Daily   losartan   50 mg Oral Daily   pantoprazole  40 mg Oral Daily   rosuvastatin   20 mg Oral Daily   spironolactone   12.5 mg Oral q morning   Continuous Infusions:   LOS: 2 days    Time spent: ***    Toribio Hummer, MD [facility]   To contact the attending provider between 7A-7P or the covering provider during after hours 7P-7A, please log into the web site [email or web address] and access using universal [place] password for that web site. If you do not have the password, please call the hospital operator.  09/08/2023, 12:15 PM

## 2023-09-08 NOTE — Progress Notes (Signed)
 TOC meds in a secure bag delivered to pt in room by this RN.

## 2023-09-09 ENCOUNTER — Telehealth: Payer: Self-pay

## 2023-09-09 NOTE — Transitions of Care (Post Inpatient/ED Visit) (Signed)
   09/09/2023  Name: Anastacia Reinecke MRN: [id number] DOB: [birth year]  Today's TOC FU Call Status: Today's TOC FU Call Status:: Unsuccessful Call (1st Attempt) Unsuccessful Call (1st Attempt) Date: 09/09/23  Attempted to reach the patient regarding the most recent Inpatient/ED visit.  Follow Up Plan: Additional outreach attempts will be made to reach the patient to complete the Transitions of Care (Post Inpatient/ED visit) call.   Judaea Burgoon J. Amala Petion RN, MSN [facility], [facility] RN Care Manager Direct Dial: [phone]  Fax: [phone] Website: [email or web address]

## 2023-09-14 ENCOUNTER — Telehealth: Payer: Self-pay

## 2023-09-14 NOTE — Transitions of Care (Post Inpatient/ED Visit) (Signed)
   09/14/2023  Name: Natasha Hudson MRN: [id number] DOB: [birth year]  Today's TOC FU Call Status: Today's TOC FU Call Status:: Unsuccessful Call (2nd Attempt) Unsuccessful Call (1st Attempt) Date: 09/09/23 Unsuccessful Call (2nd Attempt) Date: 09/14/23  Attempted to reach the patient regarding the most recent Inpatient/ED visit.  Follow Up Plan: Additional outreach attempts will be made to reach the patient to complete the Transitions of Care (Post Inpatient/ED visit) call.   Medford Balboa, BSN, RN [place]  VBCI - [facility] RN Care Manager [phone]

## 2023-09-14 NOTE — Transitions of Care (Post Inpatient/ED Visit) (Signed)
 09/14/2023  Name: Natasha Hudson MRN: [id number] DOB: 01-07-1966  Today's TOC FU Call Status: Today's TOC FU Call Status:: Successful TOC FU Call Completed TOC FU Call Complete Date: 09/14/23 Patient's Name and Date of Birth confirmed.  Transition Care Management Follow-up Telephone Call Date of Discharge: 09/08/23 Discharge Facility: [facility]) Type of Discharge: Inpatient Admission Primary Inpatient Discharge Diagnosis:: Congestive Heart Failure How have you been since you were released from the hospital?: Better Any questions or concerns?: No  Items Reviewed: Did you receive and understand the discharge instructions provided?: Yes Medications obtained,verified, and reconciled?: Yes (Medications Reviewed) Any new allergies since your discharge?: No Dietary orders reviewed?: Yes Type of Diet Ordered:: Low Sodium Heart Healthy Do you have support at home?: Yes People in Home [RPT]: child(ren), adult Name of Support/Comfort Primary Source: Littie Hahn  Medications Reviewed Today: Medications Reviewed Today     Reviewed by Moises Reusing, RN (Case Manager) on 09/14/23 at 1603  Med List Status: <None>   Medication Order Taking? Sig Documenting Provider Last Dose Status Informant  albuterol  (VENTOLIN  HFA) 108 (90 Base) MCG/ACT inhaler [id number] Yes Inhale 1-2 puffs into the lungs every 4 (four) hours as needed for wheezing or shortness of breath.  Patient taking differently: Inhale 1-2 puffs into the lungs every 3 (three) hours as needed for wheezing or shortness of breath.   Yacopino, Jessica L, NP  Active Self  BAYER LOW DOSE 81 MG tablet [id number] Yes Take 81 mg by mouth in the morning. Swallow whole. [provider]  Active Self  Blood Glucose Monitoring Suppl (BLOOD GLUCOSE MONITOR SYSTEM) w/Device KIT [id number] Yes Use to check blood sugar as directed Rojelio Nest, DO  Active   empagliflozin  (JARDIANCE ) 10 MG TABS tablet [id number] Yes Take 1 tablet (10 mg  total) by mouth daily. Sebastian Toribio GAILS, MD  Active   fluticasone  (FLONASE ) 50 MCG/ACT nasal spray [id number] Yes Place 1 spray into both nostrils daily.  Patient taking differently: Place 1 spray into both nostrils daily as needed for allergies or rhinitis.   Yacopino, Jessica L, NP  Active Self  furosemide  (LASIX ) 20 MG tablet [id number] Yes Take 1 tablet (20 mg total) by mouth daily. Sebastian Toribio GAILS, MD  Active   Glucose Blood (BLOOD GLUCOSE TEST STRIPS) STRP [id number] Yes Use to check blood sugar three times daily Wheeler Raisin L, NP  Active   insulin  aspart (NOVOLOG ) 100 UNIT/ML FlexPen [id number] Yes Inject 0-6 Units into the skin 3 (three) times daily with meals. Check Blood Glucose (BG) and inject per scale: BG <150= 0 unit; BG 150-200= 1 unit; BG 201-250= 2 unit; BG 251-300= 3 unit; BG 301-350= 4 unit; BG 351-400= 5 unit; BG >400= 6 unit and Call Primary Care. Yacopino, Jessica L, NP  Active Self  insulin  glargine-yfgn (SEMGLEE ) 100 UNIT/ML Pen [id number] Yes Inject 15 Units into the skin daily. Wheeler Raisin CROME, NP  Active Self  Insulin  Pen Needle (PEN NEEDLES) 31G X 5 MM MISC [id number] Yes Use to inject insulin  3 times daily Rojelio Nest, DO  Active   Lancet Device MISC [id number] Yes 1 each by Does not apply route 3 (three) times daily. May dispense any manufacturer covered by patient's insurance. Rojelio Nest, DO  Active   Lancets MISC [id number] Yes Use to check blood sugar three times daily Rojelio Nest, DO  Active   losartan  (COZAAR ) 50 MG tablet [id number] Yes Take 1 tablet (50 mg total) by mouth at bedtime. Sebastian Toribio GAILS, MD  Active   omeprazole  (PRILOSEC) 20 MG capsule [id number] Yes Take 2 capsules (40 mg total) by mouth daily. Sebastian Toribio GAILS, MD  Active   rosuvastatin  (CRESTOR ) 20 MG tablet [id number] Yes Take 1 tablet (20 mg total) by mouth daily. Sebastian Toribio GAILS, MD  Active   spironolactone  (ALDACTONE ) 25 MG tablet [id number] Yes Take 0.5 tablets (12.5 mg  total) by mouth every morning. Sebastian Toribio GAILS, MD  Active             Home Care and Equipment/Supplies: Were Home Health Services Ordered?: NA Any new equipment or medical supplies ordered?: NA  Functional Questionnaire: Do you need assistance with bathing/showering or dressing?: No Do you need assistance with meal preparation?: No Do you have difficulty maintaining continence: No Do you need assistance with getting out of bed/getting out of a chair/moving?: No Do you have difficulty managing or taking your medications?: No  Follow up appointments reviewed: PCP Follow-up appointment confirmed?: Yes Date of PCP follow-up appointment?: 09/20/23 Follow-up Provider: Harlene Jolly Specialist [facility] Follow-up appointment confirmed?: NA Do you need transportation to your follow-up appointment?: No Do you understand care options if your condition(s) worsen?: Yes-patient verbalized understanding  SDOH Interventions Today    Flowsheet Row Most Recent Value  SDOH Interventions   Food Insecurity Interventions Intervention Not Indicated  Housing Interventions Intervention Not Indicated  Transportation Interventions Intervention Not Indicated  Utilities Interventions Intervention Not Indicated    Medford Balboa, BSN, RN [place]  VBCI - [facility] RN Care Manager [phone]

## 2023-09-20 ENCOUNTER — Ambulatory Visit (INDEPENDENT_AMBULATORY_CARE_PROVIDER_SITE_OTHER)

## 2023-09-20 ENCOUNTER — Other Ambulatory Visit: Payer: Self-pay | Admitting: Neurology

## 2023-09-20 DIAGNOSIS — I10 Essential (primary) hypertension: Secondary | ICD-10-CM

## 2023-09-20 NOTE — Progress Notes (Signed)
 Pt here for Blood pressure check per JY  Pt currently takes: Losartan  50mg ; once daily   Pt reports compliance with medication.   BP today @ = 132/82 HR = 101  Pt advised to schedule hosp f/u with pcp. Pt scheduled for Friday afternoon.

## 2023-09-22 NOTE — Progress Notes (Unsigned)
 Subjective:     Patient ID: Natasha Hudson, female    DOB: September 02, 1965, 58 y.o.   MRN: [id number]  No chief complaint on file.   HPI Natasha Hudson presents for follow-up, following hospital discharge.  Hospitalized 09/06/2023 to 09/08/2023 for heart failure exacerbation.  Cardiology appointment scheduled for September 2025.  Type 2 Diabetes Empagliflozin  (Jardiance ) 10 mg daily, Insulin  Aspart (NovoLog ), Insulin  glargine pen- 15 u daily Reports compliant with medications Fasting AM BS-***  Denies hypoglycemic events, polyuria, polydipsia, or polyphagia. Denies unexplained weight changes, visual changes, or neuropathy symptoms. Denies foot ulcers, wounds, or pain  Hypertension Lasix  20 mg daily Spironolactone  disease (Aldactone ) 12.5 mg daily losartan  (Cozaar ) 25 mg at bedtime Compliant with medications BPs at home 140s/90s  CHF Recent Echo 08/2023: LVEF- 25-30%.  She was referred to cardiology- no show for June 6th appointment.  Hyperlipidemia Rosuvastatin  (Crestor ) 20 mg daily, compliant Lab Results  Component Value Date   CHOL 152 07/20/2023   HDL 41 07/20/2023   LDLCALC 96 07/20/2023   TRIG 73 07/20/2023   CHOLHDL 3.7 07/20/2023    Vision: Seen by ophthalmology  Patient denies fever, chills, SOB, CP, palpitations, dyspnea, HA, vision changes, N/V/D, abdominal pain, urinary symptoms, rash, weight changes, and recent illness or hospitalizations.   History of Present Illness              Health Maintenance Due  Topic Date Due   FOOT EXAM  Never done   Diabetic kidney evaluation - Urine ACR  Never done   Hepatitis C Screening  Never done   Colonoscopy  Never done   DTaP/Tdap/Td (2 - Tdap) 02/01/2012   MAMMOGRAM  Never done   Pneumococcal Vaccine 64-59 Years old (2 of 2 - PCV) 02/06/2016    Past Medical History:  Diagnosis Date   Diabetes mellitus without complication (HCC)    Hypertension     Past Surgical History:  Procedure Laterality Date    RIGHT/LEFT HEART CATH AND CORONARY ANGIOGRAPHY N/A 07/23/2023   Procedure: RIGHT/LEFT HEART CATH AND CORONARY ANGIOGRAPHY;  Surgeon: Wonda Sharper, MD;  [facility] CV LAB;  Service: Cardiovascular;  Laterality: N/A;   TRANSESOPHAGEAL ECHOCARDIOGRAM (CATH LAB) N/A 07/26/2023   Procedure: TRANSESOPHAGEAL ECHOCARDIOGRAM;  Surgeon: Francyne Headland, MD;  Location: [facility] INVASIVE CV LAB;  Service: Cardiovascular;  Laterality: N/A;    Family History  Problem Relation Age of Onset   Hypertension Mother    Diabetes Mother    Diabetes Sister    Diabetes Brother    Diabetes Other    Hypertension Other     Social History   Socioeconomic History   Marital status: Single    Spouse name: Not on file   Number of children: Not on file   Years of education: Not on file   Highest education level: Not on file  Occupational History   Not on file  Tobacco Use   Smoking status: Never   Smokeless tobacco: Never  Vaping Use   Vaping status: Never Used  Substance and Sexual Activity   Alcohol use: No   Drug use: No   Sexual activity: Not on file  Other Topics Concern   Not on file  Social History Narrative   Not on file   Social Drivers of Health   Financial Resource Strain: Not on file  Food Insecurity: No Food Insecurity (09/14/2023)   Hunger Vital Sign    Worried About Running Out of Food in the Last Year: Never true  Ran Out of Food in the Last Year: Never true  Transportation Needs: No Transportation Needs (09/14/2023)   PRAPARE - Administrator, Civil Service (Medical): No    Lack of Transportation (Non-Medical): No  Physical Activity: Not on file  Stress: Not on file  Social Connections: Unknown (07/19/2023)   Social Connection and Isolation Panel    Frequency of Communication with Friends and Family: Three times a week    Frequency of Social Gatherings with Friends and Family: Three times a week    Attends Religious Services: 1 to 4 times per year    Active  Member of Clubs or Organizations: No    Attends Banker Meetings: Never    Marital Status: Patient declined  Intimate Partner Violence: Not At Risk (09/14/2023)   Humiliation, Afraid, Rape, and Kick questionnaire    Fear of Current or Ex-Partner: No    Emotionally Abused: No    Physically Abused: No    Sexually Abused: No    Outpatient Medications Prior to Visit  Medication Sig Dispense Refill   albuterol  (VENTOLIN  HFA) 108 (90 Base) MCG/ACT inhaler Inhale 1-2 puffs into the lungs every 4 (four) hours as needed for wheezing or shortness of breath. (Patient taking differently: Inhale 1-2 puffs into the lungs every 3 (three) hours as needed for wheezing or shortness of breath.) 8 g 2   BAYER LOW DOSE 81 MG tablet Take 81 mg by mouth in the morning. Swallow whole.     Blood Glucose Monitoring Suppl (BLOOD GLUCOSE MONITOR SYSTEM) w/Device KIT Use to check blood sugar as directed 1 kit 0   empagliflozin  (JARDIANCE ) 10 MG TABS tablet Take 1 tablet (10 mg total) by mouth daily. 30 tablet 2   fluticasone  (FLONASE ) 50 MCG/ACT nasal spray Place 1 spray into both nostrils daily. (Patient taking differently: Place 1 spray into both nostrils daily as needed for allergies or rhinitis.) 16 g 1   furosemide  (LASIX ) 20 MG tablet Take 1 tablet (20 mg total) by mouth daily. 30 tablet 2   Glucose Blood (BLOOD GLUCOSE TEST STRIPS) STRP Use to check blood sugar three times daily 100 strip 0   insulin  aspart (NOVOLOG ) 100 UNIT/ML FlexPen Inject 0-6 Units into the skin 3 (three) times daily with meals. Check Blood Glucose (BG) and inject per scale: BG <150= 0 unit; BG 150-200= 1 unit; BG 201-250= 2 unit; BG 251-300= 3 unit; BG 301-350= 4 unit; BG 351-400= 5 unit; BG >400= 6 unit and Call Primary Care. 15 mL 1   insulin  glargine-yfgn (SEMGLEE ) 100 UNIT/ML Pen Inject 15 Units into the skin daily. 15 mL 1   Insulin  Pen Needle (PEN NEEDLES) 31G X 5 MM MISC Use to inject insulin  3 times daily 100 each 0    Lancet Device MISC 1 each by Does not apply route 3 (three) times daily. May dispense any manufacturer covered by patient's insurance. 1 each 0   Lancets MISC Use to check blood sugar three times daily 100 each 0   losartan  (COZAAR ) 50 MG tablet Take 1 tablet (50 mg total) by mouth at bedtime. 30 tablet 2   omeprazole  (PRILOSEC) 20 MG capsule Take 2 capsules (40 mg total) by mouth daily. 60 capsule 2   rosuvastatin  (CRESTOR ) 20 MG tablet Take 1 tablet (20 mg total) by mouth daily. 30 tablet 2   spironolactone  (ALDACTONE ) 25 MG tablet Take 0.5 tablets (12.5 mg total) by mouth every morning. 45 tablet 1   No facility-administered  medications prior to visit.    Allergies  Allergen Reactions   Metformin Diarrhea and Other (See Comments)    Only with a high dose (diarrhea)    ROS See HPI    Objective:    Physical Exam  General: No acute distress. Awake and conversant.  Eyes: Normal conjunctiva, anicteric. Round symmetric pupils.  ENT: Hearing grossly intact.  No nasal drainage. Respiratory: CTAB. Respirations are non-labored. No wheezing.  Skin: Warm. No rashes or ulcers.  Psych: Alert and oriented. Cooperative, Appropriate mood and affect, Normal judgment.  CV: RRR.  +1 Bilateral LE edema MSK: Normal ambulation. No clubbing or cyanosis.     LMP 10/03/2012  Wt Readings from Last 3 Encounters:  09/14/23 146 lb (66.2 kg)  09/08/23 149 lb 14.6 oz (68 kg)  09/03/23 153 lb (69.4 kg)       Assessment & Plan:   Problem List Items Addressed This Visit     Essential hypertension   Type 2 diabetes mellitus with hyperglycemia [facility])   Other Visit Diagnoses       Hospital discharge follow-up    -  Primary      Patient was educated on the diagnosis, treatment options, potential risks, benefits, and alternatives. All questions were addressed. Patient verbalized understanding and agrees with the plan of care. Will follow up as advised or sooner if symptoms worsen or new concerns  arise.   Portions of this note were dictated using DRAGON voice recognition software. Please disregard any errors in transcription.     I am having [name] maintain her Blood Glucose Monitor System, Lancet Device, Lancets, Pen Needles, fluticasone , albuterol , BLOOD GLUCOSE TEST STRIPS, insulin  glargine-yfgn, insulin  aspart, Bayer Low Dose, losartan , rosuvastatin , spironolactone , empagliflozin , omeprazole , and furosemide .  No orders of the defined types were placed in this encounter.

## 2023-09-23 ENCOUNTER — Encounter: Payer: Self-pay | Admitting: Student

## 2023-09-23 ENCOUNTER — Other Ambulatory Visit (HOSPITAL_COMMUNITY): Payer: Self-pay

## 2023-09-24 ENCOUNTER — Encounter: Payer: Self-pay | Admitting: Student

## 2023-09-24 ENCOUNTER — Other Ambulatory Visit (HOSPITAL_COMMUNITY): Payer: Self-pay

## 2023-09-24 ENCOUNTER — Ambulatory Visit: Admitting: Student

## 2023-09-24 VITALS — BP 158/88 | HR 94 | Temp 98.0°F | Ht 65.0 in | Wt 150.2 lb

## 2023-09-24 DIAGNOSIS — R7989 Other specified abnormal findings of blood chemistry: Secondary | ICD-10-CM

## 2023-09-24 DIAGNOSIS — Z09 Encounter for follow-up examination after completed treatment for conditions other than malignant neoplasm: Secondary | ICD-10-CM

## 2023-09-24 DIAGNOSIS — E1165 Type 2 diabetes mellitus with hyperglycemia: Secondary | ICD-10-CM

## 2023-09-24 DIAGNOSIS — I509 Heart failure, unspecified: Secondary | ICD-10-CM

## 2023-09-24 DIAGNOSIS — I10 Essential (primary) hypertension: Secondary | ICD-10-CM

## 2023-09-24 DIAGNOSIS — Z794 Long term (current) use of insulin: Secondary | ICD-10-CM

## 2023-09-24 LAB — CBC WITH DIFFERENTIAL/PLATELET
Absolute Lymphocytes: 2938 {cells}/uL (ref 850–3900)
Absolute Monocytes: 772 {cells}/uL (ref 200–950)
Basophils Absolute: 42 {cells}/uL (ref 0–200)
Basophils Relative: 0.5 %
Eosinophils Absolute: 58 {cells}/uL (ref 15–500)
Eosinophils Relative: 0.7 %
HCT: 39.3 % (ref 35.0–45.0)
Hemoglobin: 12 g/dL (ref 11.7–15.5)
MCH: 27 pg (ref 27.0–33.0)
MCHC: 30.5 g/dL — ABNORMAL LOW (ref 32.0–36.0)
MCV: 88.3 fL (ref 80.0–100.0)
MPV: 11.3 fL (ref 7.5–12.5)
Monocytes Relative: 9.3 %
Neutro Abs: 4490 {cells}/uL (ref 1500–7800)
Neutrophils Relative %: 54.1 %
Platelets: 457 Thousand/uL — ABNORMAL HIGH (ref 140–400)
RBC: 4.45 Million/uL (ref 3.80–5.10)
RDW: 13.8 % (ref 11.0–15.0)
Total Lymphocyte: 35.4 %
WBC: 8.3 Thousand/uL (ref 3.8–10.8)

## 2023-09-24 NOTE — Assessment & Plan Note (Addendum)
 Recently discharged from hospital for CHF exacerbation.  EF 25 to 30% . Scheduled for upcoming appointment with cardiology.  Patient was educated on recognizing early signs of CHF exacerbation, including increased SOB, swelling in the legs or feet, fatigue, and sudden weight gain. Emphasized the importance of daily weight monitoring at the same time each morning after voiding and before eating. RTC for weight gain of more than 2-3 pounds in a day or 5 pounds in a week, as this may indicate fluid retention. Patient voiced understanding. Follow-up with cardiology.

## 2023-09-24 NOTE — Assessment & Plan Note (Signed)
 Currently taking Lasix  20 mg daily, losartan  50 mg daily, spironolactone  12.5 mg daily.  BP elevated at office visit today, possible to be whitecoat HTN.  Pt reports she has been taking BP at home and it has been 120s-130s SBP 70-80 DBP. Encouraged DASH diet, minimize caffeine and obtain adequate sleep. Report concerning symptoms and follow up as directed and as needed

## 2023-09-24 NOTE — Assessment & Plan Note (Signed)
 Repeat CBC.  FOBT ordered.

## 2023-09-24 NOTE — Assessment & Plan Note (Addendum)
 Not well controlled. Recheck A1C today. Discussed option of a referral for diabetic nutrition and education, Pt declined at this time. She reports checking her blood sugars  more regularly, BS appears to be somewhat more controlled based on Pt reports. Re-educated Pt on on LA insulin  titration; the patient voiced understanding, and all questions were addressed. Minimize simple carbs. Increase exercise as tolerated. Continue current meds.

## 2023-09-25 LAB — HEMOGLOBIN A1C
Hgb A1c MFr Bld: 9.1 % — ABNORMAL HIGH (ref <5.7)
Mean Plasma Glucose: 214 mg/dL
eAG (mmol/L): 11.9 mmol/L

## 2023-09-25 LAB — COMPREHENSIVE METABOLIC PANEL WITH GFR
AG Ratio: 1.5 (calc) (ref 1.0–2.5)
ALT: 47 U/L — ABNORMAL HIGH (ref 6–29)
AST: 30 U/L (ref 10–35)
Albumin: 4.1 g/dL (ref 3.6–5.1)
Alkaline phosphatase (APISO): 66 U/L (ref 37–153)
BUN: 21 mg/dL (ref 7–25)
CO2: 28 mmol/L (ref 20–32)
Calcium: 9.7 mg/dL (ref 8.6–10.4)
Chloride: 104 mmol/L (ref 98–110)
Creat: 0.88 mg/dL (ref 0.50–1.05)
Globulin: 2.8 g/dL (ref 1.9–3.7)
Glucose, Bld: 61 mg/dL — ABNORMAL LOW (ref 65–99)
Potassium: 3.5 mmol/L (ref 3.5–5.3)
Sodium: 141 mmol/L (ref 135–146)
Total Bilirubin: 0.4 mg/dL (ref 0.2–1.2)
Total Protein: 6.9 g/dL (ref 6.1–8.1)
eGFR: 75 mL/min/1.73m2 (ref >=60)

## 2023-09-25 LAB — MICROALBUMIN / CREATININE URINE RATIO
Creatinine, Urine: 112 mg/dL (ref 20–275)
Microalb Creat Ratio: 55 mg/g{creat} — ABNORMAL HIGH (ref <30)
Microalb, Ur: 6.2 mg/dL

## 2023-09-25 LAB — RENAL FUNCTION PANEL
Albumin: 4.1 g/dL (ref 3.6–5.1)
BUN: 21 mg/dL (ref 7–25)
CO2: 28 mmol/L (ref 20–32)
Calcium: 9.7 mg/dL (ref 8.6–10.4)
Chloride: 104 mmol/L (ref 98–110)
Creat: 0.88 mg/dL (ref 0.50–1.05)
Glucose, Bld: 61 mg/dL — ABNORMAL LOW (ref 65–99)
Phosphorus: 4.3 mg/dL (ref 2.5–4.5)
Potassium: 3.5 mmol/L (ref 3.5–5.3)
Sodium: 141 mmol/L (ref 135–146)

## 2023-09-27 ENCOUNTER — Ambulatory Visit: Payer: Self-pay | Admitting: Student

## 2023-09-27 DIAGNOSIS — E1165 Type 2 diabetes mellitus with hyperglycemia: Secondary | ICD-10-CM

## 2023-09-27 DIAGNOSIS — R7989 Other specified abnormal findings of blood chemistry: Secondary | ICD-10-CM

## 2023-09-30 ENCOUNTER — Other Ambulatory Visit: Payer: Self-pay | Admitting: Student

## 2023-09-30 DIAGNOSIS — E1165 Type 2 diabetes mellitus with hyperglycemia: Secondary | ICD-10-CM

## 2023-09-30 NOTE — Progress Notes (Signed)
 Open chart, entered in error.

## 2023-09-30 NOTE — Progress Notes (Deleted)
 Opened chart, entered in error.

## 2023-10-08 ENCOUNTER — Other Ambulatory Visit (HOSPITAL_COMMUNITY): Payer: Self-pay

## 2023-10-08 ENCOUNTER — Other Ambulatory Visit: Payer: Self-pay

## 2023-10-09 ENCOUNTER — Other Ambulatory Visit (HOSPITAL_COMMUNITY): Payer: Self-pay

## 2023-10-12 ENCOUNTER — Other Ambulatory Visit (HOSPITAL_BASED_OUTPATIENT_CLINIC_OR_DEPARTMENT_OTHER): Payer: Self-pay

## 2023-10-12 ENCOUNTER — Other Ambulatory Visit (HOSPITAL_COMMUNITY): Payer: Self-pay

## 2023-10-18 ENCOUNTER — Ambulatory Visit
Admission: RE | Admit: 2023-10-18 | Discharge: 2023-10-18 | Disposition: A | Discharge status: Home / Self Care | Source: Ambulatory Visit | Attending: Student | Admitting: Student

## 2023-10-18 ENCOUNTER — Ambulatory Visit: Payer: Self-pay

## 2023-10-18 VITALS — BP 128/86 | HR 102 | Temp 98.3°F | Resp 17

## 2023-10-18 DIAGNOSIS — I509 Heart failure, unspecified: Secondary | ICD-10-CM | POA: Diagnosis not present

## 2023-10-18 NOTE — Telephone Encounter (Signed)
 FYI Only or Action Required?: FYI only for provider.  Patient was last seen in primary care on 09/24/2023 by Wheeler Harlene CROME, NP.  Called Nurse Triage reporting Shortness of Breath and Foot Swelling.  Symptoms began several days ago.  Interventions attempted: Prescription medications: spironolactone  (ALDACTONE ).  Symptoms are: unchanged.  Triage Disposition: See HCP Within 4 Hours (Or PCP Triage)  Patient/caregiver understands and will follow disposition?: Yes      Copied from CRM #[id number]. Topic: Clinical - Red Word Triage >> Oct 18, 2023 11:35 AM [place] J wrote: [facility] that prompted transfer to Nurse Triage: Patient is having shortness of breathe and swelling in her feet. ----------------------------------------------------------------------- From previous Reason for Contact - Scheduling: Patient/patient representative is calling to schedule an appointment. Refer to attachments for appointment information. Reason for Disposition  [1] Longstanding difficulty breathing AND [2] not responding to usual therapy  Answer Assessment - Initial Assessment Questions 1. RESPIRATORY STATUS: Describe your breathing? (e.g., wheezing, shortness of breath, unable to speak, severe coughing)      SOB, bilateral feet feet swelling 2. ONSET: When did this breathing problem begin?      Last week 3. PATTERN Does the difficult breathing come and go, or has it been constant since it started?      Comes and goes 4. SEVERITY: How bad is your breathing? (e.g., mild, moderate, severe)      Mild - with exertion/speaking  5. RECURRENT SYMPTOM: Have you had difficulty breathing before? If Yes, ask: When was the last time? and What happened that time?      Yes, July - reports taking water pills 6. CARDIAC HISTORY: Do you have any history of heart disease? (e.g., heart attack, angina, bypass surgery, angioplasty)      CHF - daughter reports water pill may need to be adjusted 7. LUNG  HISTORY: Do you have any history of lung disease?  (e.g., pulmonary embolus, asthma, emphysema)     Asthma Denies other sx 8. CAUSE: What do you think is causing the breathing problem?      CHF 9. OTHER SYMPTOMS: Do you have any other symptoms? (e.g., chest pain, cough, dizziness, fever, runny nose)     denies 10. O2 SATURATION MONITOR:  Do you use an oxygen saturation monitor (pulse oximeter) at home? If Yes, ask: What is your reading (oxygen level) today? What is your usual oxygen saturation reading? (e.g., 95%)       N/a 11. PREGNANCY: Is there any chance you are pregnant? When was your last menstrual period?       N/a 12. TRAVEL: Have you traveled out of the country in the last month? (e.g., travel history, exposures)       N/a    [facility] attempted to schedule with PCP, but no access. Scheduled at [facility] instead.  Protocols used: Breathing Difficulty-A-AH

## 2023-10-18 NOTE — ED Triage Notes (Addendum)
 Pt presents due to bilateral leg swelling and sob.  Pt dx with CHF in July and is on lasix  and spirolactone. She has had consistent leg swelling since then but sob is worse this morning. Cardiology visit isn't until September.   Denies chest pain

## 2023-10-18 NOTE — Discharge Instructions (Signed)
  1. Acute on chronic congestive heart failure, unspecified heart failure type (HCC) (Primary) - Due to cardiac history and significant concern for CHF exacerbation causing symptoms of shortness of breath recommend follow-up in emergency department immediately for further evaluation and management. - Management of chronic congestive heart failure is beyond the means of urgent care for treatment due to need for stat laboratory testing, advanced imaging and IV medications that are unavailable in urgent care setting. - As discussed please go to [facility] emergency department after leaving urgent care for evaluation and management of suspected CHF exacerbation.

## 2023-10-18 NOTE — ED Provider Notes (Signed)
 [facility]  Note:  This document was prepared using Dragon voice recognition software and may include unintentional dictation errors.  MRN: [id number] DOB: 07-10-1965  Subjective:   Natasha Hudson is a 58 y.o. female presenting for bilateral lower extremity swelling and shortness of breath.  Patient reports that she has history of CHF was diagnosed in July and put on Lasix  and spironolactone .  Patient states since this weekend she noticed more consistent leg swelling that was not getting better and progressive shortness of breath.  Patient states that breathing difficulty is usually worse first thing in the morning.  Patient has no chest pain, weakness, dizziness but has significant exertional dyspnea.  Patient states her next appointment with cardiology is not until September.  Patient usually goes [facility] for [facility] treatment.  Patient states she has taken her Lasix  and spironolactone  as recommended.  Patient denies any fever, cough, sore throat, body aches, abdominal pain, nausea/vomiting or diarrhea.  No current facility-administered medications for this encounter.  Current Outpatient Medications:    ACCU-CHEK GUIDE TEST test strip, USE TO CHECK BLOOD SUGAR THREE TIMES DAILY, Disp: 100 strip, Rfl: 0   albuterol  (VENTOLIN  HFA) 108 (90 Base) MCG/ACT inhaler, Inhale 1-2 puffs into the lungs every 4 (four) hours as needed for wheezing or shortness of breath. (Patient taking differently: Inhale 1-2 puffs into the lungs every 3 (three) hours as needed for wheezing or shortness of breath.), Disp: 8 g, Rfl: 2   BAYER LOW DOSE 81 MG tablet, Take 81 mg by mouth in the morning. Swallow whole., Disp: , Rfl:    Blood Glucose Monitoring Suppl (BLOOD GLUCOSE MONITOR SYSTEM) w/Device KIT, Use to check blood sugar as directed, Disp: 1 kit, Rfl: 0   empagliflozin  (JARDIANCE ) 10 MG TABS tablet, Take 1 tablet (10 mg total) by mouth daily., Disp: 30 tablet, Rfl: 2   fluticasone   (FLONASE ) 50 MCG/ACT nasal spray, Place 1 spray into both nostrils daily. (Patient taking differently: Place 1 spray into both nostrils daily as needed for allergies or rhinitis.), Disp: 16 g, Rfl: 1   furosemide  (LASIX ) 20 MG tablet, Take 1 tablet (20 mg total) by mouth daily., Disp: 30 tablet, Rfl: 2   insulin  aspart (NOVOLOG ) 100 UNIT/ML FlexPen, Inject 0-6 Units into the skin 3 (three) times daily with meals. Check Blood Glucose (BG) and inject per scale: BG <150= 0 unit; BG 150-200= 1 unit; BG 201-250= 2 unit; BG 251-300= 3 unit; BG 301-350= 4 unit; BG 351-400= 5 unit; BG >400= 6 unit and Call Primary Care., Disp: 15 mL, Rfl: 1   insulin  glargine-yfgn (SEMGLEE ) 100 UNIT/ML Pen, Inject 15 Units into the skin daily., Disp: 15 mL, Rfl: 1   Insulin  Pen Needle (PEN NEEDLES) 31G X 5 MM MISC, Use to inject insulin  3 times daily, Disp: 100 each, Rfl: 0   Lancet Device MISC, 1 each by Does not apply route 3 (three) times daily. May dispense any manufacturer covered by patient's insurance., Disp: 1 each, Rfl: 0   Lancets MISC, Use to check blood sugar three times daily, Disp: 100 each, Rfl: 0   losartan  (COZAAR ) 50 MG tablet, Take 1 tablet (50 mg total) by mouth at bedtime., Disp: 30 tablet, Rfl: 2   omeprazole  (PRILOSEC) 20 MG capsule, Take 2 capsules (40 mg total) by mouth daily., Disp: 60 capsule, Rfl: 2   rosuvastatin  (CRESTOR ) 20 MG tablet, Take 1 tablet (20 mg total) by mouth daily., Disp: 30 tablet, Rfl: 2   spironolactone  (  ALDACTONE ) 25 MG tablet, Take 0.5 tablets (12.5 mg total) by mouth every morning., Disp: 45 tablet, Rfl: 1   Allergies  Allergen Reactions   Metformin Diarrhea and Other (See Comments)    Only with a high dose (diarrhea)    Past Medical History:  Diagnosis Date   CHF (congestive heart failure) (HCC)    Diabetes mellitus without complication (HCC)    DM type 2 (diabetes mellitus, type 2) (HCC)    Hypertension      Past Surgical History:  Procedure Laterality Date    RIGHT/LEFT HEART CATH AND CORONARY ANGIOGRAPHY N/A 07/23/2023   Procedure: RIGHT/LEFT HEART CATH AND CORONARY ANGIOGRAPHY;  Surgeon: Wonda Sharper, MD;  Location: [facility] INVASIVE CV LAB;  Service: Cardiovascular;  Laterality: N/A;   TRANSESOPHAGEAL ECHOCARDIOGRAM (CATH LAB) N/A 07/26/2023   Procedure: TRANSESOPHAGEAL ECHOCARDIOGRAM;  Surgeon: Francyne Headland, MD;  Location: [facility] INVASIVE CV LAB;  Service: Cardiovascular;  Laterality: N/A;    Family History  Problem Relation Age of Onset   Hypertension Mother    Diabetes Mother    Diabetes Sister    Diabetes Brother    Diabetes Other    Hypertension Other     Social History   Tobacco Use   Smoking status: Never   Smokeless tobacco: Never  Vaping Use   Vaping status: Never Used  Substance Use Topics   Alcohol use: No   Drug use: No    ROS Refer to HPI for ROS details.  Objective:    Vitals: BP 128/86 (BP Location: Left Arm)   Pulse (!) 102   Temp 98.3 F (36.8 C)   Resp 17   LMP 10/03/2012   SpO2 95%   Physical Exam Vitals and nursing note reviewed.  Constitutional:      General: She is not in acute distress.    Appearance: Normal appearance. She is well-developed. She is not ill-appearing or toxic-appearing.  HENT:     Head: Normocephalic and atraumatic.     Mouth/Throat:     Mouth: Mucous membranes are moist.  Cardiovascular:     Rate and Rhythm: Regular rhythm. Tachycardia present.     Heart sounds: Normal heart sounds. No murmur heard. Pulmonary:     Effort: Pulmonary effort is normal. No respiratory distress.     Breath sounds: Normal breath sounds. No stridor. No wheezing, rhonchi or rales.  Chest:     Chest wall: No tenderness.  Musculoskeletal:     Right lower leg: Swelling present. No tenderness or bony tenderness. 2+ Edema present.     Left lower leg: Swelling present. No tenderness or bony tenderness. 2+ Edema present.  Skin:    General: Skin is warm and dry.  Neurological:     General: No focal  deficit present.     Mental Status: She is alert and oriented to person, place, and time.  Psychiatric:        Mood and Affect: Mood normal.        Behavior: Behavior normal.     Procedures  No results found for this or any previous visit (from the past 24 hours).  Assessment and Plan :     Discharge Instructions       1. Acute on chronic congestive heart failure, unspecified heart failure type (HCC) (Primary) - Due to cardiac history and significant concern for CHF exacerbation causing symptoms of shortness of breath recommend follow-up in emergency department immediately for further evaluation and management. - Management of chronic congestive heart failure is  beyond the means of urgent care for treatment due to need for stat laboratory testing, advanced imaging and IV medications that are unavailable in urgent care setting. - As discussed please go to [facility] emergency department after leaving urgent care for evaluation and management of suspected CHF exacerbation.      Pradyun Ishman B Mathew Postiglione   Jeraldin Fesler, [place], [place] 10/18/23 1527

## 2023-10-19 ENCOUNTER — Emergency Department (HOSPITAL_COMMUNITY)
Admission: EM | Admit: 2023-10-19 | Discharge: 2023-10-20 | Discharge status: Against Medical Advice | Attending: Emergency Medicine | Admitting: Emergency Medicine

## 2023-10-19 ENCOUNTER — Emergency Department (HOSPITAL_COMMUNITY)

## 2023-10-19 ENCOUNTER — Encounter (HOSPITAL_COMMUNITY): Payer: Self-pay

## 2023-10-19 ENCOUNTER — Other Ambulatory Visit: Payer: Self-pay

## 2023-10-19 DIAGNOSIS — Z5321 Procedure and treatment not carried out due to patient leaving prior to being seen by health care provider: Secondary | ICD-10-CM | POA: Diagnosis not present

## 2023-10-19 DIAGNOSIS — I509 Heart failure, unspecified: Secondary | ICD-10-CM | POA: Diagnosis not present

## 2023-10-19 DIAGNOSIS — R0602 Shortness of breath: Secondary | ICD-10-CM | POA: Diagnosis present

## 2023-10-19 DIAGNOSIS — R059 Cough, unspecified: Secondary | ICD-10-CM | POA: Insufficient documentation

## 2023-10-19 LAB — CBC
HCT: 39.9 % (ref 36.0–46.0)
Hemoglobin: 12.3 g/dL (ref 12.0–15.0)
MCH: 26.9 pg (ref 26.0–34.0)
MCHC: 30.8 g/dL (ref 30.0–36.0)
MCV: 87.3 fL (ref 80.0–100.0)
Platelets: 445 K/uL — ABNORMAL HIGH (ref 150–400)
RBC: 4.57 MIL/uL (ref 3.87–5.11)
RDW: 14.9 % (ref 11.5–15.5)
WBC: 8 K/uL (ref 4.0–10.5)
nRBC: 0 % (ref 0.0–0.2)

## 2023-10-19 LAB — BASIC METABOLIC PANEL WITH GFR
Anion gap: 7 (ref 5–15)
BUN: 17 mg/dL (ref 6–20)
CO2: 28 mmol/L (ref 22–32)
Calcium: 8.9 mg/dL (ref 8.9–10.3)
Chloride: 105 mmol/L (ref 98–111)
Creatinine, Ser: 0.87 mg/dL (ref 0.44–1.00)
GFR, Estimated: >60 mL/min (ref >60)
Glucose, Bld: 68 mg/dL — ABNORMAL LOW (ref 70–99)
Potassium: 3.5 mmol/L (ref 3.5–5.1)
Sodium: 140 mmol/L (ref 135–145)

## 2023-10-19 NOTE — ED Notes (Addendum)
 SABRA

## 2023-10-19 NOTE — Telephone Encounter (Signed)
 Pt went to urgent care yesterday.

## 2023-10-19 NOTE — ED Triage Notes (Addendum)
 Patient has been short of breath since last Friday. Was told she has heart failure and takes lasix . Feels short of breath walking and at rest. No chest pain. Coughs up clear mucus.

## 2023-10-20 NOTE — ED Notes (Signed)
Pt left per registration 

## 2023-10-22 ENCOUNTER — Encounter (HOSPITAL_COMMUNITY): Payer: Self-pay

## 2023-10-22 ENCOUNTER — Emergency Department (HOSPITAL_COMMUNITY)
Admission: EM | Admit: 2023-10-22 | Discharge: 2023-10-22 | Disposition: A | Discharge status: Home / Self Care | Attending: Emergency Medicine | Admitting: Emergency Medicine

## 2023-10-22 ENCOUNTER — Other Ambulatory Visit: Payer: Self-pay

## 2023-10-22 ENCOUNTER — Emergency Department (HOSPITAL_COMMUNITY)

## 2023-10-22 DIAGNOSIS — Z794 Long term (current) use of insulin: Secondary | ICD-10-CM | POA: Insufficient documentation

## 2023-10-22 DIAGNOSIS — R7989 Other specified abnormal findings of blood chemistry: Secondary | ICD-10-CM | POA: Diagnosis not present

## 2023-10-22 DIAGNOSIS — Z79899 Other long term (current) drug therapy: Secondary | ICD-10-CM | POA: Insufficient documentation

## 2023-10-22 DIAGNOSIS — R0602 Shortness of breath: Secondary | ICD-10-CM | POA: Diagnosis present

## 2023-10-22 DIAGNOSIS — I110 Hypertensive heart disease with heart failure: Secondary | ICD-10-CM | POA: Insufficient documentation

## 2023-10-22 DIAGNOSIS — I509 Heart failure, unspecified: Secondary | ICD-10-CM | POA: Diagnosis not present

## 2023-10-22 DIAGNOSIS — R600 Localized edema: Secondary | ICD-10-CM | POA: Diagnosis not present

## 2023-10-22 DIAGNOSIS — E119 Type 2 diabetes mellitus without complications: Secondary | ICD-10-CM | POA: Insufficient documentation

## 2023-10-22 LAB — CBC
HCT: 42.3 % (ref 36.0–46.0)
Hemoglobin: 12.4 g/dL (ref 12.0–15.0)
MCH: 25.8 pg — ABNORMAL LOW (ref 26.0–34.0)
MCHC: 29.3 g/dL — ABNORMAL LOW (ref 30.0–36.0)
MCV: 88.1 fL (ref 80.0–100.0)
Platelets: 447 K/uL — ABNORMAL HIGH (ref 150–400)
RBC: 4.8 MIL/uL (ref 3.87–5.11)
RDW: 15 % (ref 11.5–15.5)
WBC: 7.9 K/uL (ref 4.0–10.5)
nRBC: 0 % (ref 0.0–0.2)

## 2023-10-22 LAB — BASIC METABOLIC PANEL WITH GFR
Anion gap: 8 (ref 5–15)
BUN: 16 mg/dL (ref 6–20)
CO2: 29 mmol/L (ref 22–32)
Calcium: 9.5 mg/dL (ref 8.9–10.3)
Chloride: 109 mmol/L (ref 98–111)
Creatinine, Ser: 0.85 mg/dL (ref 0.44–1.00)
GFR, Estimated: >60 mL/min (ref >60)
Glucose, Bld: 175 mg/dL — ABNORMAL HIGH (ref 70–99)
Potassium: 4 mmol/L (ref 3.5–5.1)
Sodium: 146 mmol/L — ABNORMAL HIGH (ref 135–145)

## 2023-10-22 LAB — BRAIN NATRIURETIC PEPTIDE: B Natriuretic Peptide: 1356.8 pg/mL — ABNORMAL HIGH (ref 0.0–100.0)

## 2023-10-22 MED ORDER — FUROSEMIDE 20 MG PO TABS: ORAL_TABLET | ORAL | 0 refills | Status: DC

## 2023-10-22 MED ORDER — FUROSEMIDE 20 MG PO TABS
ORAL_TABLET | ORAL | 0 refills | Status: DC
  Filled 2023-10-22: qty 30, 25d supply, fill #0

## 2023-10-22 MED ORDER — FUROSEMIDE 10 MG/ML IJ SOLN
40.0000 mg | Freq: Once | INTRAMUSCULAR | Status: AC
  Administered 2023-10-22: 40 mg via INTRAVENOUS
  Filled 2023-10-22: qty 4

## 2023-10-22 NOTE — Discharge Instructions (Signed)
 Increase your Lasix  to 20 mg twice a day for the next 5 days.  Follow-up with your cardiologist to be rechecked to make sure your symptoms are improving.  Return to the ED for worsening symptoms.

## 2023-10-22 NOTE — ED Provider Notes (Signed)
 [place] EMERGENCY DEPARTMENT AT [facility] Provider Note   CSN: [id number] Arrival date & time: 10/22/23  1404     Patient presents with: Shortness of Breath   Natasha Hudson is a 58 y.o. female.    Shortness of Breath    Patient has a history of multifocal pneumonia diabetes hypertension, CHF cardiomyopathy medic heart disease.  Patient presents ED with complaints of shortness of breath ongoing for the last week.  Patient states she notices it when she is walking around.  She has had no significant coughing.  No fever.  She had had some mild leg swelling but that seems to be a little bit better.  Patient states she went to an urgent care on the 11th who suggested she go to the ED.  Patient went to the ED but the wait was too long she left.  Prior to Admission medications   Medication Sig Start Date End Date Taking? Authorizing Provider  ACCU-CHEK GUIDE TEST test strip USE TO CHECK BLOOD SUGAR THREE TIMES DAILY 09/30/23   Yacopino, Jessica L, NP  albuterol  (VENTOLIN  HFA) 108 (90 Base) MCG/ACT inhaler Inhale 1-2 puffs into the lungs every 4 (four) hours as needed for wheezing or shortness of breath. Patient taking differently: Inhale 1-2 puffs into the lungs every 3 (three) hours as needed for wheezing or shortness of breath. 09/03/23   Wheeler Harlene CROME, NP  BAYER LOW DOSE 81 MG tablet Take 81 mg by mouth in the morning. Swallow whole.    [provider]  Blood Glucose Monitoring Suppl (BLOOD GLUCOSE MONITOR SYSTEM) w/Device KIT Use to check blood sugar as directed 07/26/23   Rojelio Nest, DO  empagliflozin  (JARDIANCE ) 10 MG TABS tablet Take 1 tablet (10 mg total) by mouth daily. 09/08/23   Sebastian Toribio GAILS, MD  fluticasone  (FLONASE ) 50 MCG/ACT nasal spray Place 1 spray into both nostrils daily. Patient taking differently: Place 1 spray into both nostrils daily as needed for allergies or rhinitis. 08/20/23   Wheeler Harlene CROME, NP  furosemide  (LASIX ) 20 MG  tablet Take 1 tablet (20 mg total) by mouth daily. 09/09/23   Sebastian Toribio GAILS, MD  insulin  aspart (NOVOLOG ) 100 UNIT/ML FlexPen Inject 0-6 Units into the skin 3 (three) times daily with meals. Check Blood Glucose (BG) and inject per scale: BG <150= 0 unit; BG 150-200= 1 unit; BG 201-250= 2 unit; BG 251-300= 3 unit; BG 301-350= 4 unit; BG 351-400= 5 unit; BG >400= 6 unit and Call Primary Care. 09/03/23   Wheeler Harlene CROME, NP  insulin  glargine-yfgn (SEMGLEE ) 100 UNIT/ML Pen Inject 15 Units into the skin daily. 09/03/23   Wheeler Harlene CROME, NP  Insulin  Pen Needle (PEN NEEDLES) 31G X 5 MM MISC Use to inject insulin  3 times daily 07/26/23   Rojelio Nest, DO  Lancet Device MISC 1 each by Does not apply route 3 (three) times daily. May dispense any manufacturer covered by patient's insurance. 07/26/23   Rojelio Nest, DO  Lancets MISC Use to check blood sugar three times daily 07/26/23   Rojelio Nest, DO  losartan  (COZAAR ) 50 MG tablet Take 1 tablet (50 mg total) by mouth at bedtime. 09/08/23   Sebastian Toribio GAILS, MD  omeprazole  (PRILOSEC) 20 MG capsule Take 2 capsules (40 mg total) by mouth daily. 09/08/23   Sebastian Toribio GAILS, MD  rosuvastatin  (CRESTOR ) 20 MG tablet Take 1 tablet (20 mg total) by mouth daily. 09/08/23   Sebastian Toribio GAILS, MD  spironolactone  (ALDACTONE ) 25 MG  tablet Take 0.5 tablets (12.5 mg total) by mouth every morning. 09/08/23   Sebastian Toribio GAILS, MD    Allergies: Metformin    Review of Systems  Respiratory:  Positive for shortness of breath.     Updated Vital Signs BP (!) 146/102 (BP Location: Right Arm)   Pulse (!) 108   Temp 97.7 F (36.5 C) (Oral)   Resp 18   Ht 1.651 m (5' 5)   Wt 68 kg   LMP 10/03/2012   SpO2 94%   BMI 24.96 kg/m   Physical Exam Vitals and nursing note reviewed.  Constitutional:      General: She is not in acute distress.    Appearance: She is well-developed.  HENT:     Head: Normocephalic and atraumatic.     Right Ear: External ear normal.      Left Ear: External ear normal.  Eyes:     General: No scleral icterus.       Right eye: No discharge.        Left eye: No discharge.     Conjunctiva/sclera: Conjunctivae normal.  Neck:     Trachea: No tracheal deviation.  Cardiovascular:     Rate and Rhythm: Normal rate and regular rhythm.  Pulmonary:     Effort: Pulmonary effort is normal. No respiratory distress.     Breath sounds: Normal breath sounds. No stridor. No wheezing or rales.  Abdominal:     General: Bowel sounds are normal. There is no distension.     Palpations: Abdomen is soft.     Tenderness: There is no abdominal tenderness. There is no guarding or rebound.  Musculoskeletal:        General: No tenderness or deformity.     Cervical back: Neck supple.     Right lower leg: Edema present.     Left lower leg: Edema present.     Comments: Trace edema bilateral lower extremities  Skin:    General: Skin is warm and dry.     Findings: No rash.  Neurological:     General: No focal deficit present.     Mental Status: She is alert.     Cranial Nerves: No cranial nerve deficit, dysarthria or facial asymmetry.     Sensory: No sensory deficit.     Motor: No abnormal muscle tone or seizure activity.     Coordination: Coordination normal.  Psychiatric:        Mood and Affect: Mood normal.     (all labs ordered are listed, but only abnormal results are displayed) Labs Reviewed  BASIC METABOLIC PANEL WITH GFR - Abnormal; Notable for the following components:      Result Value   Sodium 146 (*)    Glucose, Bld 175 (*)    All other components within normal limits  CBC - Abnormal; Notable for the following components:   MCH 25.8 (*)    MCHC 29.3 (*)    Platelets 447 (*)    All other components within normal limits  BRAIN NATRIURETIC PEPTIDE - Abnormal; Notable for the following components:   B Natriuretic Peptide 1,356.8 (*)    All other components within normal limits    EKG: None  Radiology: DG Chest 2  View Result Date: 10/22/2023 CLINICAL DATA:  SOB EXAM: CHEST - 2 VIEW COMPARISON:  Chest x-ray 10/19/2023, CT chest 07/18/2023 FINDINGS: The heart and mediastinal contours are within normal limits. No focal consolidation. No pulmonary edema. No pleural effusion. No pneumothorax. No acute  osseous abnormality. IMPRESSION: No active cardiopulmonary disease. Electronically Signed   By: Morgane  Naveau M.D.   On: 10/22/2023 15:20     Procedures   Medications Ordered in the ED  furosemide  (LASIX ) injection 40 mg (has no administration in time range)    Clinical Course as of 10/22/23 1654  Fri Oct 22, 2023  1638 CBC normal.  Metabolic panel with mild increase in sodium and glucose doubt clinically significant [JK]  1639 X-ray without acute abnormalities [JK]  1651 BNP (Order if Patient has history of Heart Failure)(!) BNP is elevated compared to previous [JK]    Clinical Course User Index [JK] Randol Simmonds, MD                                 Medical Decision Making Problems Addressed: Congestive heart failure, unspecified HF chronicity, unspecified heart failure type [facility]): acute illness or injury that poses a threat to life or bodily functions  Amount and/or Complexity of Data Reviewed Labs: ordered. Decision-making details documented in ED Course. Radiology: ordered and independent interpretation performed.  Risk Prescription drug management. Decision regarding hospitalization.   Patient presents to the ED for evaluation of increasing shortness of breath.  Patient has noted some increasing leg swelling.  She has not had any fevers chills coughing.  Patient does have history of congestive heart failure.  She does have some edema on exam.  Patient does not have any oxygen requirement.  There is no respiratory difficulty.  Labs are suggestive of CHF exacerbation with increased BNP.  There is no significant pulmonary edema however noted on x-ray.  Will give her a dose of Lasix  here in  the ED.  I think she is appropriate for outpatient management and does not require hospitalization at this time.  Will increase her Lasix  to 20 mg twice daily and have her follow-up with her cardiologist closely.     Final diagnoses:  Congestive heart failure, unspecified HF chronicity, unspecified heart failure type [facility])    ED Discharge Orders          Ordered    Ambulatory referral to Cardiology       Comments: If you have not heard from the Cardiology office within the next 72 hours please call [phone].   10/22/23 1653               Randol Simmonds, MD 10/22/23 1654

## 2023-10-22 NOTE — ED Triage Notes (Signed)
 Pt presents to ED from home C/O SOB X 1 week, gradually worsening. Hx CHF.

## 2023-10-22 NOTE — ED Notes (Signed)
 Pt ambulatory to Xray, who will take her to room 3 when complete.

## 2023-10-23 ENCOUNTER — Other Ambulatory Visit (HOSPITAL_COMMUNITY): Payer: Self-pay

## 2023-10-25 ENCOUNTER — Other Ambulatory Visit (HOSPITAL_COMMUNITY): Payer: Self-pay

## 2023-10-30 ENCOUNTER — Other Ambulatory Visit: Payer: Self-pay | Admitting: Student

## 2023-10-30 DIAGNOSIS — E1165 Type 2 diabetes mellitus with hyperglycemia: Secondary | ICD-10-CM

## 2023-11-05 ENCOUNTER — Other Ambulatory Visit (HOSPITAL_COMMUNITY): Payer: Self-pay

## 2023-11-05 ENCOUNTER — Other Ambulatory Visit: Payer: Self-pay

## 2023-11-06 ENCOUNTER — Other Ambulatory Visit (HOSPITAL_COMMUNITY): Payer: Self-pay

## 2023-11-10 ENCOUNTER — Encounter: Payer: Self-pay | Admitting: Cardiology

## 2023-11-10 ENCOUNTER — Ambulatory Visit: Attending: Cardiology | Admitting: Cardiology

## 2023-11-10 VITALS — BP 140/88 | HR 101 | Ht 65.0 in | Wt 151.0 lb

## 2023-11-10 DIAGNOSIS — I10 Essential (primary) hypertension: Secondary | ICD-10-CM

## 2023-11-10 DIAGNOSIS — I420 Dilated cardiomyopathy: Secondary | ICD-10-CM | POA: Diagnosis not present

## 2023-11-10 DIAGNOSIS — I447 Left bundle-branch block, unspecified: Secondary | ICD-10-CM

## 2023-11-10 DIAGNOSIS — E119 Type 2 diabetes mellitus without complications: Secondary | ICD-10-CM

## 2023-11-10 DIAGNOSIS — I340 Nonrheumatic mitral (valve) insufficiency: Secondary | ICD-10-CM | POA: Diagnosis not present

## 2023-11-10 DIAGNOSIS — R0609 Other forms of dyspnea: Secondary | ICD-10-CM

## 2023-11-10 MED ORDER — SACUBITRIL-VALSARTAN 24-26 MG PO TABS: 1.0000 | ORAL_TABLET | Freq: Two times a day (BID) | ORAL | 3 refills | Status: AC

## 2023-11-10 MED ORDER — SACUBITRIL-VALSARTAN 24-26 MG PO TABS: 1.0000 | ORAL_TABLET | Freq: Two times a day (BID) | ORAL | Status: DC

## 2023-11-10 NOTE — Addendum Note (Signed)
 Addended by: ARLOA PLANAS D on: 11/10/2023 09:46 AM   Modules accepted: Orders

## 2023-11-10 NOTE — Progress Notes (Signed)
 Cardiology Office Note:    Date:  11/10/2023   ID:  [name], DOB 1965/08/16, MRN [id number]  PCP:  Wheeler Harlene CROME, NP  Cardiologist:  Lamar Fitch, MD    Referring MD: Wheeler Harlene CROME, NP   No chief complaint on file.   History of Present Illness:    Natasha Hudson is a 58 y.o. female past medical history significant for nonischemic cardiomyopathy recently recognized in May, she did have cardiac catheterization which show no significant coronary artery disease, she also got MRI done looking for LV thrombus likely she did not have it.  Additional problem include diabetes, essential hypertension.  She was appropriately managed and discharged from the hospital.  Since that time she did suffer from fluids.  Comes to our office today for follow-up overall she says she is doing fine she is described to have some cough especially at night.  She has to wake up maybe once to go to the restroom.  Denies having much shortness of breath chest pain tightness squeezing pressure in chest.  Past Medical History:  Diagnosis Date   CHF (congestive heart failure) (HCC)    Diabetes mellitus without complication (HCC)    DM type 2 (diabetes mellitus, type 2) (HCC)    Hypertension     Past Surgical History:  Procedure Laterality Date   RIGHT/LEFT HEART CATH AND CORONARY ANGIOGRAPHY N/A 07/23/2023   Procedure: RIGHT/LEFT HEART CATH AND CORONARY ANGIOGRAPHY;  Surgeon: Wonda Sharper, MD;  Location: [facility] INVASIVE CV LAB;  Service: Cardiovascular;  Laterality: N/A;   TRANSESOPHAGEAL ECHOCARDIOGRAM (CATH LAB) N/A 07/26/2023   Procedure: TRANSESOPHAGEAL ECHOCARDIOGRAM;  Surgeon: Francyne Headland, MD;  Location: [facility] INVASIVE CV LAB;  Service: Cardiovascular;  Laterality: N/A;    Current Medications: Current Meds  Medication Sig   ACCU-CHEK GUIDE TEST test strip USE TO CHECK BLOOD SUGAR THREE TIMES DAILY   albuterol  (VENTOLIN  HFA) 108 (90 Base) MCG/ACT inhaler Inhale 1-2 puffs into the lungs  every 4 (four) hours as needed for wheezing or shortness of breath. (Patient taking differently: Inhale 1-2 puffs into the lungs every 3 (three) hours as needed for wheezing or shortness of breath.)   BAYER LOW DOSE 81 MG tablet Take 81 mg by mouth in the morning. Swallow whole.   Blood Glucose Monitoring Suppl (BLOOD GLUCOSE MONITOR SYSTEM) w/Device KIT Use to check blood sugar as directed   empagliflozin  (JARDIANCE ) 10 MG TABS tablet Take 1 tablet (10 mg total) by mouth daily.   furosemide  (LASIX ) 20 MG tablet Take 2 tablets (40 mg total) by mouth daily for 5 days, THEN 1 tablet (20 mg total) daily for 20 days.   insulin  aspart (NOVOLOG ) 100 UNIT/ML FlexPen Inject 0-6 Units into the skin 3 (three) times daily with meals. Check Blood Glucose (BG) and inject per scale: BG <150= 0 unit; BG 150-200= 1 unit; BG 201-250= 2 unit; BG 251-300= 3 unit; BG 301-350= 4 unit; BG 351-400= 5 unit; BG >400= 6 unit and Call Primary Care.   Insulin  Pen Needle (PEN NEEDLES) 31G X 5 MM MISC Use to inject insulin  3 times daily   Lancet Device MISC 1 each by Does not apply route 3 (three) times daily. May dispense any manufacturer covered by patient's insurance.   Lancets MISC Use to check blood sugar three times daily   LANTUS  SOLOSTAR 100 UNIT/ML Solostar Pen Inject 15 Units into the skin daily.   losartan  (COZAAR ) 50 MG tablet Take 1 tablet (50 mg total) by mouth at bedtime.  omeprazole  (PRILOSEC) 20 MG capsule Take 2 capsules (40 mg total) by mouth daily.   rosuvastatin  (CRESTOR ) 20 MG tablet Take 1 tablet (20 mg total) by mouth daily.   spironolactone  (ALDACTONE ) 25 MG tablet Take 0.5 tablets (12.5 mg total) by mouth every morning.     Allergies:   Metformin   Social History   Socioeconomic History   Marital status: Single    Spouse name: Not on file   Number of children: Not on file   Years of education: Not on file   Highest education level: Not on file  Occupational History   Not on file  Tobacco Use    Smoking status: Never   Smokeless tobacco: Never  Vaping Use   Vaping status: Never Used  Substance and Sexual Activity   Alcohol use: No   Drug use: No   Sexual activity: Not on file  Other Topics Concern   Not on file  Social History Narrative   Not on file   Social Drivers of Health   Financial Resource Strain: Not on file  Food Insecurity: No Food Insecurity (09/14/2023)   Hunger Vital Sign    Worried About Running Out of Food in the Last Year: Never true    Ran Out of Food in the Last Year: Never true  Transportation Needs: No Transportation Needs (09/14/2023)   PRAPARE - Administrator, Civil Service (Medical): No    Lack of Transportation (Non-Medical): No  Physical Activity: Not on file  Stress: Not on file  Social Connections: Unknown (07/19/2023)   Social Connection and Isolation Panel    Frequency of Communication with Friends and Family: Three times a week    Frequency of Social Gatherings with Friends and Family: Three times a week    Attends Religious Services: 1 to 4 times per year    Active Member of Clubs or Organizations: No    Attends Banker Meetings: Never    Marital Status: Patient declined     Family History: The patient's family history includes Diabetes in her brother, mother, sister, and another family member; Hypertension in her mother and another family member. ROS:   Please see the history of present illness.    All 14 point review of systems negative except as described per history of present illness  EKGs/Labs/Other Studies Reviewed:         Recent Labs: 07/18/2023: Pro Brain Natriuretic Peptide 2,515.0 09/08/2023: Magnesium 1.8 09/24/2023: ALT 47 10/22/2023: B Natriuretic Peptide 1,356.8; BUN 16; Creatinine, Ser 0.85; Hemoglobin 12.4; Platelets 447; Potassium 4.0; Sodium 146  Recent Lipid Panel    Component Value Date/Time   CHOL 152 07/20/2023 0403   TRIG 73 07/20/2023 0403   HDL 41 07/20/2023 0403   CHOLHDL  3.7 07/20/2023 0403   VLDL 15 07/20/2023 0403   LDLCALC 96 07/20/2023 0403    Physical Exam:    VS:  BP (!) 140/88   Pulse (!) 101   Ht 5' 5 (1.651 m)   Wt 151 lb (68.5 kg)   LMP 10/03/2012   SpO2 97%   BMI 25.13 kg/m     Wt Readings from Last 3 Encounters:  11/10/23 151 lb (68.5 kg)  10/22/23 150 lb (68 kg)  10/19/23 150 lb (68 kg)     GEN:  Well nourished, well developed in no acute distress HEENT: Normal NECK: No JVD; No carotid bruits LYMPHATICS: No lymphadenopathy CARDIAC: RRR, no murmurs, no rubs, no gallops RESPIRATORY:  Clear  to auscultation without rales, wheezing or rhonchi  ABDOMEN: Soft, non-tender, non-distended MUSCULOSKELETAL: 1+ edema; No deformity  SKIN: Warm and dry LOWER EXTREMITIES: no swelling NEUROLOGIC:  Alert and oriented x 3 PSYCHIATRIC:  Normal affect   ASSESSMENT:    1. Dilated cardiomyopathy (HCC)   2. Essential hypertension   3. LBBB (left bundle branch block)   4. Nonrheumatic mitral (valve) insufficiency   5. Non-insulin  dependent type 2 diabetes mellitus (HCC)    PLAN:    In order of problems listed above:  Dilated cardiomyopathy we will try to put on guideline directed medical therapy.  Will stop her losartan  and put her on Entresto  24-26 twice daily, Chem-7 and proBNP will be done next week.  Next step will be probably addition of beta-blocker.  I will see her back in my office in about 3 weeks to quickly titrate her medication.  Will continue with Jardiance , Aldactone , Crestor , Lasix  at the present dose.  But anticipate need to increase dose of diuretic to help with diuresis even though she looks fairly compensated on physical exam just mild swelling of lower extremities. Left bundle branch block.  Will repeat echocardiogram probably in about a month or 2 if it still diminished which anticipated will be the case we will consider BiV pacing. Nonrheumatic mitral valve regurgitation hopefully with improvement left ventricle ejection  fraction will improve regurgitation as well. Diabetes followed by internal medicine team, last hemoglobin A1c 9.1 which is still not well-controlled she understand she is trying to work on it.   Medication Adjustments/Labs and Tests Ordered: Current medicines are reviewed at length with the patient today.  Concerns regarding medicines are outlined above.  No orders of the defined types were placed in this encounter.  Medication changes: No orders of the defined types were placed in this encounter.   Signed, Lamar DOROTHA Fitch, MD, [facility] 11/10/2023 9:23 AM    [facility]

## 2023-11-10 NOTE — Patient Instructions (Addendum)
 Medication Instructions:   STOP: Cozaar /Losartan   START: Entresto  24-26 1 tablet twice daily   Lab Work: 3rd Floor   [address]  Your physician recommends that you return for lab work in:   1 week You need to have labs done when you are fasting.  You can come Monday through Friday 8:00 am to 11:30AM and 1:00 to 4:00. You do not need to make an appointment as the order has already been placed.    Testing/Procedures: None Ordered   Follow-Up: At [facility], you and your health needs are our priority.  As part of our continuing mission to provide you with exceptional heart care, we have created designated Provider Care Teams.  These Care Teams include your primary Cardiologist (physician) and Advanced Practice Providers (APPs -  Physician Assistants and Nurse Practitioners) who all work together to provide you with the care you need, when you need it.  We recommend signing up for the patient portal called MyChart.  Sign up information is provided on this After Visit Summary.  MyChart is used to connect with patients for Virtual Visits (Telemedicine).  Patients are able to view lab/test results, encounter notes, upcoming appointments, etc.  Non-urgent messages can be sent to your provider as well.   To learn more about what you can do with MyChart, go to [email or web address].    Your next appointment:   3 -4 week(s)  The format for your next appointment:   In Person  Provider:   Lamar Fitch, MD    Other Instructions NA

## 2023-11-14 ENCOUNTER — Other Ambulatory Visit (HOSPITAL_COMMUNITY): Payer: Self-pay

## 2023-11-15 ENCOUNTER — Other Ambulatory Visit: Payer: Self-pay | Admitting: Student

## 2023-11-15 MED ORDER — EMPAGLIFLOZIN 10 MG PO TABS: 10.0000 mg | ORAL_TABLET | Freq: Every day | ORAL | 2 refills | Status: DC

## 2023-11-15 MED ORDER — FUROSEMIDE 20 MG PO TABS: 20.0000 mg | ORAL_TABLET | Freq: Every day | ORAL | 0 refills | Status: DC

## 2023-11-15 NOTE — Telephone Encounter (Unsigned)
 Copied from CRM [id number]. Topic: Clinical - Medication Refill >> Nov 15, 2023  3:05 PM Henretta I wrote: Medication:  furosemide  (LASIX ) 20 MG tablet empagliflozin  (JARDIANCE ) 10 MG TABS tablet  Has the patient contacted their pharmacy? Yes, she is out of refills  (Agent: If no, request that the patient contact the pharmacy for the refill. If patient does not wish to contact the pharmacy document the reason why and proceed with request.) (Agent: If yes, when and what did the pharmacy advise?)  This is the patient's preferred pharmacy:  [id number] - [place], [place] - [address] [place] [address] Phone: [phone] Fax: [phone]   Is this the correct pharmacy for this prescription? Yes If no, delete pharmacy and type the correct one.   Has the prescription been filled recently? No  Is the patient out of the medication? No, 2 pills left of lasix    Has the patient been seen for an appointment in the last year OR does the patient have an upcoming appointment? Yes  Can we respond through MyChart? Yes  Agent: Please be advised that Rx refills may take up to 3 business days. We ask that you follow-up with your pharmacy.

## 2023-11-20 LAB — BASIC METABOLIC PANEL WITH GFR
BUN/Creatinine Ratio: 20 (ref 12–28)
BUN: 18 mg/dL (ref 8–27)
CO2: 24 mmol/L (ref 20–29)
Calcium: 9.5 mg/dL (ref 8.7–10.3)
Chloride: 103 mmol/L (ref 96–106)
Creatinine, Ser: 0.88 mg/dL (ref 0.57–1.00)
Glucose: 184 mg/dL — ABNORMAL HIGH (ref 70–99)
Potassium: 4.6 mmol/L (ref 3.5–5.2)
Sodium: 145 mmol/L — ABNORMAL HIGH (ref 134–144)
eGFR: 75 mL/min/1.73 (ref >59)

## 2023-11-20 LAB — PRO B NATRIURETIC PEPTIDE: NT-Pro BNP: 2222 pg/mL — ABNORMAL HIGH (ref 0–287)

## 2023-11-22 ENCOUNTER — Ambulatory Visit: Payer: Self-pay | Admitting: Cardiology

## 2023-11-25 ENCOUNTER — Other Ambulatory Visit (HOSPITAL_COMMUNITY): Payer: Self-pay

## 2023-11-25 ENCOUNTER — Ambulatory Visit: Admitting: Student

## 2023-11-25 ENCOUNTER — Encounter: Payer: Self-pay | Admitting: Student

## 2023-11-25 VITALS — BP 153/85 | HR 101 | Resp 16 | Ht <= 58 in | Wt 156.0 lb

## 2023-11-25 DIAGNOSIS — Z23 Encounter for immunization: Secondary | ICD-10-CM | POA: Diagnosis not present

## 2023-11-25 DIAGNOSIS — I509 Heart failure, unspecified: Secondary | ICD-10-CM | POA: Diagnosis not present

## 2023-11-25 DIAGNOSIS — E1165 Type 2 diabetes mellitus with hyperglycemia: Secondary | ICD-10-CM | POA: Diagnosis not present

## 2023-11-25 DIAGNOSIS — J3089 Other allergic rhinitis: Secondary | ICD-10-CM

## 2023-11-25 DIAGNOSIS — J302 Other seasonal allergic rhinitis: Secondary | ICD-10-CM

## 2023-11-25 DIAGNOSIS — I10 Essential (primary) hypertension: Secondary | ICD-10-CM

## 2023-11-25 MED ORDER — FLUTICASONE PROPIONATE 50 MCG/ACT NA SUSP: 2.0000 | Freq: Every day | NASAL | 6 refills | Status: AC

## 2023-11-25 MED ORDER — CETIRIZINE HCL 10 MG PO TABS: 10.0000 mg | ORAL_TABLET | Freq: Every day | ORAL | 3 refills | Status: DC

## 2023-11-25 MED ORDER — FUROSEMIDE 20 MG PO TABS: 20.0000 mg | ORAL_TABLET | Freq: Every day | ORAL | 1 refills | Status: DC

## 2023-11-25 MED ORDER — POTASSIUM CHLORIDE CRYS ER 20 MEQ PO TBCR: 20.0000 meq | EXTENDED_RELEASE_TABLET | Freq: Every day | ORAL | 0 refills | Status: DC

## 2023-11-25 MED ORDER — EMPAGLIFLOZIN 10 MG PO TABS
10.0000 mg | ORAL_TABLET | Freq: Every day | ORAL | 1 refills | Status: AC
  Filled 2023-11-25: qty 30, 30d supply, fill #0
  Filled 2024-01-04: qty 30, 30d supply, fill #1
  Filled 2024-02-29 – 2024-03-31 (×3): qty 30, 30d supply, fill #2

## 2023-11-25 NOTE — Progress Notes (Signed)
 Subjective:     Patient ID: Natasha Hudson, female    DOB: [birth year], 58 y.o.   MRN: [id number]  Chief Complaint  Patient presents with   Joint Swelling    Patient reports swelling on both ankles     HPI  History of Present Illness Natasha Hudson is a 58 year old female with congestive heart failure who presents with b/l LE swelling and nasal congestion. Reports compliance with medications.    Nasal congestion and soreness are present, particularly around her nose and eyes, likely due to frequent nose blowing. She has a history of seasonal allergies but is not on daily medication. Her nose is described as swollen and sore.  Blood pressure at home is around 130/80 mmHg, higher during clinic visits. Reports Blood sugar at home is stable at 100-103 mg/dL with no hypoglycemia. No shortness of breath is reported. Allergies are confirmed, with no current use of daily allergy medication.   Patient denies fever, chills, SOB, CP, palpitations, dyspnea,  HA, vision changes, N/V/D, abdominal pain, urinary symptoms, rash, weight changes, and recent illness or hospitalizations.   History of Present Illness              Health Maintenance Due  Topic Date Due   FOOT EXAM  Never done   Hepatitis C Screening  Never done   Mammogram  Never done   Colonoscopy  Never done   DTaP/Tdap/Td (2 - Tdap) 02/01/2012   Pneumococcal Vaccine: 50+ Years (2 of 2 - PCV) 02/06/2016   Influenza Vaccine  10/08/2023   COVID-19 Vaccine (4 - 2025-26 season) 11/08/2023    Past Medical History:  Diagnosis Date   CHF (congestive heart failure) (HCC)    Diabetes mellitus without complication (HCC)    DM type 2 (diabetes mellitus, type 2) (HCC)    Hypertension     Past Surgical History:  Procedure Laterality Date   RIGHT/LEFT HEART CATH AND CORONARY ANGIOGRAPHY N/A 07/23/2023   Procedure: RIGHT/LEFT HEART CATH AND CORONARY ANGIOGRAPHY;  Surgeon: Wonda Sharper, MD;  Location: [facility] INVASIVE CV LAB;   Service: Cardiovascular;  Laterality: N/A;   TRANSESOPHAGEAL ECHOCARDIOGRAM (CATH LAB) N/A 07/26/2023   Procedure: TRANSESOPHAGEAL ECHOCARDIOGRAM;  Surgeon: Francyne Headland, MD;  Location: [facility] INVASIVE CV LAB;  Service: Cardiovascular;  Laterality: N/A;    Family History  Problem Relation Age of Onset   Hypertension Mother    Diabetes Mother    Diabetes Sister    Diabetes Brother    Diabetes Other    Hypertension Other     Social History   Socioeconomic History   Marital status: Single    Spouse name: Not on file   Number of children: Not on file   Years of education: Not on file   Highest education level: Not on file  Occupational History   Not on file  Tobacco Use   Smoking status: Never   Smokeless tobacco: Never  Vaping Use   Vaping status: Never Used  Substance and Sexual Activity   Alcohol use: No   Drug use: No   Sexual activity: Not on file  Other Topics Concern   Not on file  Social History Narrative   Not on file   Social Drivers of Health   Financial Resource Strain: Not on file  Food Insecurity: No Food Insecurity (09/14/2023)   Hunger Vital Sign    Worried About Running Out of Food in the Last Year: Never true    Ran Out of Food in the  Last Year: Never true  Transportation Needs: No Transportation Needs (09/14/2023)   PRAPARE - Administrator, Civil Service (Medical): No    Lack of Transportation (Non-Medical): No  Physical Activity: Not on file  Stress: Not on file  Social Connections: Unknown (07/19/2023)   Social Connection and Isolation Panel    Frequency of Communication with Friends and Family: Three times a week    Frequency of Social Gatherings with Friends and Family: Three times a week    Attends Religious Services: 1 to 4 times per year    Active Member of Clubs or Organizations: No    Attends Banker Meetings: Never    Marital Status: Patient declined  Intimate Partner Violence: Not At Risk (09/14/2023)    Humiliation, Afraid, Rape, and Kick questionnaire    Fear of Current or Ex-Partner: No    Emotionally Abused: No    Physically Abused: No    Sexually Abused: No    Outpatient Medications Prior to Visit  Medication Sig Dispense Refill   ACCU-CHEK GUIDE TEST test strip USE TO CHECK BLOOD SUGAR THREE TIMES DAILY 100 strip 0   albuterol  (VENTOLIN  HFA) 108 (90 Base) MCG/ACT inhaler Inhale 1-2 puffs into the lungs every 4 (four) hours as needed for wheezing or shortness of breath. (Patient taking differently: Inhale 1-2 puffs into the lungs every 3 (three) hours as needed for wheezing or shortness of breath.) 8 g 2   BAYER LOW DOSE 81 MG tablet Take 81 mg by mouth in the morning. Swallow whole.     Blood Glucose Monitoring Suppl (BLOOD GLUCOSE MONITOR SYSTEM) w/Device KIT Use to check blood sugar as directed 1 kit 0   insulin  aspart (NOVOLOG ) 100 UNIT/ML FlexPen Inject 0-6 Units into the skin 3 (three) times daily with meals. Check Blood Glucose (BG) and inject per scale: BG <150= 0 unit; BG 150-200= 1 unit; BG 201-250= 2 unit; BG 251-300= 3 unit; BG 301-350= 4 unit; BG 351-400= 5 unit; BG >400= 6 unit and Call Primary Care. 15 mL 1   Insulin  Pen Needle (PEN NEEDLES) 31G X 5 MM MISC Use to inject insulin  3 times daily 100 each 0   Lancet Device MISC 1 each by Does not apply route 3 (three) times daily. May dispense any manufacturer covered by patient's insurance. 1 each 0   Lancets MISC Use to check blood sugar three times daily 100 each 0   LANTUS  SOLOSTAR 100 UNIT/ML Solostar Pen Inject 15 Units into the skin daily.     omeprazole  (PRILOSEC) 20 MG capsule Take 2 capsules (40 mg total) by mouth daily. 60 capsule 2   rosuvastatin  (CRESTOR ) 20 MG tablet Take 1 tablet (20 mg total) by mouth daily. 30 tablet 2   sacubitril -valsartan  (ENTRESTO ) 24-26 MG Take 1 tablet by mouth 2 (two) times daily. 60 tablet 3   sacubitril -valsartan  (ENTRESTO ) 24-26 MG Take 1 tablet by mouth 2 (two) times daily.      spironolactone  (ALDACTONE ) 25 MG tablet Take 0.5 tablets (12.5 mg total) by mouth every morning. 45 tablet 1   empagliflozin  (JARDIANCE ) 10 MG TABS tablet Take 1 tablet (10 mg total) by mouth daily. 30 tablet 2   furosemide  (LASIX ) 20 MG tablet Take 1 tablet (20 mg total) by mouth daily. 30 tablet 0   No facility-administered medications prior to visit.    Allergies  Allergen Reactions   Metformin Diarrhea and Other (See Comments)    Only with a high dose (diarrhea)  ROS See HPI    Objective:    Physical Exam  General: No acute distress. Awake and conversant.  Eyes: Normal conjunctiva, anicteric. Round symmetric pupils.  ENT: Hearing grossly intact. +nasal discharge, clear Respiratory: CTAB. Respirations are non-labored. No wheezing.  Skin: Warm. No rashes or ulcers.  Psych: Alert and oriented. Cooperative, Appropriate mood and affect, Normal judgment.  CV: RRR. No murmur. +2 B/L LE edema MSK: Normal ambulation. No clubbing or cyanosis.  Neuro:  CN II-XII grossly normal.     BP (!) 153/85 (BP Location: Right Arm, Patient Position: Sitting, Cuff Size: Small)   Pulse (!) 101   Resp 16   Ht 1' (0.305 m)   Wt 156 lb (70.8 kg)   LMP 10/03/2012   SpO2 100%   BMI 761.67 kg/m  Wt Readings from Last 3 Encounters:  11/25/23 156 lb (70.8 kg)  11/10/23 151 lb (68.5 kg)  10/22/23 150 lb (68 kg)       Assessment & Plan:   Problem List Items Addressed This Visit     CHF (congestive heart failure) (HCC) - Primary   Relevant Medications   furosemide  (LASIX ) 20 MG tablet   potassium chloride  SA (KLOR-CON  M) 20 MEQ tablet   Other Relevant Orders   Comp Met (CMET)   AMB referral to CHF clinic   Hypertension   Relevant Medications   furosemide  (LASIX ) 20 MG tablet   Perennial allergic rhinitis with seasonal variation   Relevant Medications   cetirizine  (ZYRTEC ) 10 MG tablet   fluticasone  (FLONASE ) 50 MCG/ACT nasal spray   Type 2 diabetes mellitus with hyperglycemia (HCC)    Relevant Medications   empagliflozin  (JARDIANCE ) 10 MG TABS tablet   Other Visit Diagnoses       Needs flu shot       Relevant Orders   Flu vaccine trivalent PF, 6mos and older(Flulaval,Afluria,Fluarix,Fluzone)        Heart failure Chronic heart failure with recent hospitalizations. Managed with Lasix . Transitioned to Entresto . Jardiance  prescribed; cost is a concern. - Refer to CHF clinic for specialized management. - Prescribe Lasix  40 mg daily for three days, then revert to 20 mg daily. - Prescribe potassium supplement for three days. - Check CMP after three days of increased Lasix . - Advise on using GoodRx for cost savings.  Hypertension Hypertension with home readings around 130/80 mmHg. Slightly elevated in clinic.  - Continue current blood pressure regimen. - Advise bringing home blood pressure monitor to next visit for calibration.  Type 2 diabetes mellitus Type 2 diabetes mellitus with blood glucose levels around 100-103 mg/dL. Jardiance  prescribed; cost is a concern. - Continue monitoring blood glucose levels at home. - Send Jardiance  prescription to preferred pharmacy with a 30-day supply. - Advise on using GoodRx for cost savings.  Allergic rhinitis Chronic allergic rhinitis with nasal congestion and soreness. No current daily medication use. - Recommend daily Zyrtec . - Prescribe Flonase  nasal spray for daily use. - Advise on proper Flonase  administration technique. - Monitor symptoms and return if worsening or persistent symptoms  I have discontinued Herman Roesler's empagliflozin . I have also changed her empagliflozin . Additionally, I am having her start on potassium chloride  SA, cetirizine , and fluticasone . Lastly, I am having her maintain her Blood Glucose Monitor System, Lancet Device, Lancets, Pen Needles, albuterol , insulin  aspart, Bayer Low Dose, rosuvastatin , spironolactone , omeprazole , Accu-Chek Guide Test, Lantus  SoloStar, sacubitril -valsartan ,  sacubitril -valsartan , and furosemide .  Meds ordered this encounter  Medications   furosemide  (LASIX ) 20 MG tablet  Sig: Take 1 tablet (20 mg total) by mouth daily.    Dispense:  90 tablet    Refill:  1    Supervising Provider:   DOMENICA BLACKBIRD A [[id number]]   potassium chloride  SA (KLOR-CON  M) 20 MEQ tablet    Sig: Take 1 tablet (20 mEq total) by mouth daily for 3 days.    Dispense:  10 tablet    Refill:  0    Supervising Provider:   DOMENICA BLACKBIRD A [[id number]]   cetirizine  (ZYRTEC ) 10 MG tablet    Sig: Take 1 tablet (10 mg total) by mouth daily.    Dispense:  30 tablet    Refill:  3    Supervising Provider:   DOMENICA BLACKBIRD A [[id number]]   fluticasone  (FLONASE ) 50 MCG/ACT nasal spray    Sig: Place 2 sprays into both nostrils daily.    Dispense:  16 g    Refill:  6    Supervising Provider:   DOMENICA BLACKBIRD A [[id number]]   empagliflozin  (JARDIANCE ) 10 MG TABS tablet    Sig: Take 1 tablet (10 mg total) by mouth daily before breakfast.    Dispense:  90 tablet    Refill:  1    Supervising Provider:   DOMENICA BLACKBIRD A [[id number]]

## 2023-12-09 ENCOUNTER — Encounter: Payer: Self-pay | Admitting: Pulmonary Disease

## 2023-12-09 ENCOUNTER — Ambulatory Visit (INDEPENDENT_AMBULATORY_CARE_PROVIDER_SITE_OTHER): Admitting: Pulmonary Disease

## 2023-12-09 VITALS — BP 122/80 | HR 96 | Temp 98.4°F | Ht 65.0 in | Wt 149.0 lb

## 2023-12-09 DIAGNOSIS — J849 Interstitial pulmonary disease, unspecified: Secondary | ICD-10-CM

## 2023-12-09 DIAGNOSIS — J4520 Mild intermittent asthma, uncomplicated: Secondary | ICD-10-CM

## 2023-12-09 NOTE — Progress Notes (Signed)
 Natasha Hudson    [id number]    04/05/1965  Primary Care Physician:Yacopino, Harlene CROME, NP  Referring Physician: Wheeler Harlene CROME, NP [address] [place],  [place] [address]  Chief complaint:    HPI: 58 y.o. who  has a past medical history of CHF (congestive heart failure) (HCC), Diabetes mellitus without complication (HCC), DM type 2 (diabetes mellitus, type 2) (HCC), and Hypertension.  Discussed the use of AI scribe software for clinical note transcription with the patient, who gave verbal consent to proceed.  History of Present Illness Referred for evaluation of asthma, dyspnea She has a history of mild intermittent asthma and has been on albuterol   Hospitalization in May 2025 for acute hypoxic respiratory failure Noted to have multilobar pneumonia treated with antibiotics and also heart failure Negative for COVID flu, RSV.  Dilated cardiomyopathy, systolic heart failure Noted to have heart failure with a EF of 30% Follows with Dr. Bernie, cardiology Currently being managed with Entresto , Jardiance , Lasix    Relevant Pulmonary history: Pets: No pets Occupation: Works on Theatre stage manager for [employer] Exposures: No mold, hot tub, Jacuzzi.  No feather pillows or comforters No h/o chemo/XRT/amiodarone/macrodantin/MTX  No exposure to asbestos, silica or other organic allergens  Smoking history: Never smoker Travel history: No significant travel history Family history: No family history of lung disease   Outpatient Encounter Medications as of 12/09/2023  Medication Sig   ACCU-CHEK GUIDE TEST test strip USE TO CHECK BLOOD SUGAR THREE TIMES DAILY   albuterol  (VENTOLIN  HFA) 108 (90 Base) MCG/ACT inhaler Inhale 1-2 puffs into the lungs every 4 (four) hours as needed for wheezing or shortness of breath.   BAYER LOW DOSE 81 MG tablet Take 81 mg by mouth in the morning. Swallow whole.   Blood Glucose Monitoring Suppl (BLOOD GLUCOSE MONITOR SYSTEM)  w/Device KIT Use to check blood sugar as directed   cetirizine  (ZYRTEC ) 10 MG tablet Take 1 tablet (10 mg total) by mouth daily.   empagliflozin  (JARDIANCE ) 10 MG TABS tablet Take 1 tablet (10 mg total) by mouth daily before breakfast.   fluticasone  (FLONASE ) 50 MCG/ACT nasal spray Place 2 sprays into both nostrils daily.   furosemide  (LASIX ) 20 MG tablet Take 1 tablet (20 mg total) by mouth daily.   insulin  aspart (NOVOLOG ) 100 UNIT/ML FlexPen Inject 0-6 Units into the skin 3 (three) times daily with meals. Check Blood Glucose (BG) and inject per scale: BG <150= 0 unit; BG 150-200= 1 unit; BG 201-250= 2 unit; BG 251-300= 3 unit; BG 301-350= 4 unit; BG 351-400= 5 unit; BG >400= 6 unit and Call Primary Care.   Insulin  Pen Needle (PEN NEEDLES) 31G X 5 MM MISC Use to inject insulin  3 times daily   Lancet Device MISC 1 each by Does not apply route 3 (three) times daily. May dispense any manufacturer covered by patient's insurance.   Lancets MISC Use to check blood sugar three times daily   LANTUS  SOLOSTAR 100 UNIT/ML Solostar Pen Inject 15 Units into the skin daily.   omeprazole  (PRILOSEC) 20 MG capsule Take 2 capsules (40 mg total) by mouth daily.   potassium chloride  SA (KLOR-CON  M) 20 MEQ tablet Take 1 tablet (20 mEq total) by mouth daily for 3 days.   rosuvastatin  (CRESTOR ) 20 MG tablet Take 1 tablet (20 mg total) by mouth daily.   sacubitril -valsartan  (ENTRESTO ) 24-26 MG Take 1 tablet by mouth 2 (two) times daily.   spironolactone  (ALDACTONE ) 25  MG tablet Take 0.5 tablets (12.5 mg total) by mouth every morning.   sacubitril -valsartan  (ENTRESTO ) 24-26 MG Take 1 tablet by mouth 2 (two) times daily. (Patient not taking: Reported on 12/09/2023)   No facility-administered encounter medications on file as of 12/09/2023.     Physical Exam: Today's Vitals   12/09/23 1555  BP: 122/80  Pulse: 96  Temp: 98.4 F (36.9 C)  TempSrc: Oral  SpO2: 99%  Weight: 149 lb (67.6 kg)  Height: 5' 5 (1.651 m)    Body mass index is 24.79 kg/m.  Physical Exam Gen:      No acute distress HEENT:  EOMI, sclera anicteric Neck:     No masses; no thyromegaly Lungs:    Clear to auscultation bilaterally; normal respiratory effort CV:         Regular rate and rhythm; no murmurs Abd:      + bowel sounds; soft, non-tender; no palpable masses, no distension Ext:    No edema; adequate peripheral perfusion Neuro: alert and oriented x 3 Psych: normal mood and affect   Data Reviewed: Imaging: CTA 07/18/2023-no pulmonary embolism.  Multifocal bilateral upper lobe infiltrate.  Bibasilar atelectasis/infiltrate, large bilateral pleural effusion Chest x-ray 09/06/2023-congestive heart failure, bibasilar atelectasis Chest x-ray 10/22/2023-clear lungs with no pleural effusion I have reviewed the images personally.  PFTs:  Labs: CBC 09/24/2023-WBC 8.3, eos 0.7, absolute eosinophil count 58  Cardiac: Echocardiogram 07/26/2023-LVEF 30%, global hypokinesis.  Normal RV systolic function and size.  Mildly elevated PA systolic pressure  Cardiac cath 07/23/2023- 1.  Angiographically normal coronary arteries (right dominant) 2.  Right heart catheterization data: Right atrial pressure mean 3 mmHg RV pressure 32 over 3 mmHg PA pressure 35/14 mean 20 mmHg Pulmonary capillary wedge pressure A-wave 14, V wave 15, mean 13 mmHg LVEDP 21 mmHg Cardiac output 5.2 L/min, cardiac index 3.1 L/min/m   Assessment & Plan Mild intermittent asthma Currently on just albuterol .  Suspect her dyspnea is mostly related to pulm edema from heart failure which appears to be improving with treatment. Will get CBC differential, IgE and PFTs for further evaluation  Hospitalization for pneumonia Was hospitalized for multilobar pneumonia and May 2025 Get follow-up CT to ensure resolution of infiltrate and make sure there is no underlying interstitial lung disease  Recommendations: Continue albuterol  CBC with differential,  IgE PFTs High-res CT  Lonna Coder MD [facility] 12/09/2023, 4:05 PM  CC: Wheeler Harlene CROME, NP

## 2023-12-09 NOTE — Patient Instructions (Signed)
 Will get some labs today Will check follow-up CT scan and get pulmonary function test for better evaluation of your lung Continue the albuterol  as needed Follow-up in 6 months

## 2023-12-10 LAB — CBC WITH DIFFERENTIAL/PLATELET
Basophils Absolute: 0.1 K/uL (ref 0.0–0.1)
Basophils Relative: 1 % (ref 0.0–3.0)
Eosinophils Absolute: 0.1 K/uL (ref 0.0–0.7)
Eosinophils Relative: 1.3 % (ref 0.0–5.0)
HCT: 42.4 % (ref 36.0–46.0)
Hemoglobin: 13.2 g/dL (ref 12.0–15.0)
Lymphocytes Relative: 28.1 % (ref 12.0–46.0)
Lymphs Abs: 2 K/uL (ref 0.7–4.0)
MCHC: 31.2 g/dL (ref 30.0–36.0)
MCV: 83.1 fl (ref 78.0–100.0)
Monocytes Absolute: 0.4 K/uL (ref 0.1–1.0)
Monocytes Relative: 6.4 % (ref 3.0–12.0)
Neutro Abs: 4.4 K/uL (ref 1.4–7.7)
Neutrophils Relative %: 63.2 % (ref 43.0–77.0)
Platelets: 374 K/uL (ref 150.0–400.0)
RBC: 5.11 Mil/uL (ref 3.87–5.11)
RDW: 16.3 % — ABNORMAL HIGH (ref 11.5–15.5)
WBC: 7 K/uL (ref 4.0–10.5)

## 2023-12-10 LAB — IGE: IgE (Immunoglobulin E), Serum: 18 kU/L (ref <=114)

## 2023-12-16 ENCOUNTER — Ambulatory Visit
Admission: RE | Admit: 2023-12-16 | Discharge: 2023-12-16 | Disposition: A | Discharge status: Home / Self Care | Source: Ambulatory Visit | Attending: Pulmonary Disease | Admitting: Pulmonary Disease

## 2023-12-16 ENCOUNTER — Telehealth: Payer: Self-pay | Admitting: Cardiology

## 2023-12-16 DIAGNOSIS — J4520 Mild intermittent asthma, uncomplicated: Secondary | ICD-10-CM

## 2023-12-16 MED ORDER — SPIRONOLACTONE 25 MG PO TABS: 12.5000 mg | ORAL_TABLET | Freq: Every morning | ORAL | 3 refills | Status: AC

## 2023-12-16 MED ORDER — ROSUVASTATIN CALCIUM 20 MG PO TABS: 20.0000 mg | ORAL_TABLET | Freq: Every day | ORAL | 3 refills | Status: AC

## 2023-12-16 NOTE — Telephone Encounter (Signed)
 *  STAT* If patient is at the pharmacy, call can be transferred to refill team.   1. Which medications need to be refilled? (please list name of each medication and dose if known)   rosuvastatin  (CRESTOR ) 20 MG tablet  spironolactone  (ALDACTONE ) 25 MG tablet   2. Which pharmacy/location (including street and city if local pharmacy) is medication to be sent to?  [id number] - [place], [place] - [address]   3. Do they need a 30 day or 90 day supply?  30 days

## 2023-12-16 NOTE — Telephone Encounter (Signed)
 Pt's medications were sent to pt's pharmacy as requested. Confirmation received.

## 2023-12-22 ENCOUNTER — Ambulatory Visit: Payer: Self-pay | Admitting: Pulmonary Disease

## 2023-12-23 ENCOUNTER — Encounter: Payer: Self-pay | Admitting: Cardiology

## 2023-12-23 ENCOUNTER — Ambulatory Visit: Attending: Cardiology | Admitting: Cardiology

## 2023-12-23 VITALS — BP 120/86 | HR 104 | Ht 65.0 in | Wt 152.0 lb

## 2023-12-23 DIAGNOSIS — I10 Essential (primary) hypertension: Secondary | ICD-10-CM

## 2023-12-23 DIAGNOSIS — I420 Dilated cardiomyopathy: Secondary | ICD-10-CM

## 2023-12-23 DIAGNOSIS — I447 Left bundle-branch block, unspecified: Secondary | ICD-10-CM

## 2023-12-23 DIAGNOSIS — E782 Mixed hyperlipidemia: Secondary | ICD-10-CM | POA: Diagnosis not present

## 2023-12-23 DIAGNOSIS — R0609 Other forms of dyspnea: Secondary | ICD-10-CM

## 2023-12-23 DIAGNOSIS — E1165 Type 2 diabetes mellitus with hyperglycemia: Secondary | ICD-10-CM | POA: Diagnosis not present

## 2023-12-23 DIAGNOSIS — I509 Heart failure, unspecified: Secondary | ICD-10-CM

## 2023-12-23 MED ORDER — FUROSEMIDE 20 MG PO TABS: 40.0000 mg | ORAL_TABLET | Freq: Every day | ORAL | 1 refills | Status: DC

## 2023-12-23 NOTE — Patient Instructions (Addendum)
 Medication Instructions:   INCREASE: Lasix  to 40mg  daily    Lab Work: 3rd Floor [address] ProBNP- BMP- today If you have labs (blood work) drawn today and your tests are completely normal, you will receive your results only by: MyChart Message (if you have MyChart) OR A paper copy in the mail If you have any lab test that is abnormal or we need to change your treatment, we will call you to review the results.   Testing/Procedures: None Ordered   Follow-Up: At [facility], you and your health needs are our priority.  As part of our continuing mission to provide you with exceptional heart care, we have created designated Provider Care Teams.  These Care Teams include your primary Cardiologist (physician) and Advanced Practice Providers (APPs -  Physician Assistants and Nurse Practitioners) who all work together to provide you with the care you need, when you need it.  We recommend signing up for the patient portal called MyChart.  Sign up information is provided on this After Visit Summary.  MyChart is used to connect with patients for Virtual Visits (Telemedicine).  Patients are able to view lab/test results, encounter notes, upcoming appointments, etc.  Non-urgent messages can be sent to your provider as well.   To learn more about what you can do with MyChart, go to [email or web address].    Your next appointment:   Wed Oct 22nd  The format for your next appointment:   In Person  Provider:   Lamar Fitch, MD    Other Instructions NA

## 2023-12-23 NOTE — Progress Notes (Signed)
 Cardiology Office Note:    Date:  12/23/2023   ID:  Natasha Hudson, DOB August 23, 1965, MRN [id number]  PCP:  Wheeler Harlene CROME, NP  Cardiologist:  Lamar Fitch, MD    Referring MD: Wheeler Harlene CROME, NP   Chief Complaint  Patient presents with   Follow-up  Doing fine  History of Present Illness:    Natasha Hudson is a 58 y.o. female nonischemic cardiomyopathy, left bundle branch block, diabetes, essential hypertension.  Comes today to months for regular follow-up and titration of the medications.  Doing fine does have some swelling of lower extremities admits also the fact that she drinks a lot of water.  No chest pain tightness squeezing pressure burning chest  Past Medical History:  Diagnosis Date   CHF (congestive heart failure) (HCC)    Diabetes mellitus without complication (HCC)    DM type 2 (diabetes mellitus, type 2) (HCC)    Hypertension     Past Surgical History:  Procedure Laterality Date   RIGHT/LEFT HEART CATH AND CORONARY ANGIOGRAPHY N/A 07/23/2023   Procedure: RIGHT/LEFT HEART CATH AND CORONARY ANGIOGRAPHY;  Surgeon: Wonda Sharper, MD;  Location: [facility] INVASIVE CV LAB;  Service: Cardiovascular;  Laterality: N/A;   TRANSESOPHAGEAL ECHOCARDIOGRAM (CATH LAB) N/A 07/26/2023   Procedure: TRANSESOPHAGEAL ECHOCARDIOGRAM;  Surgeon: Francyne Headland, MD;  Location: [facility] INVASIVE CV LAB;  Service: Cardiovascular;  Laterality: N/A;    Current Medications: Current Meds  Medication Sig   ACCU-CHEK GUIDE TEST test strip USE TO CHECK BLOOD SUGAR THREE TIMES DAILY   albuterol  (VENTOLIN  HFA) 108 (90 Base) MCG/ACT inhaler Inhale 1-2 puffs into the lungs every 4 (four) hours as needed for wheezing or shortness of breath.   BAYER LOW DOSE 81 MG tablet Take 81 mg by mouth in the morning. Swallow whole.   Blood Glucose Monitoring Suppl (BLOOD GLUCOSE MONITOR SYSTEM) w/Device KIT Use to check blood sugar as directed   cetirizine  (ZYRTEC ) 10 MG tablet Take 1 tablet (10 mg total) by  mouth daily.   empagliflozin  (JARDIANCE ) 10 MG TABS tablet Take 1 tablet (10 mg total) by mouth daily before breakfast.   fluticasone  (FLONASE ) 50 MCG/ACT nasal spray Place 2 sprays into both nostrils daily.   furosemide  (LASIX ) 20 MG tablet Take 1 tablet (20 mg total) by mouth daily.   insulin  aspart (NOVOLOG ) 100 UNIT/ML FlexPen Inject 0-6 Units into the skin 3 (three) times daily with meals. Check Blood Glucose (BG) and inject per scale: BG <150= 0 unit; BG 150-200= 1 unit; BG 201-250= 2 unit; BG 251-300= 3 unit; BG 301-350= 4 unit; BG 351-400= 5 unit; BG >400= 6 unit and Call Primary Care.   Insulin  Pen Needle (PEN NEEDLES) 31G X 5 MM MISC Use to inject insulin  3 times daily   Lancet Device MISC 1 each by Does not apply route 3 (three) times daily. May dispense any manufacturer covered by patient's insurance.   Lancets MISC Use to check blood sugar three times daily   LANTUS  SOLOSTAR 100 UNIT/ML Solostar Pen Inject 15 Units into the skin daily.   omeprazole  (PRILOSEC) 20 MG capsule Take 2 capsules (40 mg total) by mouth daily.   rosuvastatin  (CRESTOR ) 20 MG tablet Take 1 tablet (20 mg total) by mouth daily.   sacubitril -valsartan  (ENTRESTO ) 24-26 MG Take 1 tablet by mouth 2 (two) times daily.   spironolactone  (ALDACTONE ) 25 MG tablet Take 0.5 tablets (12.5 mg total) by mouth every morning.     Allergies:   Metformin   Social History  Socioeconomic History   Marital status: Single    Spouse name: Not on file   Number of children: Not on file   Years of education: Not on file   Highest education level: Not on file  Occupational History   Not on file  Tobacco Use   Smoking status: Never   Smokeless tobacco: Never  Vaping Use   Vaping status: Never Used  Substance and Sexual Activity   Alcohol use: No   Drug use: No   Sexual activity: Not on file  Other Topics Concern   Not on file  Social History Narrative   Not on file   Social Drivers of Health   Financial Resource  Strain: Not on file  Food Insecurity: No Food Insecurity (09/14/2023)   Hunger Vital Sign    Worried About Running Out of Food in the Last Year: Never true    Ran Out of Food in the Last Year: Never true  Transportation Needs: No Transportation Needs (09/14/2023)   PRAPARE - Administrator, Civil Service (Medical): No    Lack of Transportation (Non-Medical): No  Physical Activity: Not on file  Stress: Not on file  Social Connections: Unknown (07/19/2023)   Social Connection and Isolation Panel    Frequency of Communication with Friends and Family: Three times a week    Frequency of Social Gatherings with Friends and Family: Three times a week    Attends Religious Services: 1 to 4 times per year    Active Member of Clubs or Organizations: No    Attends Banker Meetings: Never    Marital Status: Patient declined     Family History: The patient's family history includes Diabetes in her brother, mother, sister, and another family member; Hypertension in her mother and another family member. ROS:   Please see the history of present illness.    All 14 point review of systems negative except as described per history of present illness  EKGs/Labs/Other Studies Reviewed:         Recent Labs: 09/08/2023: Magnesium 1.8 09/24/2023: ALT 47 10/22/2023: B Natriuretic Peptide 1,356.8 11/19/2023: BUN 18; Creatinine, Ser 0.88; NT-Pro BNP 2,222; Potassium 4.6; Sodium 145 12/09/2023: Hemoglobin 13.2; Platelets 374.0  Recent Lipid Panel    Component Value Date/Time   CHOL 152 07/20/2023 0403   TRIG 73 07/20/2023 0403   HDL 41 07/20/2023 0403   CHOLHDL 3.7 07/20/2023 0403   VLDL 15 07/20/2023 0403   LDLCALC 96 07/20/2023 0403    Physical Exam:    VS:  BP 120/86   Pulse (!) 104   Ht 5' 5 (1.651 m)   Wt 152 lb (68.9 kg)   LMP 10/03/2012   SpO2 94%   BMI 25.29 kg/m     Wt Readings from Last 3 Encounters:  12/23/23 152 lb (68.9 kg)  12/09/23 149 lb (67.6 kg)   11/25/23 156 lb (70.8 kg)     GEN:  Well nourished, well developed in no acute distress HEENT: Normal NECK: No JVD; No carotid bruits LYMPHATICS: No lymphadenopathy CARDIAC: RRR, no murmurs, no rubs, no gallops RESPIRATORY:  Clear to auscultation without rales, wheezing or rhonchi  ABDOMEN: Soft, non-tender, non-distended MUSCULOSKELETAL: Mild swelling; No deformity  SKIN: Warm and dry LOWER EXTREMITIES: no swelling NEUROLOGIC:  Alert and oriented x 3 PSYCHIATRIC:  Normal affect   ASSESSMENT:    1. Dilated cardiomyopathy (HCC)   2. LBBB (left bundle branch block)   3. Type 2 diabetes mellitus with  hyperglycemia, unspecified whether long term insulin  use (HCC)   4. Mixed hyperlipidemia    PLAN:    In order of problems listed above:  A cardiomyopathy, trying to put her on guideline directed medical therapy gradually, today however she does have some swelling of lower extremities I will simply double the dose of Lasix  Lasix , will check Chem-7. Will put her on beta-blocker.  And also will try to increase dose of Entresto . Will continue monitoring, within next few months will repeat echocardiogram Essential hypertension: Blood pressure well-controlled   Medication Adjustments/Labs and Tests Ordered: Current medicines are reviewed at length with the patient today.  Concerns regarding medicines are outlined above.  No orders of the defined types were placed in this encounter.  Medication changes: No orders of the defined types were placed in this encounter.   Signed, Lamar DOROTHA Fitch, MD, [facility] 12/23/2023 2:30 PM    [facility]

## 2023-12-24 LAB — BASIC METABOLIC PANEL WITH GFR
BUN/Creatinine Ratio: 14 (ref 12–28)
BUN: 13 mg/dL (ref 8–27)
CO2: 28 mmol/L (ref 20–29)
Calcium: 9.2 mg/dL (ref 8.7–10.3)
Chloride: 102 mmol/L (ref 96–106)
Creatinine, Ser: 0.94 mg/dL (ref 0.57–1.00)
Glucose: 157 mg/dL — ABNORMAL HIGH (ref 70–99)
Potassium: 3.4 mmol/L — ABNORMAL LOW (ref 3.5–5.2)
Sodium: 144 mmol/L (ref 134–144)
eGFR: 69 mL/min/1.73 (ref >59)

## 2023-12-24 LAB — PRO B NATRIURETIC PEPTIDE: NT-Pro BNP: 3449 pg/mL — ABNORMAL HIGH (ref 0–287)

## 2023-12-26 ENCOUNTER — Other Ambulatory Visit: Payer: Self-pay | Admitting: Student

## 2023-12-26 DIAGNOSIS — R0602 Shortness of breath: Secondary | ICD-10-CM

## 2023-12-29 ENCOUNTER — Encounter: Payer: Self-pay | Admitting: Cardiology

## 2023-12-29 ENCOUNTER — Ambulatory Visit: Attending: Cardiology | Admitting: Cardiology

## 2023-12-29 VITALS — BP 120/90 | HR 100 | Ht 65.0 in | Wt 156.0 lb

## 2023-12-29 DIAGNOSIS — I420 Dilated cardiomyopathy: Secondary | ICD-10-CM | POA: Diagnosis not present

## 2023-12-29 DIAGNOSIS — I509 Heart failure, unspecified: Secondary | ICD-10-CM | POA: Diagnosis not present

## 2023-12-29 DIAGNOSIS — I447 Left bundle-branch block, unspecified: Secondary | ICD-10-CM | POA: Diagnosis not present

## 2023-12-29 MED ORDER — POTASSIUM CHLORIDE CRYS ER 20 MEQ PO TBCR: 20.0000 meq | EXTENDED_RELEASE_TABLET | Freq: Every day | ORAL | 3 refills | Status: DC

## 2023-12-29 MED ORDER — FUROSEMIDE 40 MG PO TABS: 40.0000 mg | ORAL_TABLET | Freq: Every day | ORAL | 3 refills | Status: DC

## 2023-12-29 NOTE — Progress Notes (Unsigned)
 Cardiology Office Note:    Date:  12/29/2023   ID:  Natasha Hudson, DOB 26-Jun-1965, MRN [id number]  PCP:  Wheeler Harlene CROME, NP  Cardiologist:  Lamar Fitch, MD    Referring MD: Wheeler Harlene CROME, NP   Chief Complaint  Patient presents with   Follow-up  I still have swollen legs  History of Present Illness:    Natasha Hudson is a 58 y.o. female past medical history significant for nonischemic cardiomyopathy, left bundle branch block, be tried to put her on guideline directed medical therapy however lately restart having difficulty with fluid management I increased dose of Lasix  last time But Not Enough Today She Still Have 1-2+ Swelling of Lower Extremities  Past Medical History:  Diagnosis Date   CHF (congestive heart failure) (HCC)    Diabetes mellitus without complication (HCC)    DM type 2 (diabetes mellitus, type 2) (HCC)    Hypertension     Past Surgical History:  Procedure Laterality Date   RIGHT/LEFT HEART CATH AND CORONARY ANGIOGRAPHY N/A 07/23/2023   Procedure: RIGHT/LEFT HEART CATH AND CORONARY ANGIOGRAPHY;  Surgeon: Wonda Sharper, MD;  Location: [facility] INVASIVE CV LAB;  Service: Cardiovascular;  Laterality: N/A;   TRANSESOPHAGEAL ECHOCARDIOGRAM (CATH LAB) N/A 07/26/2023   Procedure: TRANSESOPHAGEAL ECHOCARDIOGRAM;  Surgeon: Francyne Headland, MD;  Location: [facility] INVASIVE CV LAB;  Service: Cardiovascular;  Laterality: N/A;    Current Medications: Current Meds  Medication Sig   ACCU-CHEK GUIDE TEST test strip USE TO CHECK BLOOD SUGAR THREE TIMES DAILY   albuterol  (VENTOLIN  HFA) 108 (90 Base) MCG/ACT inhaler INHALE 1-2 PUFFS INTO THE LUNGS EVERY 4 HOURS AS NEEDED FOR WHEEZING OR SHORTNESS OF BREATH.   BAYER LOW DOSE 81 MG tablet Take 81 mg by mouth in the morning. Swallow whole.   Blood Glucose Monitoring Suppl (BLOOD GLUCOSE MONITOR SYSTEM) w/Device KIT Use to check blood sugar as directed   cetirizine  (ZYRTEC ) 10 MG tablet Take 1 tablet (10 mg total) by mouth  daily.   empagliflozin  (JARDIANCE ) 10 MG TABS tablet Take 1 tablet (10 mg total) by mouth daily before breakfast.   fluticasone  (FLONASE ) 50 MCG/ACT nasal spray Place 2 sprays into both nostrils daily.   furosemide  (LASIX ) 20 MG tablet Take 2 tablets (40 mg total) by mouth daily.   insulin  aspart (NOVOLOG ) 100 UNIT/ML FlexPen Inject 0-6 Units into the skin 3 (three) times daily with meals. Check Blood Glucose (BG) and inject per scale: BG <150= 0 unit; BG 150-200= 1 unit; BG 201-250= 2 unit; BG 251-300= 3 unit; BG 301-350= 4 unit; BG 351-400= 5 unit; BG >400= 6 unit and Call Primary Care.   Insulin  Pen Needle (PEN NEEDLES) 31G X 5 MM MISC Use to inject insulin  3 times daily   Lancet Device MISC 1 each by Does not apply route 3 (three) times daily. May dispense any manufacturer covered by patient's insurance.   Lancets MISC Use to check blood sugar three times daily   LANTUS  SOLOSTAR 100 UNIT/ML Solostar Pen Inject 15 Units into the skin daily.   omeprazole  (PRILOSEC) 20 MG capsule Take 2 capsules (40 mg total) by mouth daily.   rosuvastatin  (CRESTOR ) 20 MG tablet Take 1 tablet (20 mg total) by mouth daily.   sacubitril -valsartan  (ENTRESTO ) 24-26 MG Take 1 tablet by mouth 2 (two) times daily.   spironolactone  (ALDACTONE ) 25 MG tablet Take 0.5 tablets (12.5 mg total) by mouth every morning.     Allergies:   Metformin   Social History   Socioeconomic  History   Marital status: Single    Spouse name: Not on file   Number of children: Not on file   Years of education: Not on file   Highest education level: Not on file  Occupational History   Not on file  Tobacco Use   Smoking status: Never   Smokeless tobacco: Never  Vaping Use   Vaping status: Never Used  Substance and Sexual Activity   Alcohol use: No   Drug use: No   Sexual activity: Not on file  Other Topics Concern   Not on file  Social History Narrative   Not on file   Social Drivers of Health   Financial Resource Strain:  Not on file  Food Insecurity: No Food Insecurity (09/14/2023)   Hunger Vital Sign    Worried About Running Out of Food in the Last Year: Never true    Ran Out of Food in the Last Year: Never true  Transportation Needs: No Transportation Needs (09/14/2023)   PRAPARE - Administrator, Civil Service (Medical): No    Lack of Transportation (Non-Medical): No  Physical Activity: Not on file  Stress: Not on file  Social Connections: Unknown (07/19/2023)   Social Connection and Isolation Panel    Frequency of Communication with Friends and Family: Three times a week    Frequency of Social Gatherings with Friends and Family: Three times a week    Attends Religious Services: 1 to 4 times per year    Active Member of Clubs or Organizations: No    Attends Banker Meetings: Never    Marital Status: Patient declined     Family History: The patient's family history includes Diabetes in her brother, mother, sister, and another family member; Hypertension in her mother and another family member. ROS:   Please see the history of present illness.    All 14 point review of systems negative except as described per history of present illness  EKGs/Labs/Other Studies Reviewed:         Recent Labs: 09/08/2023: Magnesium 1.8 09/24/2023: ALT 47 10/22/2023: B Natriuretic Peptide 1,356.8 12/09/2023: Hemoglobin 13.2; Platelets 374.0 12/23/2023: BUN 13; Creatinine, Ser 0.94; NT-Pro BNP 3,449; Potassium 3.4; Sodium 144  Recent Lipid Panel    Component Value Date/Time   CHOL 152 07/20/2023 0403   TRIG 73 07/20/2023 0403   HDL 41 07/20/2023 0403   CHOLHDL 3.7 07/20/2023 0403   VLDL 15 07/20/2023 0403   LDLCALC 96 07/20/2023 0403    Physical Exam:    VS:  BP (!) 120/90   Pulse 100   Ht 5' 5 (1.651 m)   Wt 156 lb (70.8 kg)   LMP 10/03/2012   SpO2 98%   BMI 25.96 kg/m     Wt Readings from Last 3 Encounters:  12/29/23 156 lb (70.8 kg)  12/23/23 152 lb (68.9 kg)  12/09/23 149  lb (67.6 kg)     GEN:  Well nourished, well developed in no acute distress HEENT: Normal NECK: No JVD; No carotid bruits LYMPHATICS: No lymphadenopathy CARDIAC: RRR, no murmurs, no rubs, no gallops RESPIRATORY:  Clear to auscultation without rales, wheezing or rhonchi  ABDOMEN: Soft, non-tender, non-distended MUSCULOSKELETAL:  No edema; No deformity  SKIN: Warm and dry LOWER EXTREMITIES: 2+ swelling NEUROLOGIC:  Alert and oriented x 3 PSYCHIATRIC:  Normal affect   ASSESSMENT:    1. LBBB (left bundle branch block)   2. Congestive heart failure, unspecified HF chronicity, unspecified heart failure type (HCC)  3. Dilated cardiomyopathy (HCC)    PLAN:    In order of problems listed above:  Congestive heart failure, swelling is worse.  Will increase Lasix  to 80 mg she is going to think 40 morning 40 mainly afternoon, I will give her 20 mg potassium I see her back in my office next week and I will ask her to have Chem-7 done a few days before. Left bundle branch block chronic. Cardiomyopathy noted   Medication Adjustments/Labs and Tests Ordered: Current medicines are reviewed at length with the patient today.  Concerns regarding medicines are outlined above.  No orders of the defined types were placed in this encounter.  Medication changes: No orders of the defined types were placed in this encounter.   Signed, Lamar DOROTHA Fitch, MD, [facility] 12/29/2023 4:00 PM    [facility]

## 2023-12-29 NOTE — Patient Instructions (Addendum)
 Medication Instructions:   START: Lasix  40mg  twice daily  START: Potassium 20mEq 1 daily   Lab Work: 3rd Floor   [address]  Your physician recommends that you return for lab work in: Tuesday or Wednesday You need to have labs done when you are fasting.  You can come Monday through Friday 8:00 am to 11:30AM and 1:00 to 4:00. You do not need to make an appointment as the order has already been placed.   Testing/Procedures: None Ordered   Follow-Up: At [facility], you and your health needs are our priority.  As part of our continuing mission to provide you with exceptional heart care, we have created designated Provider Care Teams.  These Care Teams include your primary Cardiologist (physician) and Advanced Practice Providers (APPs -  Physician Assistants and Nurse Practitioners) who all work together to provide you with the care you need, when you need it.  We recommend signing up for the patient portal called MyChart.  Sign up information is provided on this After Visit Summary.  MyChart is used to connect with patients for Virtual Visits (Telemedicine).  Patients are able to view lab/test results, encounter notes, upcoming appointments, etc.  Non-urgent messages can be sent to your provider as well.   To learn more about what you can do with MyChart, go to [email or web address].    Your next appointment:   October 30th  The format for your next appointment:   In Person  Provider:   Lamar Fitch, MD    Other Instructions NA

## 2024-01-04 ENCOUNTER — Other Ambulatory Visit (HOSPITAL_COMMUNITY): Payer: Self-pay

## 2024-01-06 ENCOUNTER — Ambulatory Visit: Admitting: Cardiology

## 2024-01-17 ENCOUNTER — Telehealth: Payer: Self-pay | Admitting: Student

## 2024-01-17 NOTE — Telephone Encounter (Signed)
 Copied from CRM [id number]. Topic: General - Other >> Jan 17, 2024  2:38 PM Victoria A wrote: Reason for CRM: Patient called said there is paper work that was dropped off FMLA that needs to be completed by 01/23/24-patient would like a call regarding paperwork [phone] (M)

## 2024-01-18 NOTE — Telephone Encounter (Signed)
 LMOM notifying pt that we do not have any FMLA forms for her and that Harlene would like for her to make an appointment when she gets another set of forms.

## 2024-01-22 ENCOUNTER — Telehealth: Payer: Self-pay | Admitting: Student

## 2024-01-24 ENCOUNTER — Telehealth: Payer: Self-pay | Admitting: Cardiology

## 2024-01-24 ENCOUNTER — Telehealth: Payer: Self-pay | Admitting: *Deleted

## 2024-01-24 NOTE — Telephone Encounter (Signed)
 Spoke with pt.  FMLA forms are for cardiology related conditions.  Pt was advised to contact their office to get them filled out.  Cancelling appointment tomorrow with Wheeler.

## 2024-01-24 NOTE — Telephone Encounter (Signed)
 Spoke with pt.  Forms are for cardiology.  Appointment cancelled.

## 2024-01-24 NOTE — Telephone Encounter (Signed)
 Copied from CRM #[id number]. Topic: General - Other >> Jan 24, 2024  1:20 PM Mercedes MATSU wrote: Reason for CRM: Patients daughter called in wanting to know if the offfice received a fax of patients fmla forms. She is scheduled for tomorrow and the patients daughter wants to make sure they have the papers. Can be reached and requesting a call back to [phone].

## 2024-01-24 NOTE — Telephone Encounter (Signed)
 LMOM asking pt to reach out to cardiology if fmla forms are regarding a cardiac issue and if so, to cancel appointment tomorrow with Harlene.

## 2024-01-24 NOTE — Telephone Encounter (Signed)
 Left vm to return call.

## 2024-01-24 NOTE — Telephone Encounter (Signed)
  Daughter is calling to see if we received the FMLA paperwork for the patient that Dr Bernie needs to fill out

## 2024-01-25 ENCOUNTER — Ambulatory Visit: Admitting: Student

## 2024-02-18 ENCOUNTER — Encounter: Payer: Self-pay | Admitting: Pulmonary Disease

## 2024-02-18 ENCOUNTER — Ambulatory Visit

## 2024-02-18 DIAGNOSIS — J4520 Mild intermittent asthma, uncomplicated: Secondary | ICD-10-CM

## 2024-02-18 LAB — PULMONARY FUNCTION TEST
DL/VA % pred: 136 %
DL/VA: 5.66 ml/min/mmHg/L
DLCO unc % pred: 87 %
DLCO unc: 18.83 ml/min/mmHg
FEF 25-75 Post: 3.73 L/s
FEF 25-75 Pre: 3.28 L/s
FEF2575-%Change-Post: 13 %
FEF2575-%Pred-Post: 151 %
FEF2575-%Pred-Pre: 133 %
FEV1-%Change-Post: 3 %
FEV1-%Pred-Post: 69 %
FEV1-%Pred-Pre: 66 %
FEV1-Post: 1.9 L
FEV1-Pre: 1.84 L
FEV1FVC-%Change-Post: 0 %
FEV1FVC-%Pred-Pre: 111 %
FEV6-%Change-Post: 2 %
FEV6-%Pred-Post: 63 %
FEV6-%Pred-Pre: 61 %
FEV6-Post: 2.17 L
FEV6-Pre: 2.11 L
FEV6FVC-%Pred-Post: 103 %
FEV6FVC-%Pred-Pre: 103 %
FVC-%Change-Post: 2 %
FVC-%Pred-Post: 60 %
FVC-%Pred-Pre: 59 %
FVC-Post: 2.17 L
FVC-Pre: 2.11 L
Post FEV1/FVC ratio: 88 %
Post FEV6/FVC ratio: 100 %
Pre FEV1/FVC ratio: 87 %
Pre FEV6/FVC Ratio: 100 %
RV % pred: 55 %
RV: 1.15 L
TLC % pred: 62 %
TLC: 3.36 L

## 2024-02-18 NOTE — Patient Instructions (Signed)
 Full pft performed today

## 2024-02-18 NOTE — Progress Notes (Signed)
 Full pft performed today

## 2024-02-19 ENCOUNTER — Other Ambulatory Visit: Payer: Self-pay | Admitting: Student

## 2024-02-19 DIAGNOSIS — J302 Other seasonal allergic rhinitis: Secondary | ICD-10-CM

## 2024-02-21 ENCOUNTER — Ambulatory Visit: Admitting: Pulmonary Disease

## 2024-02-29 ENCOUNTER — Other Ambulatory Visit: Payer: Self-pay

## 2024-03-01 ENCOUNTER — Emergency Department (HOSPITAL_COMMUNITY): Admission: EM | Admit: 2024-03-01 | Discharge: 2024-03-01 | Disposition: A | Discharge status: Home / Self Care

## 2024-03-01 ENCOUNTER — Other Ambulatory Visit: Payer: Self-pay

## 2024-03-01 ENCOUNTER — Emergency Department (HOSPITAL_COMMUNITY)

## 2024-03-01 ENCOUNTER — Encounter (HOSPITAL_COMMUNITY): Payer: Self-pay | Admitting: Emergency Medicine

## 2024-03-01 DIAGNOSIS — I509 Heart failure, unspecified: Secondary | ICD-10-CM | POA: Insufficient documentation

## 2024-03-01 DIAGNOSIS — I110 Hypertensive heart disease with heart failure: Secondary | ICD-10-CM | POA: Insufficient documentation

## 2024-03-01 DIAGNOSIS — R0602 Shortness of breath: Secondary | ICD-10-CM | POA: Diagnosis present

## 2024-03-01 DIAGNOSIS — I428 Other cardiomyopathies: Secondary | ICD-10-CM | POA: Insufficient documentation

## 2024-03-01 DIAGNOSIS — R600 Localized edema: Secondary | ICD-10-CM | POA: Diagnosis not present

## 2024-03-01 DIAGNOSIS — E119 Type 2 diabetes mellitus without complications: Secondary | ICD-10-CM | POA: Diagnosis not present

## 2024-03-01 DIAGNOSIS — Z794 Long term (current) use of insulin: Secondary | ICD-10-CM | POA: Diagnosis not present

## 2024-03-01 LAB — BASIC METABOLIC PANEL WITH GFR
Anion gap: 11 (ref 5–15)
BUN: 24 mg/dL — ABNORMAL HIGH (ref 6–20)
CO2: 26 mmol/L (ref 22–32)
Calcium: 8.8 mg/dL — ABNORMAL LOW (ref 8.9–10.3)
Chloride: 101 mmol/L (ref 98–111)
Creatinine, Ser: 0.93 mg/dL (ref 0.44–1.00)
GFR, Estimated: >60 mL/min
Glucose, Bld: 234 mg/dL — ABNORMAL HIGH (ref 70–99)
Potassium: 3.8 mmol/L (ref 3.5–5.1)
Sodium: 137 mmol/L (ref 135–145)

## 2024-03-01 LAB — CBC
HCT: 39.2 % (ref 36.0–46.0)
Hemoglobin: 12.6 g/dL (ref 12.0–15.0)
MCH: 27.1 pg (ref 26.0–34.0)
MCHC: 32.1 g/dL (ref 30.0–36.0)
MCV: 84.3 fL (ref 80.0–100.0)
Platelets: 395 K/uL (ref 150–400)
RBC: 4.65 MIL/uL (ref 3.87–5.11)
RDW: 16.4 % — ABNORMAL HIGH (ref 11.5–15.5)
WBC: 10.1 K/uL (ref 4.0–10.5)
nRBC: 0 % (ref 0.0–0.2)

## 2024-03-01 LAB — PRO BRAIN NATRIURETIC PEPTIDE: Pro Brain Natriuretic Peptide: 5626 pg/mL — ABNORMAL HIGH

## 2024-03-01 MED ORDER — FUROSEMIDE 10 MG/ML IJ SOLN: 80.0000 mg | Freq: Once | INTRAMUSCULAR | Status: DC

## 2024-03-01 MED ORDER — FUROSEMIDE 40 MG PO TABS: 80.0000 mg | ORAL_TABLET | Freq: Two times a day (BID) | ORAL | 0 refills | Status: DC

## 2024-03-01 MED ORDER — FUROSEMIDE 40 MG PO TABS
80.0000 mg | ORAL_TABLET | Freq: Once | ORAL | Status: AC
  Administered 2024-03-01: 80 mg via ORAL
  Filled 2024-03-01: qty 2

## 2024-03-01 NOTE — ED Provider Notes (Signed)
 " [place] EMERGENCY DEPARTMENT AT [facility] Provider Note   CSN: [id number] Arrival date & time: 03/01/24  9451     Patient presents with: Shortness of Breath and Foot Swelling   Natasha Hudson is a 58 y.o. female.   59 year old female with past medical history of CHF, diabetes, and hypertension presenting to the emergency department today with shortness of breath and leg swelling.  The patient states that the leg swelling has been going on now for the past few weeks.  She has increased her Lasix  to 80 mg today.  Has been taking this as prescribed.  She states that the leg swelling has worsened.  She reports that she started having shortness of breath as well as some orthopnea.  This been going on now over the past few days.  She reports minimal cough with this.  She denies any hemoptysis.  Denies any chest pain.  She came to the ER today for further evaluation due to worsening symptoms.   Shortness of Breath      Prior to Admission medications  Medication Sig Start Date End Date Taking? Authorizing Provider  furosemide  (LASIX ) 40 MG tablet Take 2 tablets (80 mg total) by mouth 2 (two) times daily. 03/01/24  Yes Ula Prentice SAUNDERS, MD  ACCU-CHEK GUIDE TEST test strip USE TO CHECK BLOOD SUGAR THREE TIMES DAILY 11/01/23   [facility] Harlene CROME, NP  albuterol  (VENTOLIN  HFA) 108 (90 Base) MCG/ACT inhaler INHALE 1-2 PUFFS INTO THE LUNGS EVERY 4 HOURS AS NEEDED FOR WHEEZING OR SHORTNESS OF BREATH. 12/27/23   [facility] Harlene CROME, NP  BAYER LOW DOSE 81 MG tablet Take 81 mg by mouth in the morning. Swallow whole.    [provider]  Blood Glucose Monitoring Suppl (BLOOD GLUCOSE MONITOR SYSTEM) w/Device KIT Use to check blood sugar as directed 07/26/23   Rojelio Nest, DO  cetirizine  (ZYRTEC ) 10 MG tablet TAKE 1 TABLET BY MOUTH EVERY DAY 02/21/24   Yacopino, Jessica L, NP  empagliflozin  (JARDIANCE ) 10 MG TABS tablet Take 1 tablet (10 mg total) by mouth daily before breakfast.  11/25/23   [facility] Harlene CROME, NP  fluticasone  (FLONASE ) 50 MCG/ACT nasal spray Place 2 sprays into both nostrils daily. 11/25/23   [facility]eler Harlene CROME, NP  insulin  aspart (NOVOLOG ) 100 UNIT/ML FlexPen Inject 0-6 Units into the skin 3 (three) times daily with meals. Check Blood Glucose (BG) and inject per scale: BG <150= 0 unit; BG 150-200= 1 unit; BG 201-250= 2 unit; BG 251-300= 3 unit; BG 301-350= 4 unit; BG 351-400= 5 unit; BG >400= 6 unit and Call Primary Care. 09/03/23   [facility] Harlene CROME, NP  Insulin  Pen Needle (PEN NEEDLES) 31G X 5 MM MISC Use to inject insulin  3 times daily 07/26/23   Rojelio Nest, DO  Lancet Device MISC 1 each by Does not apply route 3 (three) times daily. May dispense any manufacturer covered by patient's insurance. 07/26/23   Rojelio Nest, DO  Lancets MISC Use to check blood sugar three times daily 07/26/23   Rojelio Nest, DO  LANTUS  SOLOSTAR 100 UNIT/ML Solostar Pen Inject 15 Units into the skin daily. 09/03/23   [provider]  omeprazole  (PRILOSEC) 20 MG capsule Take 2 capsules (40 mg total) by mouth daily. 09/08/23   Sebastian Toribio GAILS, MD  potassium chloride  SA (KLOR-CON  M) 20 MEQ tablet Take 1 tablet (20 mEq total) by mouth daily. 12/29/23   Krasowski, Robert J, MD  rosuvastatin  (CRESTOR ) 20 MG tablet Take 1 tablet (  20 mg total) by mouth daily. 12/16/23   Krasowski, Robert J, MD  sacubitril -valsartan  (ENTRESTO ) 24-26 MG Take 1 tablet by mouth 2 (two) times daily. 11/10/23   Krasowski, Robert J, MD  spironolactone  (ALDACTONE ) 25 MG tablet Take 0.5 tablets (12.5 mg total) by mouth every morning. 12/16/23   Krasowski, Robert J, MD    Allergies: Metformin    Review of Systems  Respiratory:  Positive for shortness of breath.   Cardiovascular:  Positive for leg swelling.  All other systems reviewed and are negative.   Updated Vital Signs BP (!) 141/101   Pulse 98   Temp 98.1 F (36.7 C) (Oral)   Resp (!) 22   Ht 5' 5 (1.651 m)   Wt 70 kg   LMP  10/03/2012   SpO2 96%   BMI 25.68 kg/m   Physical Exam Vitals and nursing note reviewed.   Gen: NAD, mild conversational dyspnea noted Eyes: PERRL, EOMI HEENT: no oropharyngeal swelling Neck: trachea midline Resp: Diminished at bilateral lung bases Card: RRR, no murmurs, rubs, or gallops Abd: nontender, nondistended Extremities: no calf tenderness, 2+ pitting edema bilaterally Vascular: 2+ radial pulses bilaterally, 2+ DP pulses bilaterally Skin: no rashes Psyc: acting appropriately   (all labs ordered are listed, but only abnormal results are displayed) Labs Reviewed  BASIC METABOLIC PANEL WITH GFR - Abnormal; Notable for the following components:      Result Value   Glucose, Bld 234 (*)    BUN 24 (*)    Calcium  8.8 (*)    All other components within normal limits  CBC - Abnormal; Notable for the following components:   RDW 16.4 (*)    All other components within normal limits  PRO BRAIN NATRIURETIC PEPTIDE - Abnormal; Notable for the following components:   Pro Brain Natriuretic Peptide 5,626.0 (*)    All other components within normal limits    EKG: EKG Interpretation Date/Time:  Wednesday March 01 2024 06:00:10 EST Ventricular Rate:  100 PR Interval:  149 QRS Duration:  128 QT Interval:  388 QTC Calculation: 501 R Axis:   107  Text Interpretation: Sinus tachycardia Biatrial enlargement Nonspecific intraventricular conduction delay Abnormal lateral Q waves Minimal ST depression, lateral leads Borderline ST elevation, anterior leads Confirmed by Griselda Norris [id number]) on 03/01/2024 6:25:57 AM  Radiology: DG Chest 2 View Result Date: 03/01/2024 EXAM: 2 VIEW(S) XRAY OF THE CHEST 03/01/2024 06:23:00 AM COMPARISON: PA lateral chest 10/22/2023, high resolution chest CT 01/01/2024. CLINICAL HISTORY: shob shob FINDINGS: LINES, TUBES AND DEVICES: Multiple overlying telemetry leads. LUNGS AND PLEURA: No focal pulmonary opacity. No pleural effusion. No pneumothorax. No  vascular congestion is seen. HEART AND MEDIASTINUM: Stable mild cardiomegaly. The mediastinum is normally outlined. BONES AND SOFT TISSUES: Thoracic spondylosis, mild kyphosis, and slight dextroscoliosis. IMPRESSION: 1. No acute cardiopulmonary findings. 2. Stable mild cardiomegaly. Electronically signed by: Francis Quam MD 03/01/2024 06:32 AM EST RP Workstation: [id number]     Procedures   Medications Ordered in the ED  furosemide  (LASIX ) tablet 80 mg (80 mg Oral Given 03/01/24 0931)                                    Medical Decision Making 58 year old female with past medical history of CHF, diabetes, and hypertension presenting to the emergency department today with shortness of breath and lower extremity swelling consistent with likely CHF exacerbation.  I will further evaluate the patient here  with basic labs to evaluate for anemia as well as a BNP and chest x-ray.  She is denying any chest pain and is not tachycardic here.  Suspicion for pulmonary embolism is relatively low at this time.  I will reevaluate for ultimate disposition.  The patient's chest x-ray does show some cardiomegaly.  BNP is elevated.  The patient is given 80 mg of Lasix  here.  Will obtain an ambulatory pulse ox here to see if the patient does desaturate.  The patient's oxygen saturations remained in the high 90s with ambulatory pulse ox.  I did call and discussed this with Dr. Wadie from cardiology.  Recommends going up to 80 mg twice a day for 1 week and then go to 80 mg in the morning and 40 mg in the evening thereafter.  I did discuss this with the patient.  She is amenable to this plan.  I have placed a cardiology referral to get her into the clinic and she is encouraged to follow-up with them or primary care doctor to have her BMP rechecked in 2 weeks.  She is discharged with return precautions.  Amount and/or Complexity of Data Reviewed Labs: ordered. Radiology: ordered.  Risk Prescription drug  management.        Final diagnoses:  Peripheral edema  Nonischemic cardiomyopathy [facility])    ED Discharge Orders          Ordered    Ambulatory referral to Cardiology       Comments: If you have not heard from the Cardiology office within the next 72 hours please call [phone].   03/01/24 1000    furosemide  (LASIX ) 40 MG tablet  2 times daily        03/01/24 1039               Ula Prentice SAUNDERS, MD 03/01/24 1042  "

## 2024-03-01 NOTE — ED Notes (Signed)
 Patient ambulated down the hall and back to room 12 and oxygen stayed at 100% the whole time with a pulse of 107.

## 2024-03-01 NOTE — Progress Notes (Signed)
 Heart Failure Navigator Progress Note  Assessed for Heart & Vascular TOC clinic readiness.  Patient does not meet criteria due to patient was discharged from the ED, has a [facility] scheduled appointment on 03/29/2024. .   Navigator available for reassessment of patient.   Stephane Haddock, BSN, Scientist, Clinical (histocompatibility And Immunogenetics) Only

## 2024-03-01 NOTE — ED Notes (Signed)
 Provided pt with oj and biscuit.  No other needs expressed at this time.

## 2024-03-01 NOTE — ED Triage Notes (Signed)
 Pt reports SHOB and swelling in bilateral feet x 1 week. Reports she takes lasix  and they upped her dose to 80mg  3 weeks ago. Reports SHOB and cough worse when she lays down.

## 2024-03-01 NOTE — Discharge Instructions (Addendum)
 Your workup today was reassuring overall.  I did call and discuss your case with our cardiologist on-call.  They would like you to go up to 80 mg twice daily on your Lasix  for the next 7 days.  You may then go to 80 mg in the morning and 40 mg in the evening.  I have placed a consult to get you in to see them in the next few weeks.  Please call your primary care doctor as well.  With the increase in the medication dosage they do recommend that you have repeat blood work in 2 weeks to evaluate your kidney function.  Please return to the ER for worsening symptoms.

## 2024-03-09 ENCOUNTER — Other Ambulatory Visit: Payer: Self-pay | Admitting: Student

## 2024-03-09 DIAGNOSIS — E1165 Type 2 diabetes mellitus with hyperglycemia: Secondary | ICD-10-CM

## 2024-03-12 ENCOUNTER — Other Ambulatory Visit (HOSPITAL_COMMUNITY): Payer: Self-pay

## 2024-03-14 DIAGNOSIS — K219 Gastro-esophageal reflux disease without esophagitis: Secondary | ICD-10-CM

## 2024-03-16 ENCOUNTER — Encounter: Payer: Self-pay | Admitting: Student

## 2024-03-16 ENCOUNTER — Ambulatory Visit: Admitting: Student

## 2024-03-16 VITALS — BP 138/88 | HR 96 | Temp 98.2°F | Ht 65.0 in | Wt 156.0 lb

## 2024-03-16 DIAGNOSIS — I110 Hypertensive heart disease with heart failure: Secondary | ICD-10-CM | POA: Diagnosis not present

## 2024-03-16 DIAGNOSIS — Z0000 Encounter for general adult medical examination without abnormal findings: Secondary | ICD-10-CM

## 2024-03-16 DIAGNOSIS — Z23 Encounter for immunization: Secondary | ICD-10-CM

## 2024-03-16 DIAGNOSIS — E1165 Type 2 diabetes mellitus with hyperglycemia: Secondary | ICD-10-CM | POA: Diagnosis not present

## 2024-03-16 DIAGNOSIS — I509 Heart failure, unspecified: Secondary | ICD-10-CM | POA: Diagnosis not present

## 2024-03-16 DIAGNOSIS — R0602 Shortness of breath: Secondary | ICD-10-CM | POA: Diagnosis not present

## 2024-03-16 DIAGNOSIS — R600 Localized edema: Secondary | ICD-10-CM | POA: Insufficient documentation

## 2024-03-16 DIAGNOSIS — I10 Essential (primary) hypertension: Secondary | ICD-10-CM | POA: Diagnosis not present

## 2024-03-16 DIAGNOSIS — Z09 Encounter for follow-up examination after completed treatment for conditions other than malignant neoplasm: Secondary | ICD-10-CM

## 2024-03-16 DIAGNOSIS — E785 Hyperlipidemia, unspecified: Secondary | ICD-10-CM

## 2024-03-16 DIAGNOSIS — L989 Disorder of the skin and subcutaneous tissue, unspecified: Secondary | ICD-10-CM

## 2024-03-16 MED ORDER — POTASSIUM CHLORIDE CRYS ER 20 MEQ PO TBCR: 20.0000 meq | EXTENDED_RELEASE_TABLET | Freq: Every day | ORAL | 0 refills | Status: DC

## 2024-03-16 MED ORDER — FREESTYLE LIBRE 3 PLUS SENSOR MISC: 3 refills | Status: AC

## 2024-03-16 MED ORDER — MUPIROCIN 2 % EX OINT: 1.0000 | TOPICAL_OINTMENT | Freq: Two times a day (BID) | CUTANEOUS | 1 refills | Status: AC

## 2024-03-16 MED ORDER — EMPAGLIFLOZIN 25 MG PO TABS: 25.0000 mg | ORAL_TABLET | Freq: Every day | ORAL | 0 refills | Status: AC

## 2024-03-16 MED ORDER — FUROSEMIDE 40 MG PO TABS: 40.0000 mg | ORAL_TABLET | Freq: Every day | ORAL | 0 refills | Status: DC

## 2024-03-16 NOTE — Assessment & Plan Note (Addendum)
-   Continue Lasix  40 mg in the morning. - Add Lasix  40 mg in the afternoon for 3 days. - Take potassium 20 mg with morning Lasix  and 20 mg with afternoon Lasix . -RTC in 4 days repeat BMP

## 2024-03-16 NOTE — Assessment & Plan Note (Signed)
" °-   Apply mupirocin  ointment to affected area. - Monitor for signs of infection or worsening condition. "

## 2024-03-16 NOTE — Assessment & Plan Note (Signed)
 Continue FU with cardiology. Well controlled, no changes to meds. Encouraged heart healthy diet such as the DASH diet and exercise as tolerated.

## 2024-03-16 NOTE — Progress Notes (Unsigned)
 "  Acute Office Visit  Subjective:     Patient ID: Natasha Hudson, female    DOB: Dec 27, 1965, 59 y.o.   MRN: [id number]  Chief Complaint  Patient presents with   Follow-up    ED follow up - swelling of both legs - knees & below   Medication Refill   Cough    Onset - since May 2025 - consistent cough - coughing more at night       HPI Patient is in today for hospital FU and FU chronic conditions.  She has had persistent leg swelling for the past 2 to 3 weeks  She uses Lasix  for fluid management. After a hospital visit on December 24th she took 80 mg twice daily for 7 days from her cardiologist, then returned to 40 mg daily. She takes potassium 20 mg daily with Lasix . Despite this regimine, still has significant peripheral edema.  Her blood sugars have been elevated over the same 2 to 3 week period as the fluid retention.  recently had a reading of 265 mg/dL. She uses Jardiance  and about 20 units of Lantus  daily.Metformin was stopped previously due to gastrointestinal side effects.   She denies shortness of breath and has been sleeping well. She reports significant leg swelling without recent falls or injuries.  Recent hospitalization Location: [facility]: SOB, leg swelling Diagnosis: Peripheral Edema Given 80 mg Lasix  in hospital, discharged in 4 hours  Wt Readings from Last 3 Encounters:  03/16/24 156 lb (70.8 kg)  03/01/24 154 lb 5.2 oz (70 kg)  12/29/23 156 lb (70.8 kg)     Diabetes: - Checking glucose at home: yes -Home BS- 130s-170s - Compliant with medications - Denies symptoms of hypoglycemia, polyuria, polydipsia, numbness extremities, foot ulcers/trauma, visual changes, wounds that are not healing, medication side effects   Patient denies fever, chills, SOB, CP, palpitations, dyspnea,  HA, vision changes, N/V/D, abdominal pain, urinary symptoms, rash.   ROS  See HPI    Objective:    BP 138/88 (BP Location: Left Arm, Patient Position: Sitting, Cuff  Size: Normal)   Pulse 96   Temp 98.2 F (36.8 C) (Oral)   Ht 5' 5 (1.651 m)   Wt 156 lb (70.8 kg)   LMP 10/03/2012   SpO2 99%   BMI 25.96 kg/m  {Vitals History (Optional):23777}  Physical Exam General:  Well developed, well nourished, in no apparent distress Skin:  Warm, no pallor or diaphoresis Right anterior calf: small shallow wound, no drainage Head:  Normocephalic, atraumatic Eyes:  Pupils equal and round, sclera anicteric without injection  Lungs:  CTAB, no access muscle use Cardio:  RRR, no bruits, no B/L LE edema +3 Musculoskeletal:  Symmetrical muscle groups noted without atrophy or deformity Psych: Age appropriate judgment and insight  No results found for any visits on 03/16/24.      Assessment & Plan:   Problem List Items Addressed This Visit       Cardiovascular and Mediastinum   CHF (congestive heart failure) (HCC)   Multiple ED admissions for CHF exacerbation. Ensure taking medications as prescribed and close follow up with cardiology. Current regimine may be insufficient, advise discussion with cardiology.  Patient was educated on recognizing early signs of CHF exacerbation, including increased SOB, swelling in the legs or feet, fatigue, and sudden weight gain. Emphasized the importance of daily weight monitoring at the same time each morning after voiding and before eating. RTC for weight gain of more than 2-3 pounds in a day  or 5 pounds in a week, as this may indicate fluid retention. Patient voiced understanding. Follow-up with cardiology.      Relevant Medications   furosemide  (LASIX ) 40 MG tablet   potassium chloride  SA (KLOR-CON  M) 20 MEQ tablet   Other Relevant Orders   B Nat Peptide   Basic Metabolic Panel (BMET)   Essential hypertension   Continue FU with cardiology. Well controlled, no changes to meds. Encouraged heart healthy diet such as the DASH diet and exercise as tolerated.        Relevant Medications   furosemide  (LASIX ) 40 MG  tablet   Other Relevant Orders   B Nat Peptide     Endocrine   Type 2 diabetes mellitus with hyperglycemia (HCC)   - Rx- Increased Jardiance  to 25 mg daily. - Ordered A1c test today. -Ordered and educated on [employer], continuous glucose monitoring with [employer]. - Referred to pharmacist for DM counseling, Freestyle Libre setup. - Follow up in 3 months for diabetes management.       Relevant Medications   Continuous Glucose Sensor (FREESTYLE LIBRE 3 PLUS SENSOR) MISC   empagliflozin  (JARDIANCE ) 25 MG TABS tablet   Other Relevant Orders   HgB A1c   Comprehensive metabolic panel with GFR   AMB Referral VBCI Care Management     Musculoskeletal and Integument   Leg skin lesion, right    - Apply mupirocin  ointment to affected area. - Monitor for signs of infection or worsening condition.      Relevant Medications   mupirocin  ointment (BACTROBAN ) 2 %     Other   Hospital discharge follow-up - Primary   Hyperlipidemia   Relevant Medications   furosemide  (LASIX ) 40 MG tablet   Other Relevant Orders   Lipid panel   Peripheral edema   - Continue Lasix  40 mg in the morning. - Add Lasix  40 mg in the afternoon for 3 days. - Take potassium 20 mg with morning Lasix  and 20 mg with afternoon Lasix . -RTC in 4 days repeat BMP      SOB (shortness of breath)   Other Visit Diagnoses       Preventative health care       Relevant Orders   Hepatitis C Antibody     Need for pneumococcal 20-valent conjugate vaccination       Relevant Orders   Pneumococcal conjugate vaccine 20-valent (Prevnar 20)       Meds ordered this encounter  Medications   Continuous Glucose Sensor (FREESTYLE LIBRE 3 PLUS SENSOR) MISC    Sig: Change sensor every 15 days.    Dispense:  6 each    Refill:  3    Supervising Provider:   DOMENICA BLACKBIRD A [[id number]]   furosemide  (LASIX ) 40 MG tablet    Sig: Take 1 tablet (40 mg total) by mouth daily. Take for 3    Dispense:  3 tablet    Refill:  0     Supervising Provider:   DOMENICA BLACKBIRD A [[id number]]   potassium chloride  SA (KLOR-CON  M) 20 MEQ tablet    Sig: Take 1 tablet (20 mEq total) by mouth daily.    Dispense:  3 tablet    Refill:  0    Supervising Provider:   DOMENICA BLACKBIRD A [[id number]]   empagliflozin  (JARDIANCE ) 25 MG TABS tablet    Sig: Take 1 tablet (25 mg total) by mouth daily before breakfast.    Dispense:  90 tablet    Refill:  0  Supervising Provider:   DOMENICA BLACKBIRD A [[id number]]   mupirocin  ointment (BACTROBAN ) 2 %    Sig: Apply 1 Application topically 2 (two) times daily.    Dispense:  22 g    Refill:  1    Supervising Provider:   DOMENICA BLACKBIRD A [[id number]]    Return in about 3 months (around 06/14/2024), or follow up.  Harlene LITTIE Jolly, NP   "

## 2024-03-16 NOTE — Assessment & Plan Note (Addendum)
 Multiple ED admissions for CHF exacerbation. Ensure taking medications as prescribed and close follow up with cardiology. Current regimine may be insufficient, advise discussion with cardiology.  Patient was educated on recognizing early signs of CHF exacerbation, including increased SOB, swelling in the legs or feet, fatigue, and sudden weight gain. Emphasized the importance of daily weight monitoring at the same time each morning after voiding and before eating. RTC for weight gain of more than 2-3 pounds in a day or 5 pounds in a week, as this may indicate fluid retention. Patient voiced understanding. Follow-up with cardiology.

## 2024-03-16 NOTE — Assessment & Plan Note (Addendum)
-   Rx- Increased Jardiance  to 25 mg daily. - Ordered A1c test today. -Ordered and educated on [employer], continuous glucose monitoring with [employer]. - Referred to pharmacist for DM counseling, Freestyle Libre setup. - Follow up in 3 months for diabetes management.

## 2024-03-17 ENCOUNTER — Encounter: Payer: Self-pay | Admitting: Student

## 2024-03-17 ENCOUNTER — Ambulatory Visit: Payer: Self-pay | Admitting: Student

## 2024-03-17 DIAGNOSIS — R7309 Other abnormal glucose: Secondary | ICD-10-CM | POA: Insufficient documentation

## 2024-03-17 DIAGNOSIS — R7989 Other specified abnormal findings of blood chemistry: Secondary | ICD-10-CM

## 2024-03-17 LAB — LIPID PANEL
Cholesterol: 139 mg/dL (ref 28–200)
HDL: 38.3 mg/dL — ABNORMAL LOW
LDL Cholesterol: 86 mg/dL (ref 10–99)
NonHDL: 100.47
Total CHOL/HDL Ratio: 4
Triglycerides: 70 mg/dL (ref 10.0–149.0)
VLDL: 14 mg/dL (ref 0.0–40.0)

## 2024-03-17 LAB — COMPREHENSIVE METABOLIC PANEL WITH GFR
ALT: 40 U/L — ABNORMAL HIGH (ref 3–35)
AST: 36 U/L (ref 5–37)
Albumin: 3.6 g/dL (ref 3.5–5.2)
Alkaline Phosphatase: 103 U/L (ref 39–117)
BUN: 17 mg/dL (ref 6–23)
CO2: 32 meq/L (ref 19–32)
Calcium: 9.5 mg/dL (ref 8.4–10.5)
Chloride: 100 meq/L (ref 96–112)
Creatinine, Ser: 0.91 mg/dL (ref 0.40–1.20)
GFR: 68.34 mL/min
Glucose, Bld: 155 mg/dL — ABNORMAL HIGH (ref 70–99)
Potassium: 3.5 meq/L (ref 3.5–5.1)
Sodium: 140 meq/L (ref 135–145)
Total Bilirubin: 0.9 mg/dL (ref 0.2–1.2)
Total Protein: 7.3 g/dL (ref 6.0–8.3)

## 2024-03-17 LAB — BRAIN NATRIURETIC PEPTIDE: Pro B Natriuretic peptide (BNP): 1666 pg/mL — ABNORMAL HIGH (ref 1.0–100.0)

## 2024-03-17 LAB — HEMOGLOBIN A1C: Hgb A1c MFr Bld: 12.1 % — ABNORMAL HIGH (ref 4.6–6.5)

## 2024-03-21 ENCOUNTER — Telehealth: Payer: Self-pay

## 2024-03-21 NOTE — Progress Notes (Signed)
 Complex Care Management Note Care Guide Note  03/21/2024 Name: Natasha Hudson MRN: [id number] DOB: 09-Jun-1965   Complex Care Management Outreach Attempts: An unsuccessful telephone outreach was attempted today to offer the patient information about available complex care management services.  Follow Up Plan:  Additional outreach attempts will be made to offer the patient complex care management information and services.   Encounter Outcome:  No Answer  Dreama [facility]  [facility], [facility] VBCI Assistant Direct Dial: [phone]  Fax: [phone]

## 2024-03-23 NOTE — Progress Notes (Signed)
 Complex Care Management Note Care Guide Note  03/23/2024 Name: Natasha Hudson MRN: [id number] DOB: 05/23/1965   Complex Care Management Outreach Attempts: A second unsuccessful outreach was attempted today to offer the patient with information about available complex care management services.  Follow Up Plan:  Additional outreach attempts will be made to offer the patient complex care management information and services.   Encounter Outcome:  No Answer  Dreama [facility]  [facility], [facility] VBCI Assistant Direct Dial: [phone]  Fax: [phone]

## 2024-03-27 NOTE — Progress Notes (Signed)
 Complex Care Management Note Care Guide Note  03/27/2024 Name: Natasha Hudson MRN: [id number] DOB: [birth year]   Complex Care Management Outreach Attempts: A third unsuccessful outreach was attempted today to offer the patient with information about available complex care management services.  Follow Up Plan:  No further outreach attempts will be made at this time. We have been unable to contact the patient to offer or enroll patient in complex care management services.  Encounter Outcome:  No Answer  Dreama [facility]  [facility], [facility] VBCI Assistant Direct Dial: [phone]  Fax: [phone]

## 2024-03-28 ENCOUNTER — Ambulatory Visit: Attending: Cardiology | Admitting: Cardiology

## 2024-03-28 ENCOUNTER — Inpatient Hospital Stay: Admitting: Student

## 2024-03-28 ENCOUNTER — Encounter: Payer: Self-pay | Admitting: Cardiology

## 2024-03-28 VITALS — BP 124/88 | HR 105 | Ht 65.0 in | Wt 161.0 lb

## 2024-03-28 DIAGNOSIS — R0609 Other forms of dyspnea: Secondary | ICD-10-CM

## 2024-03-28 DIAGNOSIS — I509 Heart failure, unspecified: Secondary | ICD-10-CM

## 2024-03-28 DIAGNOSIS — E1165 Type 2 diabetes mellitus with hyperglycemia: Secondary | ICD-10-CM | POA: Diagnosis not present

## 2024-03-28 DIAGNOSIS — I420 Dilated cardiomyopathy: Secondary | ICD-10-CM | POA: Diagnosis not present

## 2024-03-28 DIAGNOSIS — I447 Left bundle-branch block, unspecified: Secondary | ICD-10-CM | POA: Diagnosis not present

## 2024-03-28 DIAGNOSIS — I5042 Chronic combined systolic (congestive) and diastolic (congestive) heart failure: Secondary | ICD-10-CM | POA: Diagnosis not present

## 2024-03-28 DIAGNOSIS — I340 Nonrheumatic mitral (valve) insufficiency: Secondary | ICD-10-CM | POA: Diagnosis not present

## 2024-03-28 MED ORDER — FUROSEMIDE 40 MG PO TABS: 80.0000 mg | ORAL_TABLET | Freq: Every day | ORAL | 3 refills | Status: AC

## 2024-03-28 MED ORDER — POTASSIUM CHLORIDE CRYS ER 20 MEQ PO TBCR: 30.0000 meq | EXTENDED_RELEASE_TABLET | Freq: Every day | ORAL | 3 refills | Status: AC

## 2024-03-28 NOTE — Patient Instructions (Addendum)
 Medication Instructions:   INCREASE: Lasix  to 80mg  daily- 2 40mg  tabs  INCREASE: Potassium to 30mg  daily 1 1/2 tabs of 20mg  tablet   Lab Work: [employer] [facility] [facility] today  3rd Floor   [address]  Your physician recommends that you return for lab work in:   1 week You need to have labs done when you are fasting.  You can come Monday through Friday 8:00 am to 11:30AM and 1:00 to 4:00. You do not need to make an appointment as the order has already been placed.   Testing/Procedures: Your physician has requested that you have an echocardiogram. Echocardiography is a painless test that uses sound waves to create images of your heart. It provides your doctor with information about the size and shape of your heart and how well your hearts chambers and valves are working. This procedure takes approximately one hour. There are no restrictions for this procedure. Please do NOT wear cologne, perfume, aftershave, or lotions (deodorant is allowed). Please arrive 15 minutes prior to your appointment time.  Please note: We ask at that you not bring children with you during ultrasound (echo/ vascular) testing. Due to room size and safety concerns, children are not allowed in the ultrasound rooms during exams. Our front office staff cannot provide observation of children in our lobby area while testing is being conducted. An adult accompanying a patient to their appointment will only be allowed in the ultrasound room at the discretion of the ultrasound technician under special circumstances. We apologize for any inconvenience.    Follow-Up: At [facility], you and your health needs are our priority.  As part of our continuing mission to provide you with exceptional heart care, we have created designated Provider Care Teams.  These Care Teams include your primary Cardiologist (physician) and Advanced Practice Providers (APPs -  Physician Assistants and Nurse Practitioners) who all work together to provide you with the care  you need, when you need it.  We recommend signing up for the patient portal called MyChart.  Sign up information is provided on this After Visit Summary.  MyChart is used to connect with patients for Virtual Visits (Telemedicine).  Patients are able to view lab/test results, encounter notes, upcoming appointments, etc.  Non-urgent messages can be sent to your provider as well.   To learn more about what you can do with MyChart, go to [email or web address].    Your next appointment:   2 week(s)  The format for your next appointment:   In Person  Provider:   Lamar Fitch, MD    Other Instructions NA

## 2024-03-28 NOTE — Progress Notes (Unsigned)
 " Cardiology Office Note:    Date:  03/28/2024   ID:  Natasha Hudson, DOB July 01, 1965, MRN [id number]  PCP:  Wheeler Harlene CROME, NP  Cardiologist:  Lamar Fitch, MD    Referring MD: Wheeler Harlene CROME, NP   Chief Complaint  Patient presents with   Follow-up    History of Present Illness:     Natasha Hudson is a 59 y.o. female past medical history significant for nonischemic cardiomyopathy last assessment in May with ejection fraction of 30%, cardiac catheterization done showed no significant coronary disease at that time she was also find to have severe mitral regurgitation.  Gradually trying to put him on guideline directed medical therapy however she still shows some signs of decompensation, additional problem include diabetes, essential hypertension.  She described to have swelling of lower extremities on 24 December she ended up being in the hospital emergency room because of shortness of breath and swelling of the lower extremities, she was given some extra Lasix  with some improvement now she takes only 40 mg of Lasix .  Does have swelling of lower extremities and some paroxysmal nocturnal dyspnea as well  Past Medical History:  Diagnosis Date   CHF (congestive heart failure) (HCC)    Diabetes mellitus without complication (HCC)    DM type 2 (diabetes mellitus, type 2) (HCC)    Hypertension     Past Surgical History:  Procedure Laterality Date   RIGHT/LEFT HEART CATH AND CORONARY ANGIOGRAPHY N/A 07/23/2023   Procedure: RIGHT/LEFT HEART CATH AND CORONARY ANGIOGRAPHY;  Surgeon: Wonda Sharper, MD;  [facility]: [facility] IN[facility]AS[facility] CV LAB;  Service: Cardiovascular;  Laterality: N/A;   TRANSESOPHAGEAL ECHOCARDIOGRAM (CATH LAB) N/A 07/26/2023   Procedure: TRANSESOPHAGEAL ECHOCARDIOGRAM;  Surgeon: Francyne Headland, MD;  Location: [facility] INVASIVE CV LAB;  Service: Cardiovascular;  Laterality: N/A;    Current Medications: Active Medications[1]   Allergies:   Metformin   Social History    Socioeconomic History   Marital status: Single    Spouse name: Not on file   Number of children: Not on file   Years of education: Not on file   Highest education level: Not on file  Occupational History   Not on file  Tobacco Use   Smoking status: Never   Smokeless tobacco: Never  Vaping Use   Vaping status: Never Used  Substance and Sexual Activity   Alcohol use: No   Drug use: No   Sexual activity: Not on file  Other Topics Concern   Not on file  Social History Narrative   Not on file   Social Drivers of Health   Tobacco Use: Low Risk (03/28/2024)   Patient History    Smoking Tobacco Use: Never    Smokeless Tobacco Use: Never    Passive Exposure: Not on file  Financial Resource Strain: Not on file  Food Insecurity: No Food Insecurity (09/14/2023)   Epic    Worried About Programme Researcher, Broadcasting/film/video in the Last Year: Never true    Ran Out of Food in the Last Year: Never true  Transportation Needs: No Transportation Needs (09/14/2023)   Epic    Lack of Transportation (Medical): No    Lack of Transportation (Non-Medical): No  Physical Activity: Not on file  Stress: Not on file  Social Connections: Unknown (07/19/2023)   Social Connection and Isolation Panel    Frequency of Communication with Friends and Family: Three times a week    Frequency of Social Gatherings with Friends and Family: Three times a week  Attends Religious Services: 1 to 4 times per year    Active Member of Clubs or Organizations: No    Attends Banker Meetings: Never    Marital Status: Patient declined  Depression (PHQ2-9): Low Risk (11/25/2023)   Depression (PHQ2-9)    PHQ-2 Score: 0  Alcohol Screen: Not on file  Housing: Unknown (09/14/2023)   Epic    Unable to Pay for Housing in the Last Year: No    Number of Times Moved in the Last Year: Not on file    Homeless in the Last Year: No  Utilities: Not At Risk (09/14/2023)   Epic    Threatened with loss of utilities: No  Health Literacy:  Not on file     Family History: The patient's family history includes Diabetes in her brother, mother, sister, and another family member; Hypertension in her mother and another family member. ROS:   Please see the history of present illness.    All 14 point review of systems negative except as described per history of present illness  EKGs/Labs/Other Studies Reviewed:         Recent Labs: 09/08/2023: Magnesium 1.8 10/22/2023: B Natriuretic Peptide 1,356.8 03/01/2024: Hemoglobin 12.6; Platelets 395 03/16/2024: ALT 40; BUN 17; Creatinine, Ser 0.91; Potassium 3.5; Pro B Natriuretic peptide (BNP) 1,666.0; Sodium 140  Recent Lipid Panel    Component Value Date/Time   CHOL 139 03/16/2024 1550   TRIG 70.0 03/16/2024 1550   HDL 38.30 (L) 03/16/2024 1550   CHOLHDL 4 03/16/2024 1550   VLDL 14.0 03/16/2024 1550   LDLCALC 86 03/16/2024 1550    Physical Exam:    VS:  BP 124/88   Pulse (!) 105   Ht 5' 5 (1.651 m)   Wt 161 lb (73 kg)   LMP 10/03/2012   SpO2 99%   BMI 26.79 kg/m     Wt Readings from Last 3 Encounters:  03/28/24 161 lb (73 kg)  03/16/24 156 lb (70.8 kg)  03/01/24 154 lb 5.2 oz (70 kg)     GEN:  Well nourished, well developed in no acute distress HEENT: Normal NECK: No JVD; No carotid bruits LYMPHATICS: No lymphadenopathy CARDIAC: RRR, holosystolic murmur grade 1/6 to 2/6 best heard left border of sternum, no rubs, no gallops RESPIRATORY:  Clear to auscultation without rales, wheezing or rhonchi  ABDOMEN: Soft, non-tender, non-distended MUSCULOSKELETAL:  No edema; No deformity  SKIN: Warm and dry LOWER EXTREMITIES: 1+ swelling NEUROLOGIC:  Alert and oriented x 3 PSYCHIATRIC:  Normal affect   ASSESSMENT:    1. Dilated cardiomyopathy (HCC)   2. LBBB (left bundle branch block)   3. Nonrheumatic mitral (valve) insufficiency   4. Chronic combined systolic and diastolic congestive heart failure (HCC)   5. Type 2 diabetes mellitus with hyperglycemia, unspecified  whether long term insulin  use (HCC)    PLAN:    In order of problems listed above:  Dilated cardiomyopathy, she is already on guideline directed medical therapy.  But still evidence of overloading with fluid.  I will double the dose of Lasix  to 80 mg daily, will also increase dose of potassium from 20 mg daily to 30 mg daily.  Will check Chem-7 proBNP today will check Chem-7 proBNP 1 week from now I see him back in my office in about 2 weeks.  I will continue with Entresto  as well as Aldactone .  Will consider adding beta-blocker.  I think the first name of the game will be trying to diurese her and  then try to put on guideline directed medical therapy more aggressively, she is already on Jardiance  as well.  I will check echocardiogram as well to check left ventricle ejection fraction and degree of mitral regurgitation. Mitral regurgitation probably related to dilated cardiomyopathy, hopefully with improvement left ventricular ejection fraction much regurgitation will improve but so far we not making good progress she was been dealing this since May not much improvement.  Again will reassess and manage much more aggressively. Left bundle branch block, chronic noted. Congestive heart failure she is not mildly decompensated.  She is [place]  [employer] Association 2-3.   Medication Adjustments/Labs and Tests Ordered: Current medicines are reviewed at length with the patient today.  Concerns regarding medicines are outlined above.  No orders of the defined types were placed in this encounter.  Medication changes: No orders of the defined types were placed in this encounter.   Signed, Lamar DOROTHA Fitch, MD, [facility] 03/28/2024 8:33 AM    [facility]    [1]  Current Meds  Medication Sig   ACCU-CHEK GUIDE TEST test strip USE TO CHECK BLOOD SUGAR THREE TIMES DAILY   albuterol  (VENTOLIN  HFA) 108 (90 Base) MCG/ACT inhaler INHALE 1-2 PUFFS INTO THE LUNGS EVERY 4 HOURS AS NEEDED FOR  WHEEZING OR SHORTNESS OF BREATH.   BAYER LOW DOSE 81 MG tablet Take 81 mg by mouth in the morning. Swallow whole.   Blood Glucose Monitoring Suppl (BLOOD GLUCOSE MONITOR SYSTEM) w/Device KIT Use to check blood sugar as directed   cetirizine  (ZYRTEC ) 10 MG tablet TAKE 1 TABLET BY MOUTH EVERY DAY   Continuous Glucose Sensor (FREESTYLE LIBRE 3 PLUS SENSOR) MISC Change sensor every 15 days.   empagliflozin  (JARDIANCE ) 25 MG TABS tablet Take 1 tablet (25 mg total) by mouth daily before breakfast.   fluticasone  (FLONASE ) 50 MCG/ACT nasal spray Place 2 sprays into both nostrils daily.   furosemide  (LASIX ) 40 MG tablet Take 1 tablet (40 mg total) by mouth daily. Take for 3   insulin  aspart (NOVOLOG ) 100 UNIT/ML FlexPen Inject 0-6 Units into the skin 3 (three) times daily with meals. Check Blood Glucose (BG) and inject per scale: BG <150= 0 unit; BG 150-200= 1 unit; BG 201-250= 2 unit; BG 251-300= 3 unit; BG 301-350= 4 unit; BG 351-400= 5 unit; BG >400= 6 unit and Call Primary Care.   insulin  glargine (LANTUS  SOLOSTAR) 100 UNIT/ML Solostar Pen INJECT 15 UNITS INTO THE SKIN DAILY.   Insulin  Pen Needle (PEN NEEDLES) 31G X 5 MM MISC Use to inject insulin  3 times daily   Lancet Device MISC 1 each by Does not apply route 3 (three) times daily. May dispense any manufacturer covered by patient's insurance.   Lancets MISC Use to check blood sugar three times daily   mupirocin  ointment (BACTROBAN ) 2 % Apply 1 Application topically 2 (two) times daily.   omeprazole  (PRILOSEC) 20 MG capsule TAKE 1 CAPSULE BY MOUTH EVERY DAY   potassium chloride  SA (KLOR-CON  M) 20 MEQ tablet Take 1 tablet (20 mEq total) by mouth daily.   rosuvastatin  (CRESTOR ) 20 MG tablet Take 1 tablet (20 mg total) by mouth daily.   sacubitril -valsartan  (ENTRESTO ) 24-26 MG Take 1 tablet by mouth 2 (two) times daily.   spironolactone  (ALDACTONE ) 25 MG tablet Take 0.5 tablets (12.5 mg total) by mouth every morning.   "

## 2024-03-29 LAB — BASIC METABOLIC PANEL WITH GFR
BUN/Creatinine Ratio: 18 (ref 12–28)
BUN: 21 mg/dL (ref 8–27)
CO2: 23 mmol/L (ref 20–29)
Calcium: 9.3 mg/dL (ref 8.7–10.3)
Chloride: 98 mmol/L (ref 96–106)
Creatinine, Ser: 1.2 mg/dL — ABNORMAL HIGH (ref 0.57–1.00)
Glucose: 120 mg/dL — ABNORMAL HIGH (ref 70–99)
Potassium: 4.1 mmol/L (ref 3.5–5.2)
Sodium: 139 mmol/L (ref 134–144)
eGFR: 51 mL/min/1.73 — ABNORMAL LOW

## 2024-03-30 ENCOUNTER — Other Ambulatory Visit (HOSPITAL_COMMUNITY): Payer: Self-pay

## 2024-03-31 ENCOUNTER — Other Ambulatory Visit (HOSPITAL_COMMUNITY): Payer: Self-pay

## 2024-04-04 ENCOUNTER — Ambulatory Visit: Payer: Self-pay | Admitting: Cardiology

## 2024-04-04 DIAGNOSIS — R7989 Other specified abnormal findings of blood chemistry: Secondary | ICD-10-CM

## 2024-04-13 ENCOUNTER — Ambulatory Visit (HOSPITAL_BASED_OUTPATIENT_CLINIC_OR_DEPARTMENT_OTHER)
Admission: RE | Admit: 2024-04-13 | Discharge: 2024-04-13 | Disposition: A | Source: Ambulatory Visit | Attending: Cardiology | Admitting: Cardiology

## 2024-04-13 DIAGNOSIS — R0609 Other forms of dyspnea: Secondary | ICD-10-CM

## 2024-04-13 LAB — ECHOCARDIOGRAM COMPLETE
AR max vel: 1.5 cm2
AV Area VTI: 1.35 cm2
AV Area mean vel: 1.32 cm2
AV Mean grad: 2 mmHg
AV Peak grad: 3.3 mmHg
Ao pk vel: 0.9 m/s
Area-P 1/2: 6.37 cm2
Calc EF: 23 %
MV M vel: 5.04 m/s
MV Peak grad: 101.6 mmHg
MV VTI: 0.86 cm2
Radius: 0.5 cm
S' Lateral: 5 cm
Single Plane A2C EF: 26 %
Single Plane A4C EF: 20.1 %

## 2024-04-14 ENCOUNTER — Telehealth: Payer: Self-pay

## 2024-04-14 NOTE — Telephone Encounter (Signed)
 Left message on My Chart with Echo results per Dr. Karry note. Routed to PCP.

## 2024-04-14 NOTE — Telephone Encounter (Signed)
 Pt viewed Echo results on My Chart per Dr. Vanetta Shawl note. Routed to PCP.

## 2024-05-04 ENCOUNTER — Ambulatory Visit: Admitting: Cardiology

## 2024-06-02 ENCOUNTER — Ambulatory Visit: Admitting: Cardiology

## 2024-06-07 ENCOUNTER — Ambulatory Visit: Admitting: "Endocrinology
